# Patient Record
Sex: Male | Born: 1950 | Race: White | Hispanic: No | Smoking: Never smoker
Health system: Southern US, Community
[De-identification: ages and names within clinical notes are randomized; demographics above are authoritative.]

## PROBLEM LIST (undated history)

## (undated) DIAGNOSIS — I10 Essential (primary) hypertension: Secondary | ICD-10-CM

## (undated) DIAGNOSIS — E781 Pure hyperglyceridemia: Secondary | ICD-10-CM

## (undated) HISTORY — PX: NO PAST SURGERIES: SHX2092

---

## 2016-02-29 ENCOUNTER — Ambulatory Visit
Admission: EM | Admit: 2016-02-29 | Discharge: 2016-02-29 | Disposition: A | Discharge status: Home / Self Care | Payer: Medicare Other | Attending: Emergency Medicine | Admitting: Emergency Medicine

## 2016-02-29 DIAGNOSIS — H6123 Impacted cerumen, bilateral: Secondary | ICD-10-CM | POA: Diagnosis not present

## 2016-02-29 HISTORY — DX: Essential (primary) hypertension: I10

## 2016-02-29 HISTORY — DX: Pure hyperglyceridemia: E78.1

## 2016-02-29 NOTE — ED Triage Notes (Signed)
Patient complains of left ear fullness. Patient states that he can barely hear out of this ear. Patient denies pain. Patient states that this occurred this morning upon wakening.

## 2016-02-29 NOTE — ED Provider Notes (Signed)
[facility] ____________________________________________  Time seen: Approximately 5:28 PM  I have reviewed the triage vital signs and the nursing notes.   HISTORY  Chief Complaint Ear Fullness   HPI Pedro Mccoy is a 65 y.o. male presents with complaint of left ear fullness and clogged feeling that was present upon awakening today. Patient reports he has a history of cerumen impaction and states that his current sensation is consistent with the same. Denies any pain. Denies any fevers. Denies any recent cough, congestion, sore throat. Reports continues to eat and drink well. Denies any dizziness. Denies headache, tinnitus, neck pain, chest pain or shortness breath, drainage or other complaints. Patient reports feels well otherwise.   Past Medical History:  Diagnosis Date  . Hypertension   . Hypertriglyceridemia .    There are no active problems to display for this patient.   Past Surgical History:  Procedure Laterality Date  . NO PAST SURGERIES     No current facility-administered medications for this encounter.   Current Outpatient Prescriptions:  .  NIFEdipine (PROCARDIA-XL/ADALAT CC) 30 MG 24 hr tablet, Take 30 mg by mouth daily., Disp: , Rfl:  .  rosuvastatin (CRESTOR) 20 MG tablet, Take 20 mg by mouth daily., Disp: , Rfl:  .  telmisartan (MICARDIS) 20 MG tablet, Take 20 mg by mouth daily., Disp: , Rfl:   Allergies Ace inhibitors and Codeine  History reviewed. No pertinent family history.  Social History Social History  Substance Use Topics  . Smoking status: Never Smoker  . Smokeless tobacco: Never Used  . Alcohol use No    Review of Systems Constitutional: No fever/chills Eyes: No visual changes. ENT: No sore throat.As above. Cardiovascular: Denies chest pain. Respiratory: Denies shortness of breath. Gastrointestinal: No abdominal pain.  No nausea, no vomiting.  No diarrhea.  No constipation. Genitourinary: Negative for  dysuria. Musculoskeletal: Negative for back pain. Skin: Negative for rash. Neurological: Negative for headaches, focal weakness or numbness.  10-point ROS otherwise negative.  ____________________________________________   PHYSICAL EXAM:  VITAL SIGNS: ED Triage Vitals  Enc Vitals Group     BP 02/29/16 1644 119/80     Pulse Rate 02/29/16 1644 63     Resp 02/29/16 1644 16     Temp 02/29/16 1644 98.4 F (36.9 C)     Temp Source 02/29/16 1644 Oral     SpO2 02/29/16 1644 96 %     Weight 02/29/16 1643 225 lb (102.1 kg)     Height 02/29/16 1643 6\' 1"  (1.854 m)     Head Circumference --      Peak Flow --      Pain Score 02/29/16 1644 0     Pain Loc --      Pain Edu? --      Excl. in [place]? --     Constitutional: Alert and oriented. Well appearing and in no acute distress. Eyes: Conjunctivae are normal. PERRL. EOMI. Head: Atraumatic. No sinus tenderness to palpation. No swelling. No erythema.  Ears: Left: Total cerumen impaction present. Post irrigation and cerumen removal, canal clear, no erythema, normal TMs and nontender. Right: Moderate cerumen impaction present, no erythema, no exudate, normal TM appearance.  Nose: No nasal congestion or rhinorrhea.  Mouth/Throat: Mucous membranes are moist. No pharyngeal erythema. No tonsillar swelling or exudate.  Neck: No stridor.  No cervical spine tenderness to palpation. Hematological/Lymphatic/Immunilogical: No cervical lymphadenopathy. Cardiovascular: Normal rate, regular rhythm. Grossly normal heart sounds.  Good peripheral circulation. Respiratory: Normal respiratory effort.  No  retractions. Lungs CTAB. No wheezes, rales or rhonchi. Good air movement.  Gastrointestinal: Soft and nontender.  Musculoskeletal:  No cervical, thoracic or lumbar tenderness to palpation. Neurologic:  Normal speech and language. No gross focal neurologic deficits are appreciated. No gait instability. Skin:  Skin is warm, dry and intact. No rash  noted. Psychiatric: Mood and affect are normal. Speech and behavior are normal.  ___________________________________________   LABS (all labs ordered are listed, but only abnormal results are displayed)  Labs Reviewed - No data to display   PROCEDURES Procedures   Bilateral Ceruminosis is noted.  Wax is removed by irrigation. Instructions for home care to prevent wax buildup are given. __________________________________________   INITIAL IMPRESSION / ASSESSMENT AND PLAN / ED COURSE  Pertinent labs & imaging results that were available during my care of the patient were reviewed by me and considered in my medical decision making (see chart for details).   Well-appearing patient. No acute distress. Presents for complaint of left ear clogged sensation. Patient with noted bilateral cerumen impaction. Post irrigation, canals clear. Patient reports feeling much better. Patient reports clogged sensation has fully resolved. Encouraged supportive care.  Discussed follow up with Primary care physician this week. Discussed follow up and return parameters including no resolution or any worsening concerns. Patient verbalized understanding and agreed to plan.   ____________________________________________   FINAL CLINICAL IMPRESSION(S) / ED DIAGNOSES  Final diagnoses:  Cerumen impaction, bilateral     Discharge Medication List as of 02/29/2016  5:58 PM      Note: This dictation was prepared with Dragon dictation along with smaller phrase technology. Any transcriptional errors that result from this process are unintentional.    Clinical Course      Marylene Land, NP 03/02/16 2008

## 2017-03-04 ENCOUNTER — Ambulatory Visit: Payer: Medicare Other | Admitting: Physical Therapy

## 2017-03-09 ENCOUNTER — Ambulatory Visit: Payer: Medicare Other | Admitting: Physical Therapy

## 2017-09-04 ENCOUNTER — Emergency Department
Admission: EM | Admit: 2017-09-04 | Discharge: 2017-09-04 | Disposition: A | Discharge status: Home / Self Care | Payer: Medicare Other | Attending: Emergency Medicine | Admitting: Emergency Medicine

## 2017-09-04 ENCOUNTER — Other Ambulatory Visit: Payer: Self-pay

## 2017-09-04 DIAGNOSIS — Z79899 Other long term (current) drug therapy: Secondary | ICD-10-CM | POA: Insufficient documentation

## 2017-09-04 DIAGNOSIS — F329 Major depressive disorder, single episode, unspecified: Secondary | ICD-10-CM | POA: Diagnosis not present

## 2017-09-04 DIAGNOSIS — I10 Essential (primary) hypertension: Secondary | ICD-10-CM | POA: Diagnosis not present

## 2017-09-04 DIAGNOSIS — F32A Depression, unspecified: Secondary | ICD-10-CM

## 2017-09-04 DIAGNOSIS — F419 Anxiety disorder, unspecified: Secondary | ICD-10-CM | POA: Diagnosis present

## 2017-09-04 LAB — URINALYSIS, COMPLETE (UACMP) WITH MICROSCOPIC
BILIRUBIN URINE: NEGATIVE
Bacteria, UA: NONE SEEN
Glucose, UA: NEGATIVE mg/dL
Hgb urine dipstick: NEGATIVE
Ketones, ur: NEGATIVE mg/dL
Leukocytes, UA: NEGATIVE
Nitrite: NEGATIVE
Protein, ur: NEGATIVE mg/dL
RBC / HPF: NONE SEEN RBC/hpf (ref 0–5)
SPECIFIC GRAVITY, URINE: 1.003 — AB (ref 1.005–1.030)
SQUAMOUS EPITHELIAL / LPF: NONE SEEN
WBC UA: NONE SEEN WBC/hpf (ref 0–5)
pH: 7 (ref 5.0–8.0)

## 2017-09-04 LAB — URINE DRUG SCREEN, QUALITATIVE (ARMC ONLY)
Amphetamines, Ur Screen: NOT DETECTED
Barbiturates, Ur Screen: NOT DETECTED
Benzodiazepine, Ur Scrn: NOT DETECTED
CANNABINOID 50 NG, UR ~~LOC~~: NOT DETECTED
COCAINE METABOLITE, UR ~~LOC~~: NOT DETECTED
MDMA (ECSTASY) UR SCREEN: NOT DETECTED
Methadone Scn, Ur: NOT DETECTED
OPIATE, UR SCREEN: NOT DETECTED
PHENCYCLIDINE (PCP) UR S: NOT DETECTED
Tricyclic, Ur Screen: NOT DETECTED

## 2017-09-04 LAB — CBC WITH DIFFERENTIAL/PLATELET
BASOS ABS: 0.1 10*3/uL (ref 0–0.1)
Basophils Relative: 1 %
EOS PCT: 5 %
Eosinophils Absolute: 0.5 10*3/uL (ref 0–0.7)
HEMATOCRIT: 47.9 % (ref 40.0–52.0)
Hemoglobin: 16.5 g/dL (ref 13.0–18.0)
LYMPHS PCT: 17 %
Lymphs Abs: 1.6 10*3/uL (ref 1.0–3.6)
MCH: 28.9 pg (ref 26.0–34.0)
MCHC: 34.4 g/dL (ref 32.0–36.0)
MCV: 83.9 fL (ref 80.0–100.0)
MONO ABS: 0.7 10*3/uL (ref 0.2–1.0)
Monocytes Relative: 8 %
Neutro Abs: 6.8 10*3/uL — ABNORMAL HIGH (ref 1.4–6.5)
Neutrophils Relative %: 69 %
Platelets: 235 10*3/uL (ref 150–440)
RBC: 5.71 MIL/uL (ref 4.40–5.90)
RDW: 12.7 % (ref 11.5–14.5)
WBC: 9.8 10*3/uL (ref 3.8–10.6)

## 2017-09-04 LAB — COMPREHENSIVE METABOLIC PANEL
ALT: 26 U/L (ref 17–63)
AST: 26 U/L (ref 15–41)
Albumin: 4.2 g/dL (ref 3.5–5.0)
Alkaline Phosphatase: 81 U/L (ref 38–126)
Anion gap: 10 (ref 5–15)
BILIRUBIN TOTAL: 0.6 mg/dL (ref 0.3–1.2)
BUN: 12 mg/dL (ref 6–20)
CO2: 24 mmol/L (ref 22–32)
CREATININE: 0.95 mg/dL (ref 0.61–1.24)
Calcium: 9.2 mg/dL (ref 8.9–10.3)
Chloride: 101 mmol/L (ref 101–111)
Glucose, Bld: 142 mg/dL — ABNORMAL HIGH (ref 65–99)
Potassium: 3.9 mmol/L (ref 3.5–5.1)
Sodium: 135 mmol/L (ref 135–145)
TOTAL PROTEIN: 7.1 g/dL (ref 6.5–8.1)

## 2017-09-04 LAB — TROPONIN I: Troponin I: <0.03 ng/mL (ref <0.03)

## 2017-09-04 LAB — ETHANOL

## 2017-09-04 LAB — ACETAMINOPHEN LEVEL

## 2017-09-04 LAB — SALICYLATE LEVEL: Salicylate Lvl: <7 mg/dL (ref 2.8–30.0)

## 2017-09-04 MED ORDER — HYDROXYZINE HCL 25 MG PO TABS
50.0000 mg | ORAL_TABLET | Freq: Once | ORAL | Status: AC
  Administered 2017-09-04: 50 mg via ORAL
  Filled 2017-09-04: qty 2

## 2017-09-04 MED ORDER — LORAZEPAM 0.5 MG PO TABS: 0.5000 mg | ORAL_TABLET | ORAL | 0 refills | Status: DC | PRN

## 2017-09-04 NOTE — ED Triage Notes (Addendum)
Pt arrives to ED via [facility] from home with c/o anxiety. EMS states that pt originally called 911 with c/o lower abdominal pain x4 days, but also reported to EMS that the pain is only present "when the pt is alone". Pt states the pain goes away when he's "not by myself". Pt reports taking several medications for anxiety; no reports of SI/HI or AVH. Pt denies any c/o pain at this time; no c/o N/V/D, no fever, CP or SHOB.

## 2017-09-04 NOTE — ED Provider Notes (Signed)
[facility] Emergency Department Provider Note   ____________________________________________   First MD Initiated Contact with Patient 09/04/17 0202     (approximate)  I have reviewed the triage vital signs and the nursing notes.   HISTORY  Chief Complaint Anxiety    HPI Pedro Mccoy is a 67 y.o. male brought to the ED via EMS from home with a chief complaint of anxiety and depression.  Patient reports abdominal pain only when he closes his eyes.  This week is the anniversary of his mother's death and he is feeling particularly sad and anxious because he lives alone; wife died 5 years ago.  Denies active SI/HI/AH/VH.  Voices no complaints at this time.  Specifically, denies recent fever, chills, chest pain, shortness of breath, nausea, vomiting, dysuria, diarrhea.  Denies recent travel or trauma.   Past Medical History:  Diagnosis Date  . Hypertension   . Hypertriglyceridemia .    There are no active problems to display for this patient.   Past Surgical History:  Procedure Laterality Date  . NO PAST SURGERIES      Prior to Admission medications   Medication Sig Start Date End Date Taking? Authorizing Provider  NIFEdipine (PROCARDIA-XL/ADALAT CC) 30 MG 24 hr tablet Take 30 mg by mouth daily.    [provider]  rosuvastatin (CRESTOR) 20 MG tablet Take 20 mg by mouth daily.    [provider]  telmisartan (MICARDIS) 20 MG tablet Take 20 mg by mouth daily.    [provider]    Allergies Ace inhibitors and Codeine  No family history on file.  Social History Social History   Tobacco Use  . Smoking status: Never Smoker  . Smokeless tobacco: Never Used  Substance Use Topics  . Alcohol use: No  . Drug use: No    Review of Systems  Constitutional: No fever/chills. Eyes: No visual changes. ENT: No sore throat. Cardiovascular: Denies chest pain. Respiratory: Denies shortness of breath. Gastrointestinal: No  abdominal pain.  No nausea, no vomiting.  No diarrhea.  No constipation. Genitourinary: Negative for dysuria. Musculoskeletal: Negative for back pain. Skin: Negative for rash. Neurological: Negative for headaches, focal weakness or numbness. Psychiatric:Positive for anxiety and depression.  ____________________________________________   PHYSICAL EXAM:  VITAL SIGNS: ED Triage Vitals  Enc Vitals Group     BP 09/04/17 0051 (!) 161/93     Pulse Rate 09/04/17 0051 78     Resp 09/04/17 0051 20     Temp 09/04/17 0051 98.6 F (37 C)     Temp Source 09/04/17 0051 Oral     SpO2 09/04/17 0051 95 %     Weight 09/04/17 0050 235 lb (106.6 kg)     Height 09/04/17 0050 6\' 2"  (1.88 m)     Head Circumference --      Peak Flow --      Pain Score --      Pain Loc --      Pain Edu? --      Excl. in [place]? --     Constitutional: Alert and oriented.  Disheveled appearing and in no acute distress. Eyes: Conjunctivae are normal. PERRL. EOMI. Head: Atraumatic. Nose: No congestion/rhinnorhea. Mouth/Throat: Mucous membranes are moist.  Oropharynx non-erythematous. Neck: No stridor.  No carotid bruits. Cardiovascular: Normal rate, regular rhythm. Grossly normal heart sounds.  Good peripheral circulation. Respiratory: Normal respiratory effort.  No retractions. Lungs CTAB. Gastrointestinal: Soft and nontender to light and deep palpation. No distention. No abdominal bruits.  No CVA tenderness. Musculoskeletal: No lower extremity tenderness nor edema.  No joint effusions. Neurologic:  Normal speech and language. No gross focal neurologic deficits are appreciated. No gait instability. Skin:  Skin is warm, dry and intact. No rash noted. Psychiatric: Mood and affect are flat. Speech and behavior are normal.  ____________________________________________   LABS (all labs ordered are listed, but only abnormal results are displayed)  Labs Reviewed  CBC WITH DIFFERENTIAL/PLATELET - Abnormal; Notable for the  following components:      Result Value   Neutro Abs 6.8 (*)    All other components within normal limits  COMPREHENSIVE METABOLIC PANEL - Abnormal; Notable for the following components:   Glucose, Bld 142 (*)    All other components within normal limits  ACETAMINOPHEN LEVEL - Abnormal; Notable for the following components:   Acetaminophen (Tylenol), Serum <10 (*)    All other components within normal limits  URINALYSIS, COMPLETE (UACMP) WITH MICROSCOPIC - Abnormal; Notable for the following components:   Color, Urine STRAW (*)    APPearance CLEAR (*)    Specific Gravity, Urine 1.003 (*)    All other components within normal limits  ETHANOL  SALICYLATE LEVEL  TROPONIN I  URINE DRUG SCREEN, QUALITATIVE ([facility] ONLY)   ____________________________________________  EKG  ED ECG REPORT I, Charvez Voorhies J, the attending physician, personally viewed and interpreted this ECG.   Date: 09/04/2017  EKG Time: 0218  Rate: 69  Rhythm: normal EKG, normal sinus rhythm  Axis: LAD  Intervals:left anterior fascicular block  ST&T Change: Nonspecific  ____________________________________________  RADIOLOGY  ED MD interpretation: None  Official radiology report(s): No results found.  ____________________________________________   PROCEDURES  Procedure(s) performed: None  Procedures  Critical Care performed: No  ____________________________________________   INITIAL IMPRESSION / ASSESSMENT AND PLAN / ED COURSE  As part of my medical decision making, I reviewed the following data within the [place] notes reviewed and incorporated, Labs reviewed, EKG interpreted, Old chart reviewed, Radiograph reviewed, A consult was requested and obtained from this/these consultant(s) Psychiatry and Notes from prior ED visits   67 year old male with anxiety who presents with anxiety and depression surrounding the anniversary of his mother's death.  Voices no active  SI/HI/AH/VH.  No abdominal tenderness on examination.  Will obtain screening toxicological lab work and urine, consult [facility] and [facility] psychiatry for evaluations.  Patient currently takes Ativan at home; will administer Vistaril.  Clinical Course as of Sep 05 518  Fri Sep 04, 2017  0259 Laboratory results unremarkable.  At this time patient is medically cleared pending psychiatry evaluation.  He remains under voluntary status.  [JS]  9211 Patient was evaluated by [facility] psychiatrist Dr. Bobbe Medico who deems patient psychiatrically stable for discharge home with outpatient follow-up.  Patient does not meet criteria for IVC.  Recommends Ativan 0.5 mg every 4 hours for anxiety.  With strict return precautions given.  Patient verbalizes understanding and agrees with plan of care.  [JS]    Clinical Course User Index [JS] Paulette Blanch, MD     ____________________________________________   FINAL CLINICAL IMPRESSION(S) / ED DIAGNOSES  Final diagnoses:  Anxiety  Depression, unspecified depression type     ED Discharge Orders    None       Note:  This document was prepared using Dragon voice recognition software and may include unintentional dictation errors.    Paulette Blanch, MD 09/04/17 [phone]

## 2017-09-04 NOTE — Discharge Instructions (Signed)
You may take Ativan 0.5 mg every 4 hours as needed for anxiety.  Return to the ER for worsening symptoms, feelings of hurting yourself or others, or other concerns.

## 2017-09-04 NOTE — ED Notes (Signed)
SOC in progress at this time.  

## 2018-09-09 ENCOUNTER — Emergency Department: Payer: Medicare Other

## 2018-09-09 ENCOUNTER — Emergency Department
Admission: EM | Admit: 2018-09-09 | Discharge: 2018-09-10 | Disposition: A | Discharge status: Home / Self Care | Payer: Medicare Other | Attending: Emergency Medicine | Admitting: Emergency Medicine

## 2018-09-09 DIAGNOSIS — R4182 Altered mental status, unspecified: Secondary | ICD-10-CM | POA: Diagnosis present

## 2018-09-09 DIAGNOSIS — Z79899 Other long term (current) drug therapy: Secondary | ICD-10-CM | POA: Insufficient documentation

## 2018-09-09 DIAGNOSIS — Z008 Encounter for other general examination: Secondary | ICD-10-CM | POA: Insufficient documentation

## 2018-09-09 DIAGNOSIS — F203 Undifferentiated schizophrenia: Secondary | ICD-10-CM | POA: Diagnosis not present

## 2018-09-09 DIAGNOSIS — I10 Essential (primary) hypertension: Secondary | ICD-10-CM | POA: Diagnosis not present

## 2018-09-09 DIAGNOSIS — F209 Schizophrenia, unspecified: Secondary | ICD-10-CM | POA: Diagnosis present

## 2018-09-09 LAB — COMPREHENSIVE METABOLIC PANEL
ALK PHOS: 84 U/L (ref 38–126)
ALT: 20 U/L (ref 0–44)
AST: 21 U/L (ref 15–41)
Albumin: 4.5 g/dL (ref 3.5–5.0)
Anion gap: 10 (ref 5–15)
BILIRUBIN TOTAL: 0.7 mg/dL (ref 0.3–1.2)
BUN: 8 mg/dL (ref 8–23)
CALCIUM: 9 mg/dL (ref 8.9–10.3)
CO2: 24 mmol/L (ref 22–32)
CREATININE: 0.9 mg/dL (ref 0.61–1.24)
Chloride: 106 mmol/L (ref 98–111)
GFR calc non Af Amer: >60 mL/min (ref >60)
Glucose, Bld: 106 mg/dL — ABNORMAL HIGH (ref 70–99)
Potassium: 3.6 mmol/L (ref 3.5–5.1)
SODIUM: 140 mmol/L (ref 135–145)
Total Protein: 6.6 g/dL (ref 6.5–8.1)

## 2018-09-09 LAB — CBC
HEMATOCRIT: 46.8 % (ref 39.0–52.0)
HEMOGLOBIN: 15.9 g/dL (ref 13.0–17.0)
MCH: 28.9 pg (ref 26.0–34.0)
MCHC: 34 g/dL (ref 30.0–36.0)
MCV: 85.1 fL (ref 80.0–100.0)
Platelets: 214 10*3/uL (ref 150–400)
RBC: 5.5 MIL/uL (ref 4.22–5.81)
RDW: 12.4 % (ref 11.5–15.5)
WBC: 7.8 10*3/uL (ref 4.0–10.5)
nRBC: 0 % (ref 0.0–0.2)

## 2018-09-09 LAB — URINE DRUG SCREEN, QUALITATIVE (ARMC ONLY)
Amphetamines, Ur Screen: NOT DETECTED
BARBITURATES, UR SCREEN: NOT DETECTED
BENZODIAZEPINE, UR SCRN: NOT DETECTED
Cannabinoid 50 Ng, Ur ~~LOC~~: NOT DETECTED
Cocaine Metabolite,Ur ~~LOC~~: NOT DETECTED
MDMA (ECSTASY) UR SCREEN: NOT DETECTED
Methadone Scn, Ur: NOT DETECTED
Opiate, Ur Screen: NOT DETECTED
Phencyclidine (PCP) Ur S: NOT DETECTED
Tricyclic, Ur Screen: NOT DETECTED

## 2018-09-09 LAB — URINALYSIS, COMPLETE (UACMP) WITH MICROSCOPIC
Bacteria, UA: NONE SEEN
Bilirubin Urine: NEGATIVE
Glucose, UA: NEGATIVE mg/dL
HGB URINE DIPSTICK: NEGATIVE
KETONES UR: NEGATIVE mg/dL
LEUKOCYTE UA: NEGATIVE
NITRITE: NEGATIVE
PH: 6 (ref 5.0–8.0)
PROTEIN: NEGATIVE mg/dL
Specific Gravity, Urine: 1.004 — ABNORMAL LOW (ref 1.005–1.030)
Squamous Epithelial / LPF: NONE SEEN (ref 0–5)
WBC, UA: NONE SEEN WBC/hpf (ref 0–5)

## 2018-09-09 LAB — TROPONIN I: Troponin I: <0.03 ng/mL (ref <0.03)

## 2018-09-09 LAB — ETHANOL

## 2018-09-09 NOTE — ED Notes (Signed)
Called a number in pt phone on behalf of pt in attempt to find him a ride. No one answered.

## 2018-09-09 NOTE — ED Notes (Signed)
This RN called [employer] to request a welfare check. Asked them to go to pt home address and see if anyone was there that could come pick up the pt.

## 2018-09-09 NOTE — ED Provider Notes (Signed)
[facility] Emergency Department Provider Note  Time seen: 7:46 PM  I have reviewed the triage vital signs and the nursing notes.   HISTORY  Chief Complaint No chief complaint on file.    HPI Pedro Mccoy is a 68 y.o. male with a past medical history of hypertension, hyperlipidemia, diabetes, bipolar/schizoaffective, presents to the emergency department for possible altered mental status.  According EMS they were called by the patient's neighbor as the patient was wandering around his front yard.  They believe the patient to be acting abnormal however EMS states there is also another neighbor when they arrived he said that he appeared to be acting at his baseline per them.  Per care everywhere record review patient has bipolar and schizophrenia.  Here the patient overall appears well, slightly slurred speech, but is answering questions appropriately and following commands.  No acute distress.  Has no complaints at this time.  Patient states he lives with his parents.   Past Medical History:  Diagnosis Date  . Hypertension   . Hypertriglyceridemia .    There are no active problems to display for this patient.   Past Surgical History:  Procedure Laterality Date  . NO PAST SURGERIES      Prior to Admission medications   Medication Sig Start Date End Date Taking? Authorizing Provider  LORazepam (ATIVAN) 0.5 MG tablet Take 1 tablet (0.5 mg total) by mouth every 4 (four) hours as needed for anxiety. 09/04/17   Paulette Blanch, MD  NIFEdipine (PROCARDIA-XL/ADALAT CC) 30 MG 24 hr tablet Take 30 mg by mouth daily.    [provider]  rosuvastatin (CRESTOR) 20 MG tablet Take 20 mg by mouth daily.    [provider]  telmisartan (MICARDIS) 20 MG tablet Take 20 mg by mouth daily.    [provider]    Allergies  Allergen Reactions  . Ace Inhibitors   . Codeine     No family history on file.  Social History Social History   Tobacco Use   . Smoking status: Never Smoker  . Smokeless tobacco: Never Used  Substance Use Topics  . Alcohol use: No  . Drug use: No    Review of Systems Constitutional: Negative for fever Cardiovascular: Negative for chest pain. Respiratory: Negative for shortness of breath. Gastrointestinal: Negative for abdominal pain, vomiting and diarrhea. Genitourinary: Negative for urinary compaints Musculoskeletal: Negative for musculoskeletal complaints Skin: Negative for skin complaints  Neurological: Negative for headache All other ROS negative  ____________________________________________   PHYSICAL EXAM:  VITAL SIGNS: ED Triage Vitals  Enc Vitals Group     BP      Pulse      Resp      Temp      Temp src      SpO2      Weight      Height      Head Circumference      Peak Flow      Pain Score      Pain Loc      Pain Edu?      Excl. in [place]?     Constitutional: Patient is awake and alert, no distress. Eyes: Normal exam ENT   Head: Normocephalic and atraumatic   Mouth/Throat: Mucous membranes are moist. Cardiovascular: Normal rate, regular rhythm. No murmur Respiratory: Normal respiratory effort without tachypnea nor retractions. Breath sounds are clear  Gastrointestinal: Soft and nontender. No distention.   Musculoskeletal: Nontender with normal range of motion  in all extremities.  Neurologic: Patient does have somewhat slurred speech, unclear what his baseline is.  Answering questions and following commands appropriately.  Equal grip strengths, 5/5 motor in all extremities. Skin:  Skin is warm, dry and intact.  Psychiatric: Mood and affect are normal.   ____________________________________________    EKG  EKG viewed and interpreted by myself shows a normal sinus rhythm at 71 bpm with a narrow QRS, left axis deviation, largely normal intervals nonspecific ST changes.  ____________________________________________    RADIOLOGY  CT head negative for acute  abnormality.  ____________________________________________   INITIAL IMPRESSION / ASSESSMENT AND PLAN / ED COURSE  Pertinent labs & imaging results that were available during my care of the patient were reviewed by me and considered in my medical decision making (see chart for details).  Patient presents to the emergency department for possible altered mental status.  Currently the patient appears well he does have slightly slurred speech it is unclear if this is baseline.  Per record review on care everywhere patient is schizophrenia and bipolar disorder.  Patient states he lives with his parents.  However as no family is available for history nor are they listed besides an aunt we will check labs, urine and a CT scan head as a precaution.  I discussed this with the patient who is agreeable to this plan of care.  Patient's lab work is largely within normal limits, CT scan of the head is negative.  Patient continues to appear well, answering all questions appropriately, has no complaints at this time.  We are waiting for family members to arrive to verify patient's baseline and to help patient get back home.  Patient states he lives with his parents but we have not been able to reach them as of yet, we have left messages and are waiting to hear back.  ____________________________________________   FINAL CLINICAL IMPRESSION(S) / ED DIAGNOSES  Confusion   Harvest Dark, MD 09/09/18 2142

## 2018-09-09 NOTE — Discharge Instructions (Addendum)
Please follow-up with your doctor for recheck/reevaluation.  Return to the emergency department for any symptoms personally concerning to yourself.

## 2018-09-09 NOTE — ED Notes (Signed)
Called pt number we have on file to see if a family member could come get him. No one answered. This RN left a message.

## 2018-09-09 NOTE — ED Triage Notes (Signed)
Pt arrived from home via EMS with complaints of altered mental status. Pt neighbor called EMS stating that he was out walking around as if he was wondering and stated that his speech was slurred.  Pt denies any pain. EMS stated that their stroke screen was negative. VS per EMS  CBG-116 BP-144/90 HR-74 R-11. EMS placed 20 in left forearm.

## 2018-09-09 NOTE — ED Notes (Signed)
[employer] called back and let this RN know that they knocked several times and no one came to the door. MD made aware

## 2018-09-10 DIAGNOSIS — R4182 Altered mental status, unspecified: Secondary | ICD-10-CM | POA: Diagnosis present

## 2018-09-10 DIAGNOSIS — F203 Undifferentiated schizophrenia: Secondary | ICD-10-CM

## 2018-09-10 DIAGNOSIS — F209 Schizophrenia, unspecified: Secondary | ICD-10-CM | POA: Diagnosis present

## 2018-09-10 MED ORDER — NIFEDIPINE ER OSMOTIC RELEASE 30 MG PO TB24
30.0000 mg | ORAL_TABLET | Freq: Every day | ORAL | Status: DC
  Administered 2018-09-10: 30 mg via ORAL
  Filled 2018-09-10: qty 1

## 2018-09-10 MED ORDER — ALPRAZOLAM 0.5 MG PO TABS: 2.0000 mg | ORAL_TABLET | Freq: Every evening | ORAL | Status: DC | PRN

## 2018-09-10 MED ORDER — IRBESARTAN 75 MG PO TABS
75.0000 mg | ORAL_TABLET | Freq: Every day | ORAL | Status: DC
  Administered 2018-09-10: 75 mg via ORAL
  Filled 2018-09-10: qty 1

## 2018-09-10 MED ORDER — OMEGA-3-ACID ETHYL ESTERS 1 G PO CAPS
1.0000 g | ORAL_CAPSULE | Freq: Every day | ORAL | Status: DC
  Administered 2018-09-10: 1 g via ORAL
  Filled 2018-09-10: qty 1

## 2018-09-10 MED ORDER — ROSUVASTATIN CALCIUM 20 MG PO TABS
20.0000 mg | ORAL_TABLET | Freq: Every day | ORAL | Status: DC
  Filled 2018-09-10: qty 1

## 2018-09-10 MED ORDER — FLUOXETINE HCL 20 MG PO CAPS
20.0000 mg | ORAL_CAPSULE | Freq: Every day | ORAL | Status: DC
  Administered 2018-09-10: 20 mg via ORAL
  Filled 2018-09-10: qty 1

## 2018-09-10 NOTE — Consult Note (Signed)
[facility] Face-to-Face Psychiatry Consult   Reason for Consult: Consult to follow-up with this 68 year old man with schizophrenia who was brought to the hospital after being found acting strangely outdoors Referring Physician: Corky Downs Patient Identification: Pedro Mccoy MRN:  [id number] Principal Diagnosis: Schizophrenia [facility]) Diagnosis:  Principal Problem:   Schizophrenia ([place]) Active Problems:   Altered mental state   Total Time spent with patient: 1 hour  Subjective:   Pedro Mccoy is a 68 y.o. male patient admitted with "somebody called 911".  HPI: Patient seen chart reviewed.  History I got from the patient is consistent with what has been reported so far.  Somebody called 911 because they said the patient was outdoors acting strangely.  Patient cooperated with EMS and voluntarily came to the emergency room.  Since being here he has not been aggressive threatening violent or suicidal.  Patient tells me that all of this happened several days ago.  He is a little confused about the days and how long he has been here but otherwise gets the time course correct.  He tells me that people at his apartment complex were playing "adult hide and seek" outside.  He gives this as the reason why he was outdoors.  Patient denies any recent acute psychiatric symptoms.  He denies that his mood has been sad down or depressed.  Denies any auditory or visual hallucinations.  Denies any feelings of paranoia.  Denies suicidal or homicidal thoughts.  Patient states that he has not been drinking or using drugs.  He says he has been compliant with his prescribed medications and seeing his psychiatrist and therapist regularly.  Social history: Patient says he lives with his stepmother.  Multiple attempts have been made here to get someone to answer the phone or come to the door where he lives so far without success.  Last time he was evaluated in the emergency room he was living by himself.  He says the change has been  recent.  Patient says he is "dealing with grief" because of the death of his mother and his wife both of which happened more than a year ago.  Patient does not work outside the home.  Says that he spends most of his time "doing quiet things" like gardening.  Medical history: History of high blood pressure and elevated triglycerides  Substance abuse history: Denies alcohol or drug abuse no past history of substance abuse  Past Psychiatric History: Patient has a history of schizophrenia.  He tells me that he has had hospitalizations in the past but the most recent one was more than 10 years ago.  Denies any past suicide attempts.  Says he has been compliant with his prescriptions of Prozac and Haldol for years.  Risk to Self:   Risk to Others:   Prior Inpatient Therapy:   Prior Outpatient Therapy:    Past Medical History:  Past Medical History:  Diagnosis Date  . Hypertension   . Hypertriglyceridemia .    Past Surgical History:  Procedure Laterality Date  . NO PAST SURGERIES     Family History: History reviewed. No pertinent family history. Family Psychiatric  History: Denies any Social History:  Social History   Substance and Sexual Activity  Alcohol Use No     Social History   Substance and Sexual Activity  Drug Use No    Social History   Socioeconomic History  . Marital status: Widowed    Spouse name: Not on file  . Number of children: Not on file  .  Years of education: Not on file  . Highest education level: Not on file  Occupational History  . Not on file  Social Needs  . Financial resource strain: Not on file  . Food insecurity:    Worry: Not on file    Inability: Not on file  . Transportation needs:    Medical: Not on file    Non-medical: Not on file  Tobacco Use  . Smoking status: Never Smoker  . Smokeless tobacco: Never Used  Substance and Sexual Activity  . Alcohol use: No  . Drug use: No  . Sexual activity: Not on file  Lifestyle  . Physical  activity:    Days per week: Not on file    Minutes per session: Not on file  . Stress: Not on file  Relationships  . Social connections:    Talks on phone: Not on file    Gets together: Not on file    Attends religious service: Not on file    Active member of club or organization: Not on file    Attends meetings of clubs or organizations: Not on file    Relationship status: Not on file  Other Topics Concern  . Not on file  Social History Narrative  . Not on file   Additional Social History:    Allergies:   Allergies  Allergen Reactions  . Pioglitazone Nausea Only  . Ace Inhibitors   . Codeine   . Sitagliptin Nausea And Vomiting    Labs:  Results for orders placed or performed during the hospital encounter of 09/09/18 (from the past 48 hour(s))  Urine Drug Screen, Qualitative ([facility] only)     Status: None   Collection Time: 09/09/18  7:47 PM  Result Value Ref Range   Tricyclic, Ur Screen NONE DETECTED NONE DETECTED   Amphetamines, Ur Screen NONE DETECTED NONE DETECTED   MDMA (Ecstasy)Ur Screen NONE DETECTED NONE DETECTED   Cocaine Metabolite,Ur [place] NONE DETECTED NONE DETECTED   Opiate, Ur Screen NONE DETECTED NONE DETECTED   Phencyclidine (PCP) Ur S NONE DETECTED NONE DETECTED   Cannabinoid 50 Ng, Ur [place] NONE DETECTED NONE DETECTED   Barbiturates, Ur Screen NONE DETECTED NONE DETECTED   Benzodiazepine, Ur Scrn NONE DETECTED NONE DETECTED   Methadone Scn, Ur NONE DETECTED NONE DETECTED    Comment: (NOTE) Tricyclics + metabolites, urine    Cutoff 1000 ng/mL Amphetamines + metabolites, urine  Cutoff 1000 ng/mL MDMA (Ecstasy), urine              Cutoff 500 ng/mL Cocaine Metabolite, urine          Cutoff 300 ng/mL Opiate + metabolites, urine        Cutoff 300 ng/mL Phencyclidine (PCP), urine         Cutoff 25 ng/mL Cannabinoid, urine                 Cutoff 50 ng/mL Barbiturates + metabolites, urine  Cutoff 200 ng/mL Benzodiazepine, urine              Cutoff 200  ng/mL Methadone, urine                   Cutoff 300 ng/mL The urine drug screen provides only a preliminary, unconfirmed analytical test result and should not be used for non-medical purposes. Clinical consideration and professional judgment should be applied to any positive drug screen result due to possible interfering substances. A more specific alternate chemical method must be used in  order to obtain a confirmed analytical result. Gas chromatography / mass spectrometry (GC/MS) is the preferred confirmat ory method. Performed at [facility], [address] [place], [place] [address]   Urinalysis, Complete w Microscopic     Status: Abnormal   Collection Time: 09/09/18  7:47 PM  Result Value Ref Range   Color, Urine STRAW (A) YELLOW   APPearance CLEAR (A) CLEAR   Specific Gravity, Urine 1.004 (L) 1.005 - 1.030   pH 6.0 5.0 - 8.0   Glucose, UA NEGATIVE NEGATIVE mg/dL   Hgb urine dipstick NEGATIVE NEGATIVE   Bilirubin Urine NEGATIVE NEGATIVE   Ketones, ur NEGATIVE NEGATIVE mg/dL   Protein, ur NEGATIVE NEGATIVE mg/dL   Nitrite NEGATIVE NEGATIVE   Leukocytes,Ua NEGATIVE NEGATIVE   RBC / HPF 0-5 0 - 5 RBC/hpf   WBC, UA NONE SEEN 0 - 5 WBC/hpf   Bacteria, UA NONE SEEN NONE SEEN   Squamous Epithelial / LPF NONE SEEN 0 - 5    Comment: Performed at [facility], [address] [place], [place] [address]  CBC     Status: None   Collection Time: 09/09/18  7:51 PM  Result Value Ref Range   WBC 7.8 4.0 - 10.5 K/uL   RBC 5.50 4.22 - 5.81 MIL/uL   Hemoglobin 15.9 13.0 - 17.0 g/dL   HCT 46.8 39.0 - 52.0 %   MCV 85.1 80.0 - 100.0 fL   MCH 28.9 26.0 - 34.0 pg   MCHC 34.0 30.0 - 36.0 g/dL   RDW 12.4 11.5 - 15.5 %   Platelets 214 150 - 400 K/uL   nRBC 0.0 0.0 - 0.2 %    Comment: Performed at [facility], [address] [place], [place] [address]  Comprehensive metabolic panel     Status: Abnormal   Collection Time: 09/09/18  7:51 PM  Result Value Ref  Range   Sodium 140 135 - 145 mmol/L   Potassium 3.6 3.5 - 5.1 mmol/L   Chloride 106 98 - 111 mmol/L   CO2 24 22 - 32 mmol/L   Glucose, Bld 106 (H) 70 - 99 mg/dL   BUN 8 8 - 23 mg/dL   Creatinine, Ser 0.90 0.61 - 1.24 mg/dL   Calcium 9.0 8.9 - 10.3 mg/dL   Total Protein 6.6 6.5 - 8.1 g/dL   Albumin 4.5 3.5 - 5.0 g/dL   AST 21 15 - 41 U/L   ALT 20 0 - 44 U/L   Alkaline Phosphatase 84 38 - 126 U/L   Total Bilirubin 0.7 0.3 - 1.2 mg/dL   GFR calc non Af Amer >60 >60 mL/min   GFR calc Af Amer >60 >60 mL/min   Anion gap 10 5 - 15    Comment: Performed at [facility], [address] [place], [place] [address]  Troponin I - ONCE - STAT     Status: None   Collection Time: 09/09/18  7:51 PM  Result Value Ref Range   Troponin I <0.03 <0.03 ng/mL    Comment: Performed at [facility], [address] [place], [place] [address]  Ethanol     Status: None   Collection Time: 09/09/18  7:51 PM  Result Value Ref Range   Alcohol, Ethyl (B) <10 <10 mg/dL    Comment: (NOTE) Lowest detectable limit for serum alcohol is 10 mg/dL. For medical purposes only. Performed at [facility], [address] [place], [place] [address]     Current  Facility-Administered Medications  Medication Dose Route Frequency Provider Last Rate Last Dose  . ALPRAZolam Duanne Moron) tablet 2 mg  2 mg Oral QHS PRN Merlyn Lot, MD      . FLUoxetine (PROZAC) capsule 20 mg  20 mg Oral Daily Merlyn Lot, MD   20 mg at 09/10/18 1100  . irbesartan (AVAPRO) tablet 75 mg  75 mg Oral Daily Merlyn Lot, MD   75 mg at 09/10/18 1100  . NIFEdipine (PROCARDIA-XL/NIFEDICAL-XL) 24 hr tablet 30 mg  30 mg Oral Daily Merlyn Lot, MD   30 mg at 09/10/18 1100  . omega-3 acid ethyl esters (LOVAZA) capsule 1 g  1 g Oral Daily Merlyn Lot, MD   1 g at 09/10/18 1100  . rosuvastatin (CRESTOR) tablet 20 mg  20 mg Oral Daily Merlyn Lot, MD       Current Outpatient Medications   Medication Sig Dispense Refill  . alprazolam (XANAX) 2 MG tablet Take 2 mg by mouth at bedtime as needed for sleep.    Marland Kitchen FLUoxetine (PROZAC) 20 MG capsule Take 20 mg by mouth daily.    Marland Kitchen NIFEdipine (PROCARDIA-XL/ADALAT CC) 30 MG 24 hr tablet Take 30 mg by mouth daily.    Marland Kitchen omega-3 acid ethyl esters (LOVAZA) 1 g capsule Take 1 g by mouth daily.    . rosuvastatin (CRESTOR) 20 MG tablet Take 20 mg by mouth daily.    Marland Kitchen telmisartan (MICARDIS) 20 MG tablet Take 20 mg by mouth daily.      Musculoskeletal: Strength & Muscle Tone: within normal limits Gait & Station: normal Patient leans: N/A  Psychiatric Specialty Exam: Physical Exam  Nursing note and vitals reviewed. Constitutional: He appears well-developed and well-nourished.  HENT:  Head: Normocephalic and atraumatic.  Eyes: Pupils are equal, round, and reactive to light. Conjunctivae are normal.  Neck: Normal range of motion.  Cardiovascular: Regular rhythm and normal heart sounds.  Respiratory: Effort normal. No respiratory distress.  GI: Soft.  Musculoskeletal: Normal range of motion.  Neurological: He is alert.  Skin: Skin is warm and dry.  Psychiatric: He has a normal mood and affect. His speech is normal and behavior is normal. Judgment and thought content normal. He exhibits abnormal recent memory.    Review of Systems  Constitutional: Negative.   HENT: Negative.   Eyes: Negative.   Respiratory: Negative.   Cardiovascular: Negative.   Gastrointestinal: Negative.   Musculoskeletal: Negative.   Skin: Negative.   Neurological: Negative.   Psychiatric/Behavioral: Negative for depression, hallucinations, memory loss, substance abuse and suicidal ideas. The patient is not nervous/anxious and does not have insomnia.     Blood pressure (!) 168/103, pulse 69, temperature 97.7 F (36.5 C), temperature source Oral, resp. rate 18, height 6\' 2"  (1.88 m), weight 97.5 kg, SpO2 100 %.Body mass index is 27.6 kg/m.  General  Appearance: Casual  Eye Contact:  Good  Speech:  Clear and Coherent  Volume:  Normal  Mood:  Euthymic  Affect:  Congruent  Thought Process:  Goal Directed  Orientation:  Full (Time, Place, and Person)  Thought Content:  Logical  Suicidal Thoughts:  No  Homicidal Thoughts:  No  Memory:  Immediate;   Fair Recent;   Fair Remote;   Fair  Judgement:  Fair  Insight:  Fair  Psychomotor Activity:  Normal  Concentration:  Concentration: Fair  Recall:  [employer] of Knowledge:  Fair  Language:  Fair  Akathisia:  No  Handed:  Right  AIMS (if indicated):  Assets:  Desire for Improvement Housing Physical Health Resilience  ADL's:  Intact  Cognition:  WNL  Sleep:        Treatment Plan Summary: Daily contact with patient to assess and evaluate symptoms and progress in treatment, Medication management and Plan Patient is currently not exhibiting any signs of psychotic thinking or behavior.  Does not appear to be depressed or manic.  No evidence of any dangerous thinking or behavior.  Even if under the worst interpretation he was confused or even delusional about people playing "adult hide and seek" outdoors there does not seem to be anything here that would justify involuntary commitment.  Indeed the patient is not under involuntary commitment.  He tells me that he does not want to be admitted to a psychiatric ward but would prefer to go home.  He agrees to continue outpatient psychiatric treatment at [facility] and current medicine.  Under current circumstances there does not seem to be any justification for keeping him in the ER.  Case reviewed with emergency room doctor.  Disposition: No evidence of imminent risk to self or others at present.   Patient does not meet criteria for psychiatric inpatient admission. Supportive therapy provided about ongoing stressors. Discussed crisis plan, support from social network, calling 911, coming to the Emergency Department, and  calling Suicide Hotline.  Alethia Berthold, MD 09/10/2018 2:11 PM

## 2018-09-10 NOTE — BH Assessment (Signed)
Pt currently under review with [facility]

## 2018-09-10 NOTE — ED Notes (Signed)
Patients blue Guess watch placed in belongings bag

## 2018-09-10 NOTE — ED Notes (Signed)
Patient discharged home, patient received discharge papers. Patient received belongings and verbalized he has received all of his belongings. Patient appropriate and cooperative, Denies SI/HI AVH. Vital signs taken. NAD noted. 

## 2018-09-10 NOTE — BH Assessment (Signed)
Pt has been faxed out to several [facility] facilities. TTS will follow up to determine disposition and placement.

## 2018-09-10 NOTE — ED Provider Notes (Signed)
Cleared for d/c by dr. Bethena Midget, MD 09/10/18 1245

## 2018-09-10 NOTE — ED Notes (Signed)
Gave pt warm blanket and tucked in bed.

## 2018-09-10 NOTE — ED Notes (Signed)
Patient was given orange juice at this time.

## 2018-09-10 NOTE — Consult Note (Signed)
[place] [facility] Consult   Reason for Consult: Altered mental status Referring Physician:  Dr. Kerman Passey Patient Identification: Pedro Mccoy MRN:  [id number] Principal Diagnosis: Altered mental state Diagnosis:  Principal Problem:   Altered mental state   Total Time spent with patient: 1 hour  Subjective:   Pedro Mccoy is a 68 y.o. male patient presented to [facility] ED via EMS the patient was seen face-to-face by this provider; chart reviewed and consulted with Dr. Owens Shark on 09/10/2018 due to the care of the patient. It was discussed with ED provider that the patient dose meet criteria to be admitted to [facility] inpatient unit once a bed becomes available. The patient is experiencing altered mental status changes which makes him unsafe to live independently at this moment.  During his assessment when questions are posed to the patient he is unable to answer appropriately.  The patient is consistently derailing from questions which has been asked.  The patient states he was at a party and they were all put in a "padi-wagon" and brought "here".  This provider is unable to follow his reality which is a concern allowing him to be discharge. During the patient evaluation, he is alert and oriented x 1-2, calm and cooperative, and mood-congruent with affect. The patient does not appear to be responding to internal or external stimuli. At times his answers seem delusional. The patient denies any suicidal, homicidal, or self-harm ideations. The patient is not presenting with any psychotic or paranoid behaviors. The patient is unable to answer questions appropriately. When answering questions he is derailing from what is being asked.  Collateral was not obtained ED RN called [phone] and a message was left. TTS can follow-up in the morning to confirm if patient lives with his dad and step-mom.  Per Ms. Jennet Maduro RN-ED; Called pt number we have on file to see if a family member could come get him. No  one answered. This RN left a message. Pt arrived from home via EMS with complaints of altered mental status. Pt neighbor called EMS stating that he was out walking around as if he was wondering and stated that his speech was slurred.  Pt denies any pain. EMS stated that their stroke screen was negative. . The plan the patient is a safety risk, needs inpatient stabilization and treatment.  HPI:  Per Dr. Kerman Passey; Pedro Mccoy is a 68 y.o. male with a past medical history of hypertension, hyperlipidemia, diabetes, bipolar/schizoaffective, presents to the emergency department for possible altered mental status.  According EMS they were called by the patient's neighbor as the patient was wandering around his front yard.  They believe the patient to be acting abnormal however EMS states there is also another neighbor when they arrived he said that he appeared to be acting at his baseline per them.  Per care everywhere record review patient has bipolar and schizophrenia.  Here the patient overall appears well, slightly slurred speech, but is answering questions appropriately and following commands.  No acute distress.  Has no complaints at this time.  Patient states he lives with his parents.  Past Psychiatric History: Bipolar Schizoaffective  Risk to Self:  No Risk to Others:  No Prior Inpatient Therapy:  Unknown Prior Outpatient Therapy:   Unknown  Past Medical History:  Past Medical History:  Diagnosis Date  . Hypertension   . Hypertriglyceridemia .    Past Surgical History:  Procedure Laterality Date  . NO PAST SURGERIES     Family History: History reviewed.  No pertinent family history. Family Psychiatric  History:  Patient unable to provide information Social History:  Social History   Substance and Sexual Activity  Alcohol Use No     Social History   Substance and Sexual Activity  Drug Use No    Social History   Socioeconomic History  . Marital status: Widowed    Spouse  name: Not on file  . Number of children: Not on file  . Years of education: Not on file  . Highest education level: Not on file  Occupational History  . Not on file  Social Needs  . Financial resource strain: Not on file  . Food insecurity:    Worry: Not on file    Inability: Not on file  . Transportation needs:    Medical: Not on file    Non-medical: Not on file  Tobacco Use  . Smoking status: Never Smoker  . Smokeless tobacco: Never Used  Substance and Sexual Activity  . Alcohol use: No  . Drug use: No  . Sexual activity: Not on file  Lifestyle  . Physical activity:    Days per week: Not on file    Minutes per session: Not on file  . Stress: Not on file  Relationships  . Social connections:    Talks on phone: Not on file    Gets together: Not on file    Attends religious service: Not on file    Active member of club or organization: Not on file    Attends meetings of clubs or organizations: Not on file    Relationship status: Not on file  Other Topics Concern  . Not on file  Social History Narrative  . Not on file   Additional Social History:    Allergies:   Allergies  Allergen Reactions  . Pioglitazone Nausea Only  . Ace Inhibitors   . Codeine   . Sitagliptin Nausea And Vomiting    Labs:  Results for orders placed or performed during the hospital encounter of 09/09/18 (from the past 48 hour(s))  Urine Drug Screen, Qualitative ([facility] only)     Status: None   Collection Time: 09/09/18  7:47 PM  Result Value Ref Range   Tricyclic, Ur Screen NONE DETECTED NONE DETECTED   Amphetamines, Ur Screen NONE DETECTED NONE DETECTED   MDMA (Ecstasy)Ur Screen NONE DETECTED NONE DETECTED   Cocaine Metabolite,Ur [place] NONE DETECTED NONE DETECTED   Opiate, Ur Screen NONE DETECTED NONE DETECTED   Phencyclidine (PCP) Ur S NONE DETECTED NONE DETECTED   Cannabinoid 50 Ng, Ur [place] NONE DETECTED NONE DETECTED   Barbiturates, Ur Screen NONE DETECTED NONE DETECTED   Benzodiazepine,  Ur Scrn NONE DETECTED NONE DETECTED   Methadone Scn, Ur NONE DETECTED NONE DETECTED    Comment: (NOTE) Tricyclics + metabolites, urine    Cutoff 1000 ng/mL Amphetamines + metabolites, urine  Cutoff 1000 ng/mL MDMA (Ecstasy), urine              Cutoff 500 ng/mL Cocaine Metabolite, urine          Cutoff 300 ng/mL Opiate + metabolites, urine        Cutoff 300 ng/mL Phencyclidine (PCP), urine         Cutoff 25 ng/mL Cannabinoid, urine                 Cutoff 50 ng/mL Barbiturates + metabolites, urine  Cutoff 200 ng/mL Benzodiazepine, urine  Cutoff 200 ng/mL Methadone, urine                   Cutoff 300 ng/mL The urine drug screen provides only a preliminary, unconfirmed analytical test result and should not be used for non-medical purposes. Clinical consideration and professional judgment should be applied to any positive drug screen result due to possible interfering substances. A more specific alternate chemical method must be used in order to obtain a confirmed analytical result. Gas chromatography / mass spectrometry (GC/MS) is the preferred confirmat ory method. Performed at [facility], [address] [place], [place] [address]   Urinalysis, Complete w Microscopic     Status: Abnormal   Collection Time: 09/09/18  7:47 PM  Result Value Ref Range   Color, Urine STRAW (A) YELLOW   APPearance CLEAR (A) CLEAR   Specific Gravity, Urine 1.004 (L) 1.005 - 1.030   pH 6.0 5.0 - 8.0   Glucose, UA NEGATIVE NEGATIVE mg/dL   Hgb urine dipstick NEGATIVE NEGATIVE   Bilirubin Urine NEGATIVE NEGATIVE   Ketones, ur NEGATIVE NEGATIVE mg/dL   Protein, ur NEGATIVE NEGATIVE mg/dL   Nitrite NEGATIVE NEGATIVE   Leukocytes,Ua NEGATIVE NEGATIVE   RBC / HPF 0-5 0 - 5 RBC/hpf   WBC, UA NONE SEEN 0 - 5 WBC/hpf   Bacteria, UA NONE SEEN NONE SEEN   Squamous Epithelial / LPF NONE SEEN 0 - 5    Comment: Performed at [facility], [address] [place], [place] [address]   CBC     Status: None   Collection Time: 09/09/18  7:51 PM  Result Value Ref Range   WBC 7.8 4.0 - 10.5 K/uL   RBC 5.50 4.22 - 5.81 MIL/uL   Hemoglobin 15.9 13.0 - 17.0 g/dL   HCT 46.8 39.0 - 52.0 %   MCV 85.1 80.0 - 100.0 fL   MCH 28.9 26.0 - 34.0 pg   MCHC 34.0 30.0 - 36.0 g/dL   RDW 12.4 11.5 - 15.5 %   Platelets 214 150 - 400 K/uL   nRBC 0.0 0.0 - 0.2 %    Comment: Performed at [facility], [address] [place], [place] [address]  Comprehensive metabolic panel     Status: Abnormal   Collection Time: 09/09/18  7:51 PM  Result Value Ref Range   Sodium 140 135 - 145 mmol/L   Potassium 3.6 3.5 - 5.1 mmol/L   Chloride 106 98 - 111 mmol/L   CO2 24 22 - 32 mmol/L   Glucose, Bld 106 (H) 70 - 99 mg/dL   BUN 8 8 - 23 mg/dL   Creatinine, Ser 0.90 0.61 - 1.24 mg/dL   Calcium 9.0 8.9 - 10.3 mg/dL   Total Protein 6.6 6.5 - 8.1 g/dL   Albumin 4.5 3.5 - 5.0 g/dL   AST 21 15 - 41 U/L   ALT 20 0 - 44 U/L   Alkaline Phosphatase 84 38 - 126 U/L   Total Bilirubin 0.7 0.3 - 1.2 mg/dL   GFR calc non Af Amer >60 >60 mL/min   GFR calc Af Amer >60 >60 mL/min   Anion gap 10 5 - 15    Comment: Performed at [facility], [address] [place],  [address]  Troponin I - ONCE - STAT     Status: None   Collection Time: 09/09/18  7:51 PM  Result Value Ref Range   Troponin I <0.03 <0.03 ng/mL    Comment: Performed  at [facility], [address] [place], [place] [address]  Ethanol     Status: None   Collection Time: 09/09/18  7:51 PM  Result Value Ref Range   Alcohol, Ethyl (B) <10 <10 mg/dL    Comment: (NOTE) Lowest detectable limit for serum alcohol is 10 mg/dL. For medical purposes only. Performed at [facility], [address] [place], [place] [address]     Current Facility-Administered Medications  Medication Dose Route Frequency Provider Last Rate Last Dose  . ALPRAZolam Duanne Moron) tablet 2 mg  2 mg Oral QHS PRN Merlyn Lot, MD       . FLUoxetine (PROZAC) capsule 20 mg  20 mg Oral Daily Merlyn Lot, MD      . irbesartan (AVAPRO) tablet 75 mg  75 mg Oral Daily Merlyn Lot, MD      . NIFEdipine (PROCARDIA-XL/NIFEDICAL-XL) 24 hr tablet 30 mg  30 mg Oral Daily Merlyn Lot, MD      . omega-3 acid ethyl esters (LOVAZA) capsule 1 g  1 g Oral Daily Merlyn Lot, MD      . rosuvastatin (CRESTOR) tablet 20 mg  20 mg Oral Daily Merlyn Lot, MD       Current Outpatient Medications  Medication Sig Dispense Refill  . alprazolam (XANAX) 2 MG tablet Take 2 mg by mouth at bedtime as needed for sleep.    Marland Kitchen FLUoxetine (PROZAC) 20 MG capsule Take 20 mg by mouth daily.    Marland Kitchen NIFEdipine (PROCARDIA-XL/ADALAT CC) 30 MG 24 hr tablet Take 30 mg by mouth daily.    Marland Kitchen omega-3 acid ethyl esters (LOVAZA) 1 g capsule Take 1 g by mouth daily.    . rosuvastatin (CRESTOR) 20 MG tablet Take 20 mg by mouth daily.    Marland Kitchen telmisartan (MICARDIS) 20 MG tablet Take 20 mg by mouth daily.      Musculoskeletal: Strength & Muscle Tone: decreased Gait & Station: unsteady Patient leans: N/A  Psychiatric Specialty Exam: Physical Exam  Nursing note and vitals reviewed. Constitutional: He appears well-developed and well-nourished.  HENT:  Head: Normocephalic and atraumatic.  Eyes: Pupils are equal, round, and reactive to light. Conjunctivae and EOM are normal.  Neck: Normal range of motion. Neck supple.  Cardiovascular: Normal rate and regular rhythm.  Respiratory: Effort normal.  Musculoskeletal: Normal range of motion.  Neurological: He is alert.  Skin: Skin is warm and dry.    Review of Systems  Constitutional: Negative.   HENT: Negative.   Eyes: Negative.   Respiratory: Negative.   Cardiovascular: Negative.   Gastrointestinal: Negative.   Genitourinary: Negative.   Musculoskeletal: Negative.   Skin: Negative.   Neurological: Positive for tremors.  Endo/Heme/Allergies: Negative.   Psychiatric/Behavioral: Negative  for depression, hallucinations, memory loss, substance abuse and suicidal ideas. The patient is nervous/anxious. The patient does not have insomnia.     Blood pressure (!) 171/103, pulse 72, temperature 97.8 F (36.6 C), temperature source Oral, resp. rate 18, height 6\' 2"  (1.88 m), weight 97.5 kg, SpO2 100 %.Body mass index is 27.6 kg/m.  General Appearance: Disheveled  Eye Contact:  Good  Speech:  Garbled  Volume:  Decreased  Mood:  Calm  Affect:  Appropriate and Blunt  Thought Process:  Coherent and Linear  Orientation:  Other:  Alert and oriented to self  Thought Content:  Illogical and Tangential  Suicidal Thoughts:  No  Homicidal Thoughts:  No  Memory:  Recent;   Poor  Judgement:  Poor  Insight:  Lacking  Psychomotor Activity:  Decreased  Concentration:  Concentration: Poor  Recall:  Poor  Fund of Knowledge:  Poor  Language:  Fair  Akathisia:  NA  Handed:  Right  AIMS (if indicated):     Assets:  Communication Skills Housing Social Support  ADL's:  Intact  Cognition:  Impaired,  Moderate  Sleep:   unable to assess     Treatment Plan Summary: Daily contact with patient to assess and evaluate symptoms and progress in treatment, Medication management and Plan Patient needs to be admitted to a Geropsych inpatient unit   Disposition: Supportive therapy provided about ongoing stressors. Discussed crisis plan, support from social network, calling 911, coming to the Emergency Department, and calling Suicide Hotline.  Patient does meet criteria for psychiatric inpatient admission  Lamont Dowdy, NP 09/10/2018 2:59 AM

## 2018-09-10 NOTE — BH Assessment (Signed)
Assessment Note  Pedro Mccoy is an 67 y.o. male who presents to the ED with c/o an altered mental status. Per triage ED RN:  Pt arrived from home via EMS with complaints of altered mental status. Pt neighbor called EMS stating that he was out walking around as if he was wondering and stated that his speech was slurred.  Pt denies any pain. EMS stated that their stroke screen was negative. VS per EMS  CBG-116 BP-144/90 HR-74 R-11. EMS placed 20 in left forearm.          Diagnosis: Altered Mental Statru  Past Medical History:  Past Medical History:  Diagnosis Date  . Hypertension   . Hypertriglyceridemia .    Past Surgical History:  Procedure Laterality Date  . NO PAST SURGERIES      Family History: History reviewed. No pertinent family history.  Social History:  reports that he has never smoked. He has never used smokeless tobacco. He reports that he does not drink alcohol or use drugs.  Additional Social History:     CIWA: CIWA-Ar BP: (!) 171/103 Pulse Rate: 72 COWS:    Allergies:  Allergies  Allergen Reactions  . Pioglitazone Nausea Only  . Ace Inhibitors   . Codeine   . Sitagliptin Nausea And Vomiting    Home Medications: (Not in a hospital admission)   OB/GYN Status:  No LMP for male patient.  General Assessment Data Assessment unable to be completed: Yes Reason for not completing assessment: Pt has an altered mental status and is not appropriate for assessment.                                                  Advance Directives (For Healthcare) Does Patient Have a Medical Advance Directive?: No          Disposition:     On Site Evaluation by:   Reviewed with Physician:    Kennede Lusk D Goebel Hellums 09/10/2018 4:52 AM

## 2018-11-14 ENCOUNTER — Other Ambulatory Visit: Payer: Self-pay

## 2018-11-14 ENCOUNTER — Emergency Department: Payer: Medicare Other

## 2018-11-14 ENCOUNTER — Emergency Department
Admission: EM | Admit: 2018-11-14 | Discharge: 2018-11-14 | Discharge status: Against Medical Advice | Payer: Medicare Other | Attending: Internal Medicine | Admitting: Internal Medicine

## 2018-11-14 DIAGNOSIS — J181 Lobar pneumonia, unspecified organism: Secondary | ICD-10-CM | POA: Insufficient documentation

## 2018-11-14 DIAGNOSIS — R296 Repeated falls: Secondary | ICD-10-CM | POA: Diagnosis not present

## 2018-11-14 DIAGNOSIS — Z79899 Other long term (current) drug therapy: Secondary | ICD-10-CM | POA: Insufficient documentation

## 2018-11-14 DIAGNOSIS — Z1159 Encounter for screening for other viral diseases: Secondary | ICD-10-CM | POA: Insufficient documentation

## 2018-11-14 DIAGNOSIS — R531 Weakness: Secondary | ICD-10-CM | POA: Diagnosis not present

## 2018-11-14 DIAGNOSIS — Z5320 Procedure and treatment not carried out because of patient's decision for unspecified reasons: Secondary | ICD-10-CM | POA: Diagnosis not present

## 2018-11-14 DIAGNOSIS — R4182 Altered mental status, unspecified: Secondary | ICD-10-CM | POA: Diagnosis present

## 2018-11-14 DIAGNOSIS — I10 Essential (primary) hypertension: Secondary | ICD-10-CM | POA: Diagnosis not present

## 2018-11-14 DIAGNOSIS — J189 Pneumonia, unspecified organism: Secondary | ICD-10-CM

## 2018-11-14 LAB — COMPREHENSIVE METABOLIC PANEL
ALT: 21 U/L (ref 0–44)
AST: 20 U/L (ref 15–41)
Albumin: 4.2 g/dL (ref 3.5–5.0)
Alkaline Phosphatase: 111 U/L (ref 38–126)
Anion gap: 12 (ref 5–15)
BUN: 10 mg/dL (ref 8–23)
CO2: 20 mmol/L — ABNORMAL LOW (ref 22–32)
Calcium: 9 mg/dL (ref 8.9–10.3)
Chloride: 101 mmol/L (ref 98–111)
Creatinine, Ser: 0.92 mg/dL (ref 0.61–1.24)
GFR calc Af Amer: >60 mL/min (ref >60)
GFR calc non Af Amer: >60 mL/min (ref >60)
Glucose, Bld: 177 mg/dL — ABNORMAL HIGH (ref 70–99)
Potassium: 4.5 mmol/L (ref 3.5–5.1)
Sodium: 133 mmol/L — ABNORMAL LOW (ref 135–145)
Total Bilirubin: 0.8 mg/dL (ref 0.3–1.2)
Total Protein: 6.7 g/dL (ref 6.5–8.1)

## 2018-11-14 LAB — DIFFERENTIAL
Abs Immature Granulocytes: 0.1 10*3/uL — ABNORMAL HIGH (ref 0.00–0.07)
Basophils Absolute: 0.1 10*3/uL (ref 0.0–0.1)
Basophils Relative: 1 %
Eosinophils Absolute: 0.2 10*3/uL (ref 0.0–0.5)
Eosinophils Relative: 1 %
Immature Granulocytes: 1 %
Lymphocytes Relative: 6 %
Lymphs Abs: 1.1 10*3/uL (ref 0.7–4.0)
Monocytes Absolute: 1.1 10*3/uL — ABNORMAL HIGH (ref 0.1–1.0)
Monocytes Relative: 6 %
Neutro Abs: 16.5 10*3/uL — ABNORMAL HIGH (ref 1.7–7.7)
Neutrophils Relative %: 85 %

## 2018-11-14 LAB — APTT: aPTT: 28 seconds (ref 24–36)

## 2018-11-14 LAB — URINALYSIS, ROUTINE W REFLEX MICROSCOPIC
Bilirubin Urine: NEGATIVE
Glucose, UA: NEGATIVE mg/dL
Hgb urine dipstick: NEGATIVE
Ketones, ur: NEGATIVE mg/dL
Leukocytes,Ua: NEGATIVE
Nitrite: NEGATIVE
Protein, ur: NEGATIVE mg/dL
Specific Gravity, Urine: 1.01 (ref 1.005–1.030)
pH: 7 (ref 5.0–8.0)

## 2018-11-14 LAB — URINE DRUG SCREEN, QUALITATIVE (ARMC ONLY)
Amphetamines, Ur Screen: NOT DETECTED
Barbiturates, Ur Screen: NOT DETECTED
Benzodiazepine, Ur Scrn: POSITIVE — AB
Cannabinoid 50 Ng, Ur ~~LOC~~: NOT DETECTED
Cocaine Metabolite,Ur ~~LOC~~: NOT DETECTED
MDMA (Ecstasy)Ur Screen: NOT DETECTED
Methadone Scn, Ur: NOT DETECTED
Opiate, Ur Screen: NOT DETECTED
Phencyclidine (PCP) Ur S: NOT DETECTED
Tricyclic, Ur Screen: NOT DETECTED

## 2018-11-14 LAB — CBC
HCT: 49.8 % (ref 39.0–52.0)
Hemoglobin: 16.8 g/dL (ref 13.0–17.0)
MCH: 28.1 pg (ref 26.0–34.0)
MCHC: 33.7 g/dL (ref 30.0–36.0)
MCV: 83.4 fL (ref 80.0–100.0)
Platelets: 181 10*3/uL (ref 150–400)
RBC: 5.97 MIL/uL — ABNORMAL HIGH (ref 4.22–5.81)
RDW: 12.3 % (ref 11.5–15.5)
WBC: 19.2 10*3/uL — ABNORMAL HIGH (ref 4.0–10.5)
nRBC: 0 % (ref 0.0–0.2)

## 2018-11-14 LAB — SARS CORONAVIRUS 2 BY RT PCR (HOSPITAL ORDER, PERFORMED IN ~~LOC~~ HOSPITAL LAB): SARS Coronavirus 2: NEGATIVE

## 2018-11-14 LAB — PROTIME-INR
INR: 0.9 (ref 0.8–1.2)
Prothrombin Time: 12.3 seconds (ref 11.4–15.2)

## 2018-11-14 LAB — ETHANOL: Alcohol, Ethyl (B): <10 mg/dL (ref <10)

## 2018-11-14 MED ORDER — AZITHROMYCIN 250 MG PO TABS: ORAL_TABLET | ORAL | 0 refills | Status: DC

## 2018-11-14 MED ORDER — SODIUM CHLORIDE 0.9 % IV SOLN
500.0000 mg | INTRAVENOUS | Status: DC
  Administered 2018-11-14: 500 mg via INTRAVENOUS
  Filled 2018-11-14: qty 500

## 2018-11-14 MED ORDER — SODIUM CHLORIDE 0.9 % IV SOLN
2.0000 g | INTRAVENOUS | Status: DC
  Administered 2018-11-14: 2 g via INTRAVENOUS
  Filled 2018-11-14: qty 20

## 2018-11-14 MED ORDER — AMOXICILLIN-POT CLAVULANATE 875-125 MG PO TABS: 1.0000 | ORAL_TABLET | Freq: Two times a day (BID) | ORAL | 0 refills | Status: AC

## 2018-11-14 NOTE — ED Triage Notes (Signed)
Pt comes from home via EMS after multiple falls in the past few hours outside his home. Pt also has had some AMS with EMS and some different speech. Pt CBG was 167. Pt was negative for stroke screen. Pt able to tell us name and DOB. Oriented to place but not time. Pt has abrasions to knees but no c/o pain.

## 2018-11-14 NOTE — ED Provider Notes (Signed)
[facility] Emergency Department Provider Note    First MD Initiated Contact with Patient 11/14/18 1149     (approximate)  I have reviewed the triage vital signs and the nursing notes.   HISTORY  Chief Complaint Fall and Altered Mental Status    HPI Pedro Mccoy is a 68 y.o. male with a history of confusion and schizophrenia presents the ER after being found wandering around in front yard with multiple falls this morning.  Denies any pain.  States that he just tripped over himself.  Denies any nausea or vomiting.  No numbness or tingling.  States he did fall and hit his head once.  Has had some recent medication changes.  Denies any hallucinations at this time.  No intent for self-harm.    Past Medical History:  Diagnosis Date  . Hypertension   . Hypertriglyceridemia .   History reviewed. No pertinent family history. Past Surgical History:  Procedure Laterality Date  . NO PAST SURGERIES     Patient Active Problem List   Diagnosis Date Noted  . Altered mental state 09/10/2018  . Schizophrenia ([place])       Prior to Admission medications   Medication Sig Start Date End Date Taking? Authorizing Provider  alprazolam Duanne Moron) 2 MG tablet Take 2 mg by mouth at bedtime as needed for sleep.    [provider]  FLUoxetine (PROZAC) 20 MG capsule Take 20 mg by mouth daily. 08/17/18   [provider]  NIFEdipine (PROCARDIA-XL/ADALAT CC) 30 MG 24 hr tablet Take 30 mg by mouth daily.    [provider]  omega-3 acid ethyl esters (LOVAZA) 1 g capsule Take 1 g by mouth daily.    [provider]  rosuvastatin (CRESTOR) 20 MG tablet Take 20 mg by mouth daily.    [provider]  telmisartan (MICARDIS) 20 MG tablet Take 20 mg by mouth daily.    [provider]    Allergies Pioglitazone; Ace inhibitors; Codeine; and Sitagliptin    Social History Social History   Tobacco Use  . Smoking status: Never Smoker   . Smokeless tobacco: Never Used  Substance Use Topics  . Alcohol use: No  . Drug use: No    Review of Systems Patient denies headaches, rhinorrhea, blurry vision, numbness, shortness of breath, chest pain, edema, cough, abdominal pain, nausea, vomiting, diarrhea, dysuria, fevers, rashes or hallucinations unless otherwise stated above in HPI. ____________________________________________   PHYSICAL EXAM:  VITAL SIGNS: Vitals:   11/14/18 1330 11/14/18 1400  BP: 132/76 135/81  Pulse: 70 66  Resp: (!) 21 (!) 21  Temp:    SpO2: 94% 92%    Constitutional: Alert and oriented.  Pleasant and cooperative  Eyes: Conjunctivae are normal.  Head: Atraumatic. Nose: No congestion/rhinnorhea. Mouth/Throat: Mucous membranes are moist.   Neck: No stridor. Painless ROM.  Cardiovascular: Normal rate, regular rhythm. Grossly normal heart sounds.  Good peripheral circulation. Respiratory: Normal respiratory effort.  No retractions. Lungs CTAB. Gastrointestinal: Soft and nontender. No distention. No abdominal bruits. No CVA tenderness. Genitourinary:  Musculoskeletal: No lower extremity tenderness nor edema.  No joint effusions. Neurologic:  Normal speech and language. No gross focal neurologic deficits are appreciated. No facial droop Skin:  Skin is warm, dry and intact. No rash noted. Psychiatric: Mood and affect are normal.   ____________________________________________   LABS (all labs ordered are listed, but only abnormal results are displayed)  Results for orders placed or performed during the hospital encounter of 11/14/18 (  from the past 24 hour(s))  Ethanol     Status: None   Collection Time: 11/14/18 11:47 AM  Result Value Ref Range   Alcohol, Ethyl (B) <10 <10 mg/dL  Protime-INR     Status: None   Collection Time: 11/14/18 11:47 AM  Result Value Ref Range   Prothrombin Time 12.3 11.4 - 15.2 seconds   INR 0.9 0.8 - 1.2  APTT     Status: None   Collection Time: 11/14/18 11:47  AM  Result Value Ref Range   aPTT 28 24 - 36 seconds  CBC     Status: Abnormal   Collection Time: 11/14/18 11:47 AM  Result Value Ref Range   WBC 19.2 (H) 4.0 - 10.5 K/uL   RBC 5.97 (H) 4.22 - 5.81 MIL/uL   Hemoglobin 16.8 13.0 - 17.0 g/dL   HCT 49.8 39.0 - 52.0 %   MCV 83.4 80.0 - 100.0 fL   MCH 28.1 26.0 - 34.0 pg   MCHC 33.7 30.0 - 36.0 g/dL   RDW 12.3 11.5 - 15.5 %   Platelets 181 150 - 400 K/uL   nRBC 0.0 0.0 - 0.2 %  Differential     Status: Abnormal   Collection Time: 11/14/18 11:47 AM  Result Value Ref Range   Neutrophils Relative % 85 %   Neutro Abs 16.5 (H) 1.7 - 7.7 K/uL   Lymphocytes Relative 6 %   Lymphs Abs 1.1 0.7 - 4.0 K/uL   Monocytes Relative 6 %   Monocytes Absolute 1.1 (H) 0.1 - 1.0 K/uL   Eosinophils Relative 1 %   Eosinophils Absolute 0.2 0.0 - 0.5 K/uL   Basophils Relative 1 %   Basophils Absolute 0.1 0.0 - 0.1 K/uL   Immature Granulocytes 1 %   Abs Immature Granulocytes 0.10 (H) 0.00 - 0.07 K/uL  Comprehensive metabolic panel     Status: Abnormal   Collection Time: 11/14/18 11:47 AM  Result Value Ref Range   Sodium 133 (L) 135 - 145 mmol/L   Potassium 4.5 3.5 - 5.1 mmol/L   Chloride 101 98 - 111 mmol/L   CO2 20 (L) 22 - 32 mmol/L   Glucose, Bld 177 (H) 70 - 99 mg/dL   BUN 10 8 - 23 mg/dL   Creatinine, Ser 0.92 0.61 - 1.24 mg/dL   Calcium 9.0 8.9 - 10.3 mg/dL   Total Protein 6.7 6.5 - 8.1 g/dL   Albumin 4.2 3.5 - 5.0 g/dL   AST 20 15 - 41 U/L   ALT 21 0 - 44 U/L   Alkaline Phosphatase 111 38 - 126 U/L   Total Bilirubin 0.8 0.3 - 1.2 mg/dL   GFR calc non Af Amer >60 >60 mL/min   GFR calc Af Amer >60 >60 mL/min   Anion gap 12 5 - 15  Urine Drug Screen, Qualitative     Status: Abnormal   Collection Time: 11/14/18 12:32 PM  Result Value Ref Range   Tricyclic, Ur Screen NONE DETECTED NONE DETECTED   Amphetamines, Ur Screen NONE DETECTED NONE DETECTED   MDMA (Ecstasy)Ur Screen NONE DETECTED NONE DETECTED   Cocaine Metabolite,Ur [place] NONE  DETECTED NONE DETECTED   Opiate, Ur Screen NONE DETECTED NONE DETECTED   Phencyclidine (PCP) Ur S NONE DETECTED NONE DETECTED   Cannabinoid 50 Ng, Ur [place] NONE DETECTED NONE DETECTED   Barbiturates, Ur Screen NONE DETECTED NONE DETECTED   Benzodiazepine, Ur Scrn POSITIVE (A) NONE DETECTED   Methadone Scn, Ur NONE DETECTED NONE  DETECTED  Urinalysis, Routine w reflex microscopic     Status: Abnormal   Collection Time: 11/14/18 12:32 PM  Result Value Ref Range   Color, Urine YELLOW (A) YELLOW   APPearance CLEAR (A) CLEAR   Specific Gravity, Urine 1.010 1.005 - 1.030   pH 7.0 5.0 - 8.0   Glucose, UA NEGATIVE NEGATIVE mg/dL   Hgb urine dipstick NEGATIVE NEGATIVE   Bilirubin Urine NEGATIVE NEGATIVE   Ketones, ur NEGATIVE NEGATIVE mg/dL   Protein, ur NEGATIVE NEGATIVE mg/dL   Nitrite NEGATIVE NEGATIVE   Leukocytes,Ua NEGATIVE NEGATIVE  SARS Coronavirus 2 (CEPHEID- Performed in [facility] hospital lab), Hosp Order     Status: None   Collection Time: 11/14/18  1:37 PM  Result Value Ref Range   SARS Coronavirus 2 NEGATIVE NEGATIVE   ____________________________________________  EKG My review and personal interpretation at Time: 11:44   Indication: ams  Rate: 80  Rhythm: sinus Axis: normal Other: normal intervals, no stemi ____________________________________________  RADIOLOGY  I personally reviewed all radiographic images ordered to evaluate for the above acute complaints and reviewed radiology reports and findings.  These findings were personally discussed with the patient.  Please see medical record for radiology report.  ____________________________________________   PROCEDURES  Procedure(s) performed:  Procedures    Critical Care performed: no ____________________________________________   INITIAL IMPRESSION / ASSESSMENT AND PLAN / ED COURSE  Pertinent labs & imaging results that were available during my care of the patient were reviewed by me and considered in my  medical decision making (see chart for details).   DDX: Dehydration, sepsis, pna, uti, hypoglycemia, cva, drug effect, withdrawal,  Pedro Mccoy is a 68 y.o. who presents to the ED with symptoms as described above.  CT imaging will be ordered for evaluation of frequent falls.  Blood will be sent for the above differential.  Clinical Course as of Nov 13 1520  Sun Nov 14, 2018  1328 Leukocytosis was chest x-ray showing right-sided infiltrate concerning for pneumonia.  Given altered mental status change behavior certainly could be developing pneumonia.  Will order culture as well as antibiotics.   [PR]  1520 Patient unable to ambulate without two-person assist.  Does have significant weakness.  Patient will require admission the hospital for further medical management.   [PR]    Clinical Course User Index [PR] Merlyn Lot, MD   The patient was evaluated in Emergency Department today for the symptoms described in the history of present illness. He/she was evaluated in the context of the global COVID-19 pandemic, which necessitated consideration that the patient might be at risk for infection with the SARS-CoV-2 virus that causes COVID-19. Institutional protocols and algorithms that pertain to the evaluation of patients at risk for COVID-19 are in a state of rapid change based on information released by regulatory bodies including the CDC and federal and state organizations. These policies and algorithms were followed during the patient's care in the ED.   As part of my medical decision making, I reviewed the following data within the [place] notes reviewed and incorporated, Labs reviewed, notes from prior ED visits and [place] Controlled Substance Database   ____________________________________________   FINAL CLINICAL IMPRESSION(S) / ED DIAGNOSES  Final diagnoses:  Pneumonia of right lower lobe due to infectious organism ([place])  Weakness      NEW MEDICATIONS  STARTED DURING THIS VISIT:  New Prescriptions   No medications on file     Note:  This document was prepared using  Dragon Armed forces training and education officer and may include unintentional dictation errors.    Merlyn Lot, MD 11/14/18 [phone]

## 2018-11-14 NOTE — ED Notes (Signed)
Pt ambulated from bed to door to wall to door and back to bed with x1 assistance. Pt was mostly steady on his feet and reported no SOB or dizziness. Pt states that he just needed to get his muscles back in his legs. Pt sats stayed between 95%-97% while walking.

## 2018-11-14 NOTE — ED Notes (Signed)
Admitting MD at bedside.

## 2018-11-14 NOTE — ED Notes (Signed)
Dr. Alfred Levins also spoke to pt about staying in the hospital. Pt still refuses with her. Pt able to take taxi and willing to pay when he gets home. This RN will call taxi service and pt will wait for taxi in wheelchair. Pt AOx4, agreeable to wait for taxi, and has caused no trouble for this RN.

## 2018-11-14 NOTE — ED Notes (Addendum)
Pt refusing to stay in hospital over night and refuses to talk to admitting MD. Pt states that he "feels fine" and that he is just ready to go home. Pt states that he is going to call a cab and pay for it when he gets back to his house.

## 2018-11-14 NOTE — ED Provider Notes (Addendum)
Patient was seen by Dr. Quentin Cornwall for altered mental status.  Has a history of schizophrenia.  He was found to have leukocytosis and a chest x-ray concerning for pneumonia.  Patient is currently at baseline, alert and oriented x3.  He is refusing admission and prefers to go home.  He is on room air with normal work of breathing, normal sats, and normal vitals otherwise.  There are no signs of sepsis.  Discussed risks of going home [place] in the setting of an infection which include sepsis, respiratory failure, respiratory arrest, cardiac arrest.  Patient understands these risks but wishes to go home.  I will prescribe him Augmentin and azithromycin.  I recommended close follow-up with PCP.  I discussed return precautions for shortness of breath, chest pain, or fever.  Patient is able to walk with no assistance.  Will provide a walker for home.   Alfred Levins, [place], MD 11/14/18 [place], [place], [place] 11/14/18 [phone]

## 2018-11-14 NOTE — ED Notes (Signed)
Taxi service on way. Pt given a walker to get around at home and taught how to use it without incident or issue. Pt understands prescriptions for pneumonia. Pt verbalized many times that he knows he is signing out against a doctor's advice.

## 2018-11-14 NOTE — ED Notes (Addendum)
Patient transported to CT/xray 

## 2018-11-19 LAB — CULTURE, BLOOD (ROUTINE X 2)
Culture: NO GROWTH
Culture: NO GROWTH
Special Requests: ADEQUATE

## 2019-05-29 ENCOUNTER — Ambulatory Visit
Admission: EM | Admit: 2019-05-29 | Discharge: 2019-05-29 | Disposition: A | Discharge status: Home / Self Care | Payer: Medicare Other

## 2019-05-29 ENCOUNTER — Other Ambulatory Visit: Payer: Self-pay

## 2019-05-29 DIAGNOSIS — H6122 Impacted cerumen, left ear: Secondary | ICD-10-CM

## 2019-05-29 NOTE — ED Triage Notes (Signed)
Pt here for left clogged ear, states he normally has a problem with ear wax build up. Hasn't been able to ear out of his left ear since Thursday.

## 2019-05-29 NOTE — ED Provider Notes (Signed)
[facility] ____________________________________________  Time seen: Approximately 1:50 PM  I have reviewed the triage vital signs and the nursing notes.   HISTORY  Chief Complaint Ear Problem   HPI Pedro Mccoy is a 68 y.o. male presenting for evaluation of left ear clogged sensation.  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  States left ear feels muffled and clogged.  Denies pain.  Denies right ear complaints.  No recent cough, congestion, sore throat or ear.  Denies aggravating leaving factors.  States otherwise feels well.   Past Medical History:  Diagnosis Date  . Hypertension   . Hypertriglyceridemia .    Patient Active Problem List   Diagnosis Date Noted  . Altered mental state 09/10/2018  . Schizophrenia [facility])     Past Surgical History:  Procedure Laterality Date  . NO PAST SURGERIES       No current facility-administered medications for this encounter.   Current Outpatient Medications:  .  azithromycin (ZITHROMAX) 250 MG tablet, Take 1 a day for 4 days, Disp: 4 each, Rfl: 0 .  benztropine (COGENTIN) 0.5 MG tablet, Take 0.5 mg by mouth 2 (two) times a day., Disp: , Rfl:  .  Eszopiclone 3 MG TABS, Take 3 mg by mouth at bedtime., Disp: , Rfl:  .  FLUoxetine (PROZAC) 20 MG capsule, Take 20 mg by mouth daily., Disp: , Rfl:  .  fluvoxaMINE (LUVOX) 100 MG tablet, Take 100 mg by mouth 2 (two) times a day., Disp: , Rfl:  .  haloperidol (HALDOL) 5 MG tablet, Take 2.5 mg by mouth daily., Disp: , Rfl:  .  lamoTRIgine (LAMICTAL) 200 MG tablet, Take 200 mg by mouth 2 (two) times a day., Disp: , Rfl:  .  LORazepam (ATIVAN) 1 MG tablet, Take 1-1.5 mg by mouth daily as needed for anxiety., Disp: , Rfl:  .  metFORMIN (GLUCOPHAGE) 1000 MG tablet, Take 1,000 mg by mouth 2 (two) times a day., Disp: , Rfl:  .  metoprolol succinate (TOPROL-XL) 50 MG 24 hr tablet, Take 50 mg by mouth daily. Take with or immediately following a  meal., Disp: , Rfl:  .  NIFEdipine (PROCARDIA-XL/ADALAT CC) 30 MG 24 hr tablet, Take 30 mg by mouth daily., Disp: , Rfl:  .  omega-3 acid ethyl esters (LOVAZA) 1 g capsule, Take 1 g by mouth daily., Disp: , Rfl:  .  risperiDONE (RISPERDAL) 1 MG tablet, Take 1 mg by mouth 2 (two) times a day., Disp: , Rfl:  .  rosuvastatin (CRESTOR) 20 MG tablet, Take 20 mg by mouth daily., Disp: , Rfl:  .  telmisartan (MICARDIS) 20 MG tablet, Take 20 mg by mouth daily., Disp: , Rfl:   Allergies Pioglitazone, Ace inhibitors, Codeine, and Sitagliptin  Family History  Problem Relation Age of Onset  . COPD Mother   . Heart attack Father     Social History Social History   Tobacco Use  . Smoking status: Never Smoker  . Smokeless tobacco: Never Used  Substance Use Topics  . Alcohol use: No  . Drug use: No    Review of Systems Constitutional: No fever ENT: No sore throat. Cardiovascular: Denies chest pain. Respiratory: Denies shortness of breath. Musculoskeletal: Negative for back pain. Skin: Negative for rash.   ____________________________________________   PHYSICAL EXAM:  VITAL SIGNS: ED Triage Vitals [05/29/19 1343]  Enc Vitals Group     BP 106/80     Pulse Rate 94  Resp 18     Temp 97.9 F (36.6 C)     Temp Source Oral     SpO2 97 %     Weight 210 lb (95.3 kg)     Height      Head Circumference      Peak Flow      Pain Score 0     Pain Loc      Pain Edu?      Excl. in [place]?     Constitutional: Alert and oriented. Well appearing and in no acute distress. Eyes: Conjunctivae are normal.  ENT      Head: Normocephalic       Ears: Left: Nontender, total cerumen impaction, post cerumen removal, canal clear, minimal canal erythema, normal TM.  Right: Nontender, mild cerumen present, otherwise canal clear, no erythema, normal TM. Cardiovascular: Normal heart rate Respiratory: Normal respiratory effort without tachypnea nor retractions. Musculoskeletal: Steady gait.  Neurologic:  Normal speech and language. Speech is normal. No gait instability.  Skin:  Skin is warm, dry and intact. No rash noted. Psychiatric: Mood and affect are normal. Speech and behavior are normal. Patient exhibits appropriate insight and judgment   ___________________________________________   LABS (all labs ordered are listed, but only abnormal results are displayed)  Labs Reviewed - No data to display  PROCEDURES Procedures   Ear irrigation Procedure explained verbal consent obtained. Left ear total cerumen impaction, removed by nursing staff with irrigation.  Patient tolerated well.  Canal clear post.  INITIAL IMPRESSION / ASSESSMENT AND PLAN / ED COURSE  Pertinent labs & imaging results that were available during my care of the patient were reviewed by me and considered in my medical decision making (see chart for details).  Well-appearing patient.  Cerumen impaction removed.  Patient tolerated well.  Supportive care.  Discussed follow up and return parameters including no resolution or any worsening concerns. Patient verbalized understanding and agreed to plan.   ____________________________________________   FINAL CLINICAL IMPRESSION(S) / ED DIAGNOSES  Final diagnoses:  Impacted cerumen of left ear     ED Discharge Orders    None       Note: This dictation was prepared with Dragon dictation along with smaller phrase technology. Any transcriptional errors that result from this process are unintentional.         Marylene Land, NP 05/29/19 1421

## 2019-08-11 IMAGING — CT CT HEAD WITHOUT CONTRAST
4 series · 16 of 47 positions shown, 18 images · non-contrast
Comparison: None.

CLINICAL DATA: Pt arrived from home via EMS with complaints of
altered mental status. Pt neighbor called EMS stating that he was
out walking around as if he was wondering and stated that his speech
was slurred. Pt denies any pain. EMS stated that their stroke screen
was negative.

EXAM:
CT HEAD WITHOUT CONTRAST
TECHNIQUE: Contiguous axial images were obtained from the base of the skull
through the vertex without intravenous contrast.

[Series 2: head bone · axial · 0.44mm/px · z∈[+199,+253]mm · 4 of 81 slices shown]
[im 8/81  bone]
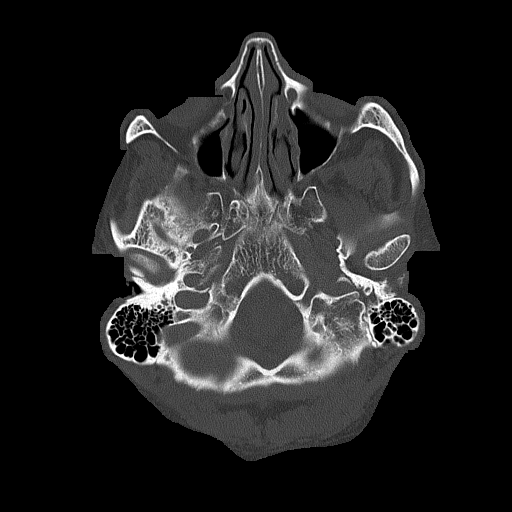
[im 16/81  bone]
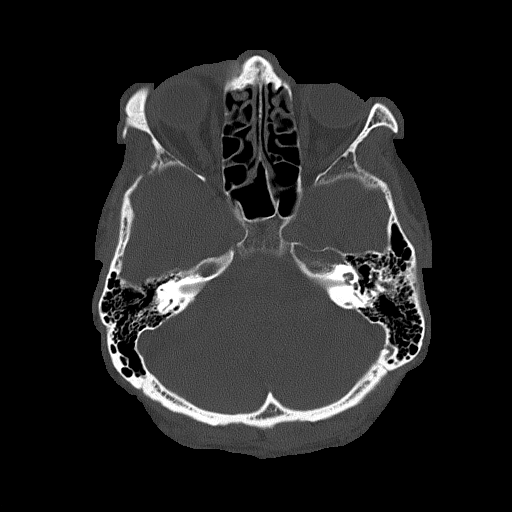
[im 27/81  bone]
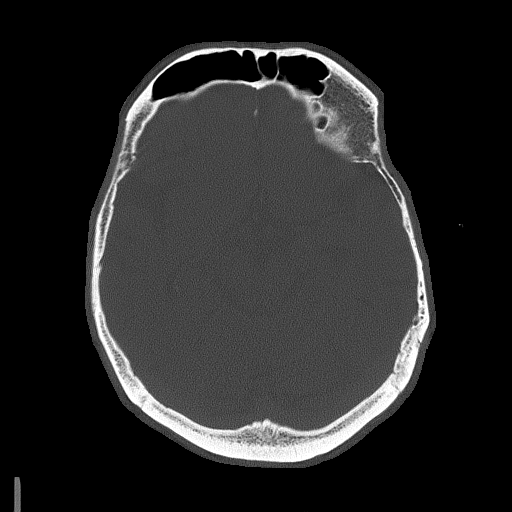
[im 35/81  bone]
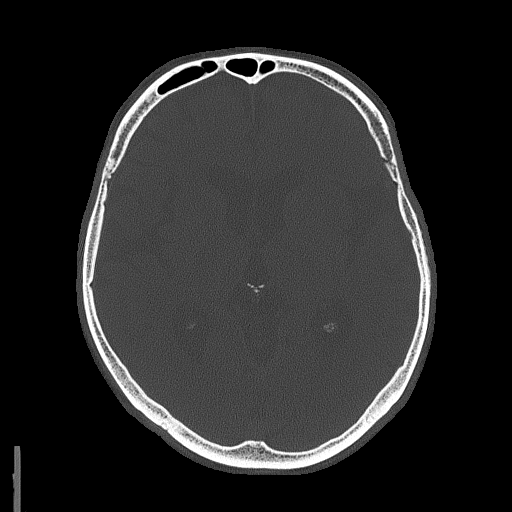

[Series 3: head wo · axial · 0.44mm/px · z∈[+205,+315]mm · 6 of 32 slices shown, 8 images]
[im 5/32  brain]
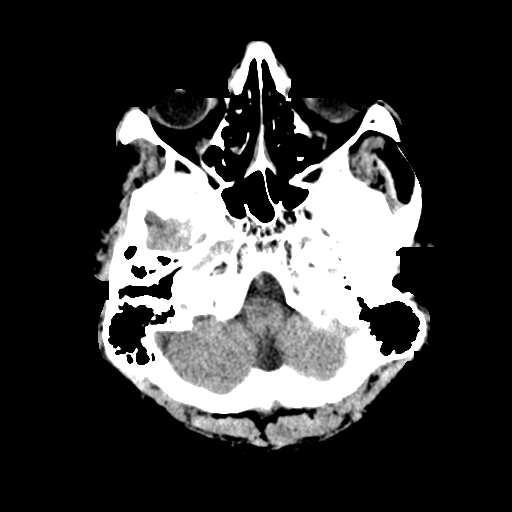
[im 5/32  bone]
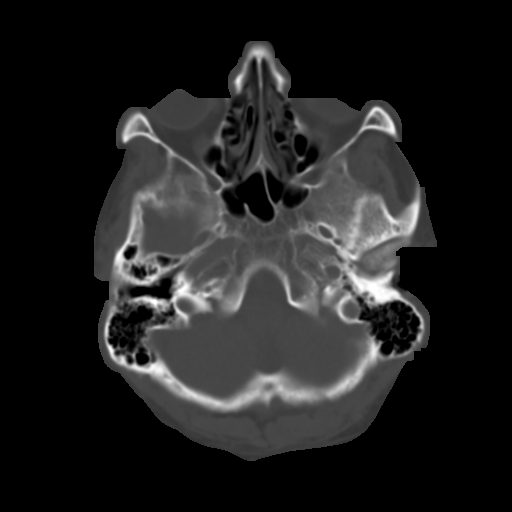
[im 9/32  brain]
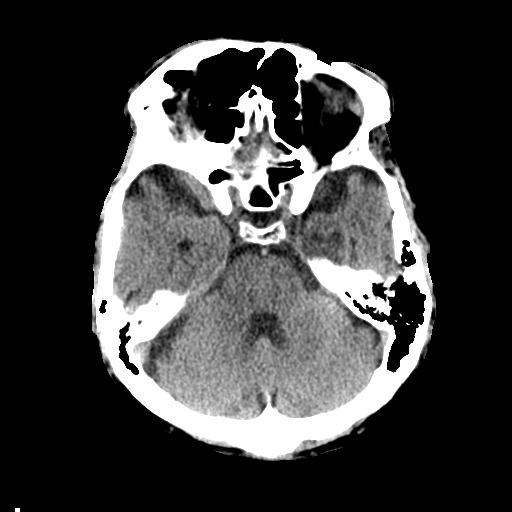
[im 14/32  brain]
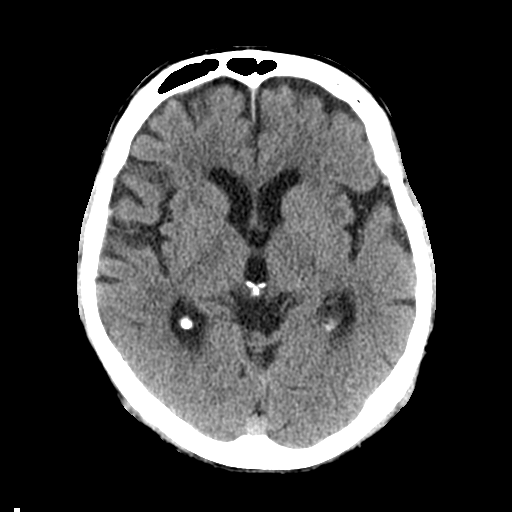
[im 18/32  brain]
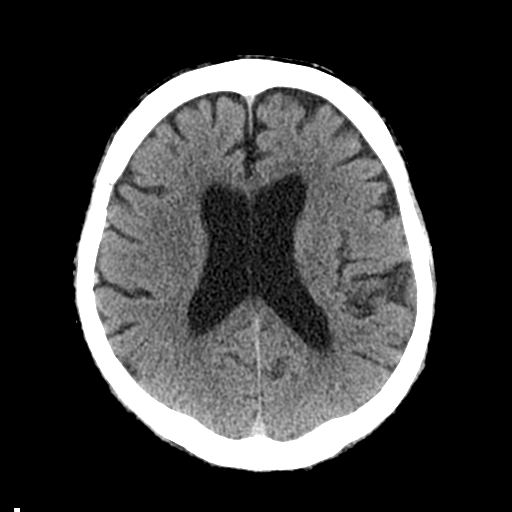
[im 23/32  brain]
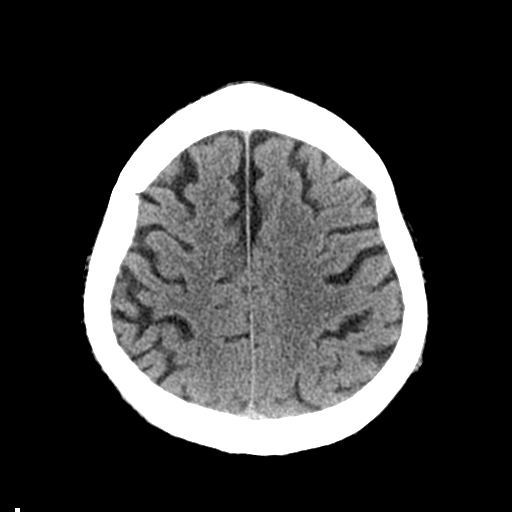
[im 23/32  bone]
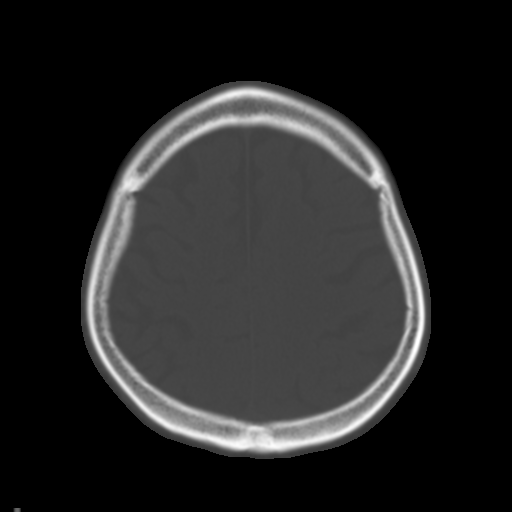
[im 27/32  brain]
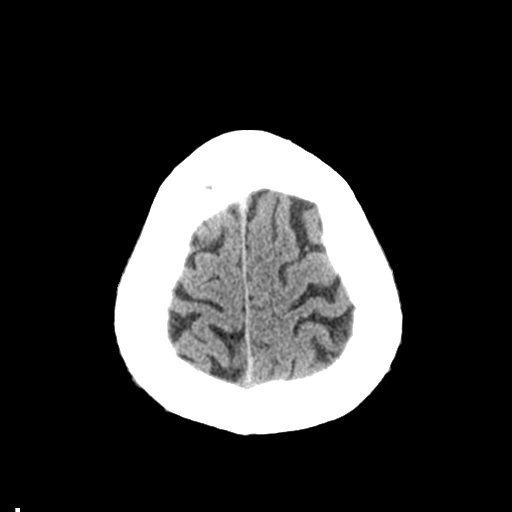

[Series 4: coronal soft tissue · coronal · 0.35mm/px · 3 of 70 slices shown]
[im 24/70  brain]
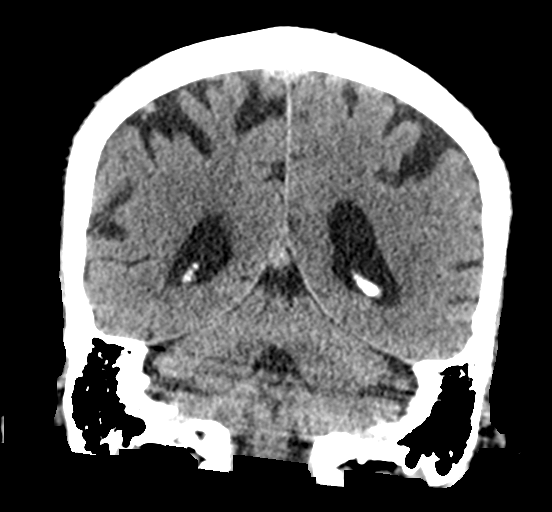
[im 31/70  brain]
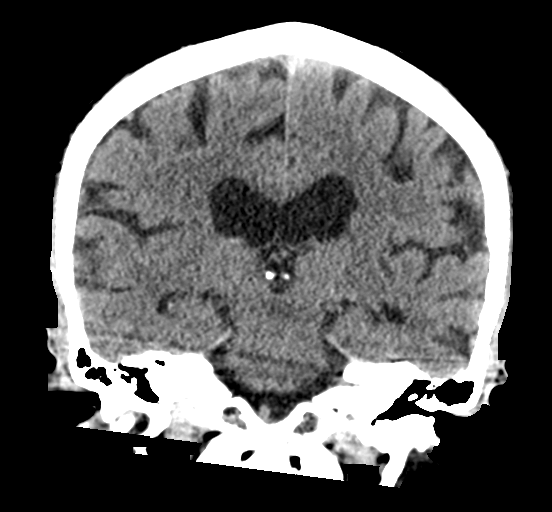
[im 39/70  brain]
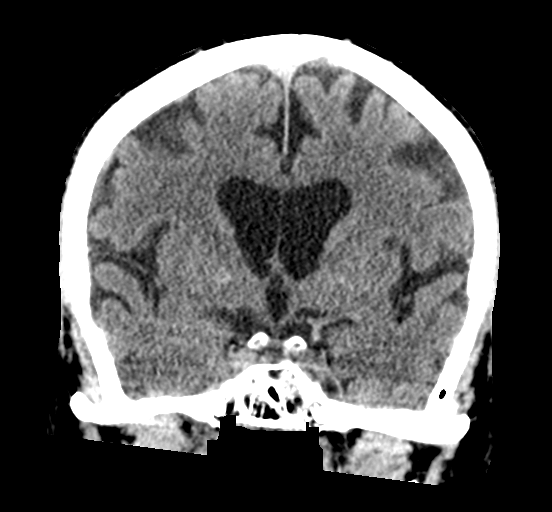

[Series 5: sagittal soft tissue · sagittal · 0.36mm/px · 3 of 58 slices shown]
[im 20/58  brain]
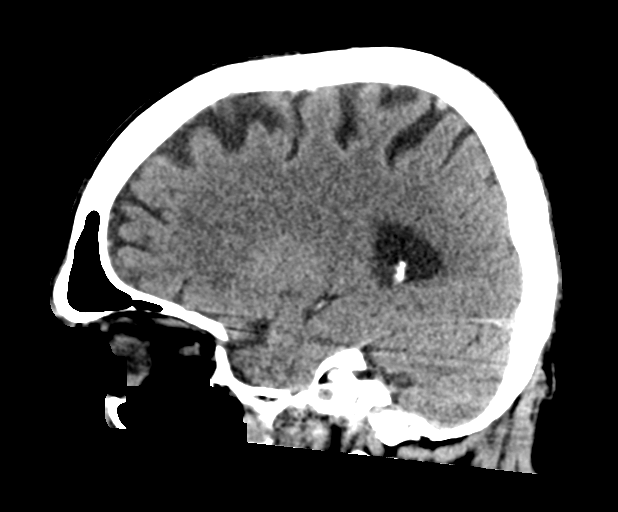
[im 29/58  brain]
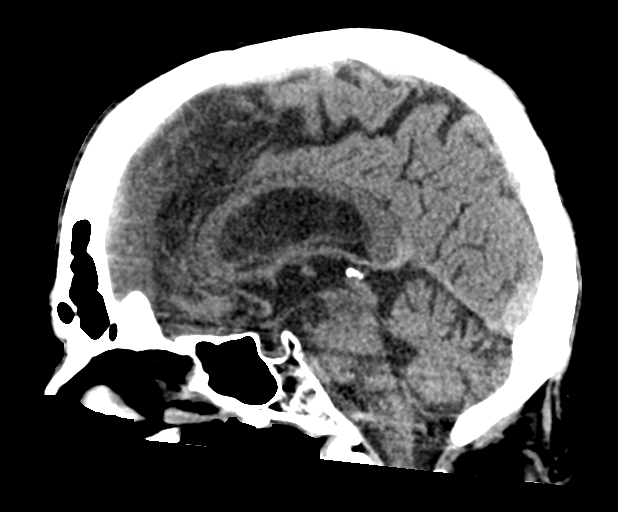
[im 39/58  brain]
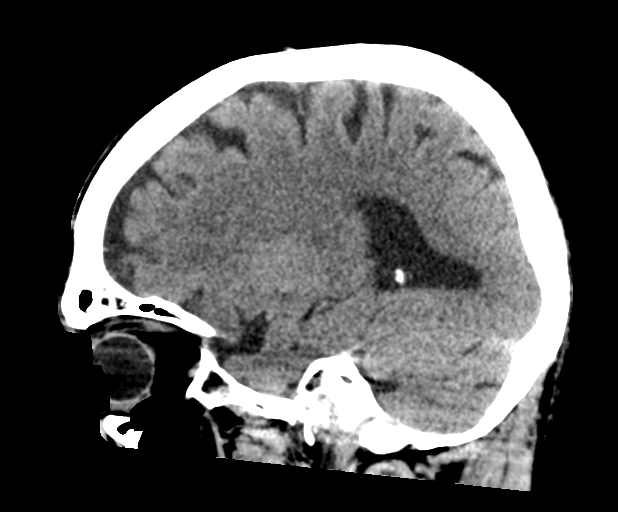

[16 of 47 positions shown; findings below may reference images not displayed]

FINDINGS: Brain: No evidence of acute infarction, hemorrhage, hydrocephalus,
extra-axial collection or mass lesion/mass effect.

There is ventricular and sulcal enlargement reflecting moderate
atrophy, advanced for age.

Vascular: No hyperdense vessel or unexpected calcification.

Skull: Normal. Negative for fracture or focal lesion.

Sinuses/Orbits: Globes and orbits are unremarkable. Mild ethmoid and
minimal maxillary sinus mucosal thickening. Clear mastoid air cells.

Other: None.
IMPRESSION: 1. No acute intracranial abnormalities.
2. Atrophy advanced for age.

## 2019-10-16 IMAGING — DX CHEST  1 VIEW
1 series · 2 of 2 positions shown · non-contrast
Comparison: None.

CLINICAL DATA: Multiple falls.  Evaluate for pneumonia.

EXAM:
CHEST  1 VIEW

[Series 1: chest ap · 0.14mm/px · 2 of 2 slices shown]
[im 1/2]
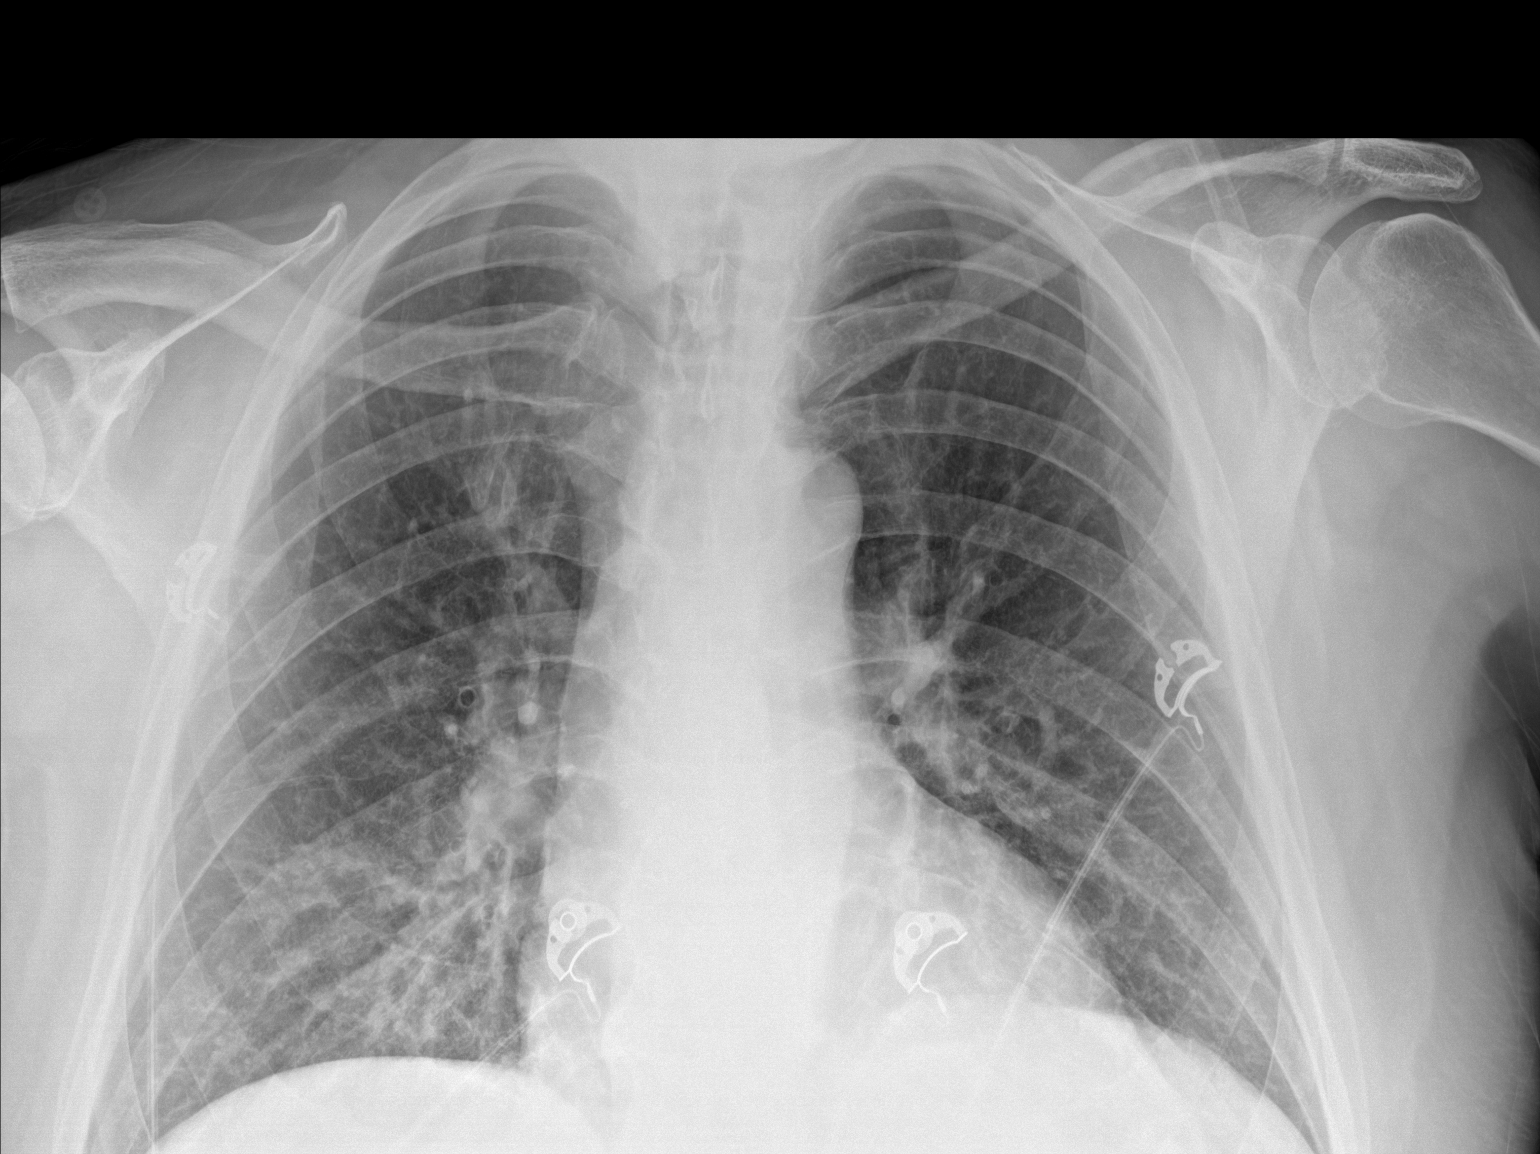
[im 2/2]
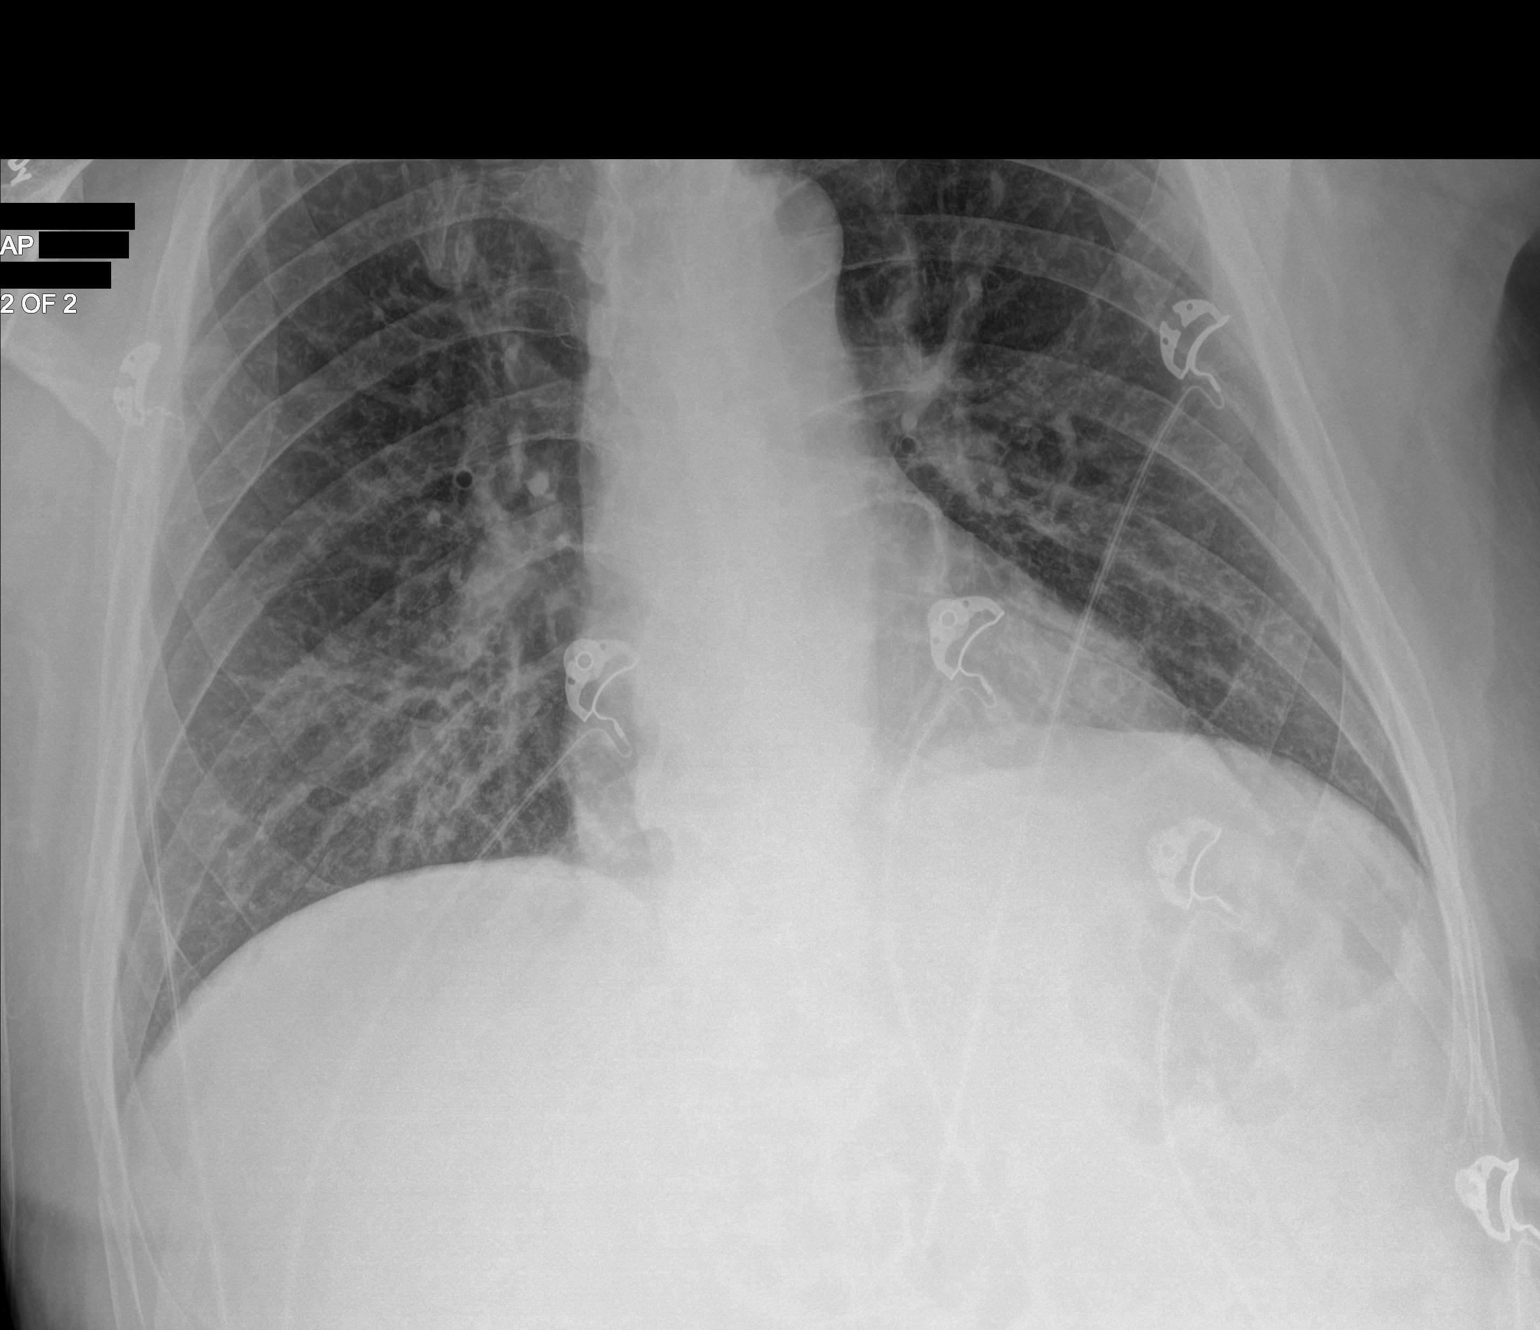

[2 of 2 positions shown; findings below may reference images not displayed]

FINDINGS: Mild opacity in the right base, asymmetric to the left. The heart,
hila, mediastinum, lungs, and pleura are otherwise unremarkable.
IMPRESSION: Mild opacity in the right base, asymmetric to the left, is
concerning for developing infiltrate. Recommend follow-up to
resolution.

## 2019-10-16 IMAGING — CT CT HEAD WITHOUT CONTRAST
5 of 7 series · 17 of 47 positions shown, 18 images · non-contrast
Comparison: CT head without contrast 09/09/2018.

CLINICAL DATA: Multiple falls within the last several hours.
Altered mental status. Hyperglycemia.

EXAM:
CT HEAD WITHOUT CONTRAST
CT CERVICAL SPINE WITHOUT CONTRAST
TECHNIQUE: Multidetector CT imaging of the head and cervical spine was
performed following the standard protocol without intravenous
contrast. Multiplanar CT image reconstructions of the cervical spine
were also generated.

[Series 2: head wo · axial · 0.47mm/px · z∈[-106,-56]mm · 2 of 32 slices shown, 3 images]
[im 11/32  brain]
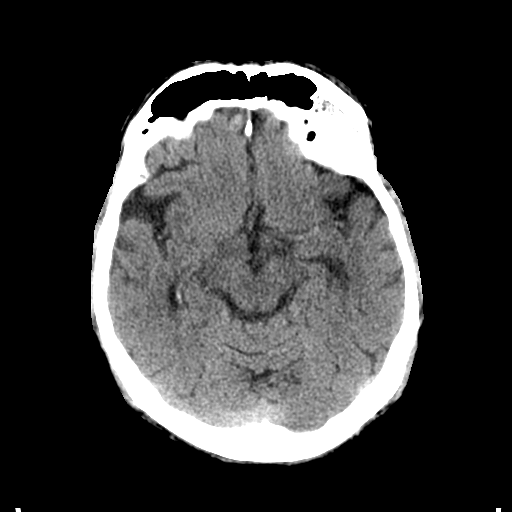
[im 11/32  bone]
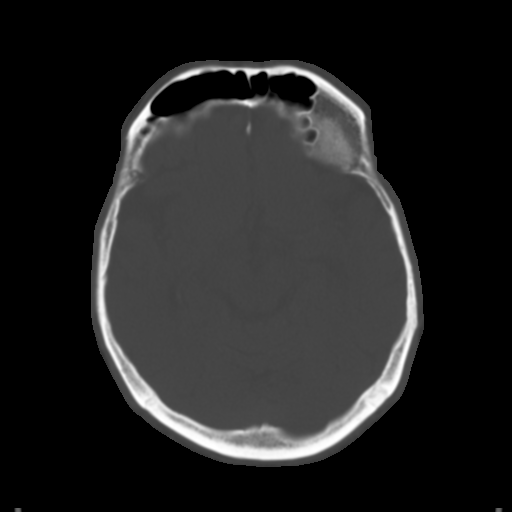
[im 21/32  brain]
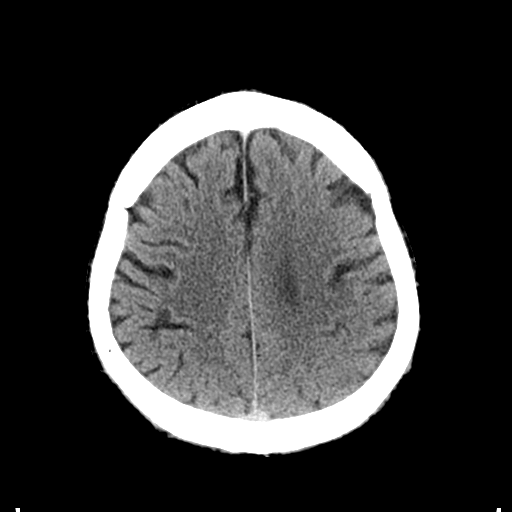

[Series 4: coronal soft tissue · coronal · 0.30mm/px · 2 of 70 slices shown]
[im 4/70  brain]
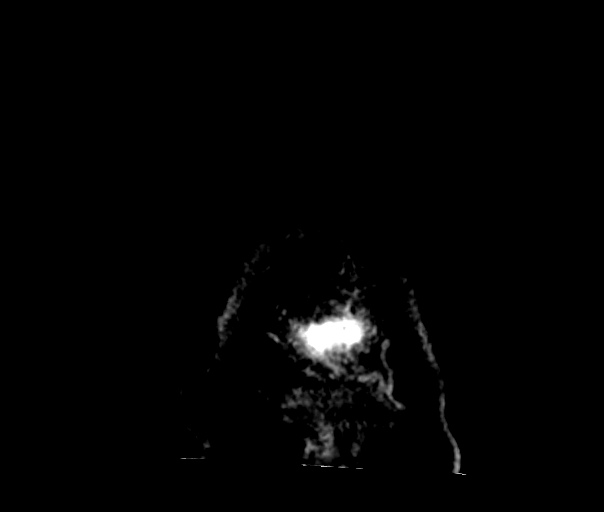
[im 8/70  brain]
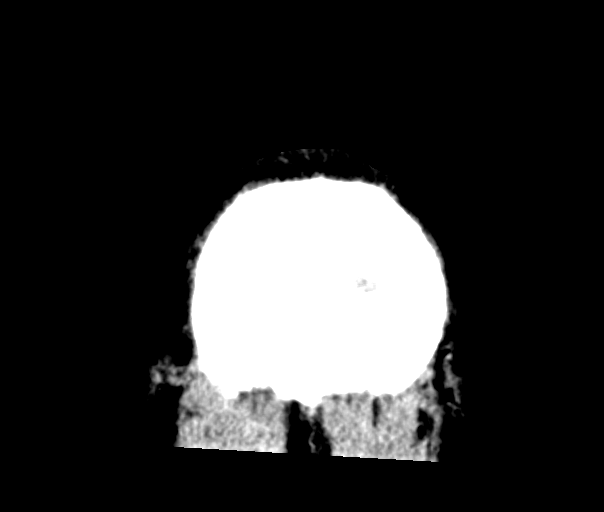

[Series 5: sagittal soft tissue · sagittal · 0.30mm/px · 1 of 62 slices shown]
[im 31/62  brain]
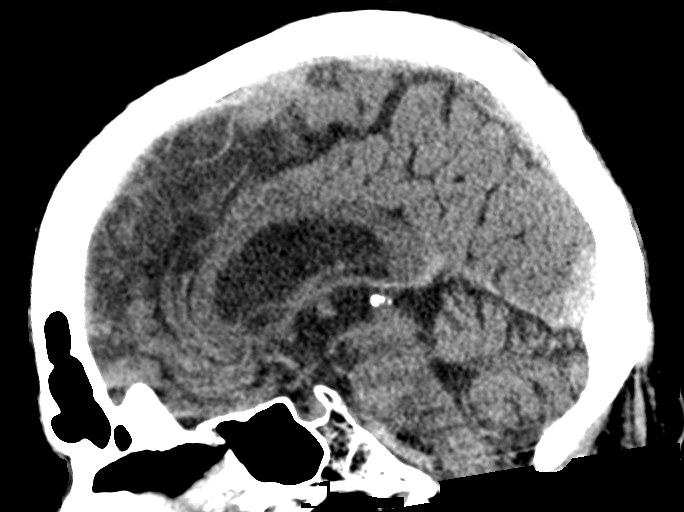

[Series 7: c spine soft · axial · 0.36mm/px · z∈[-308,-244]mm · 4 of 91 slices shown]
[im 9/91  brain]
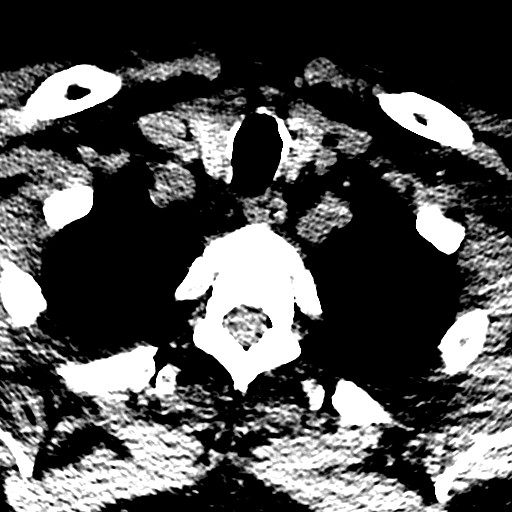
[im 17/91  brain]
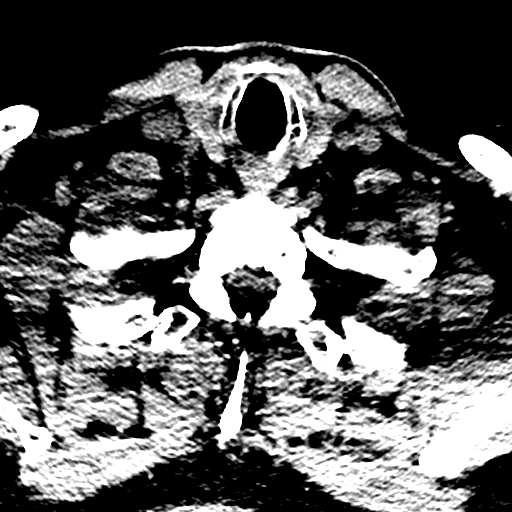
[im 33/91  brain]
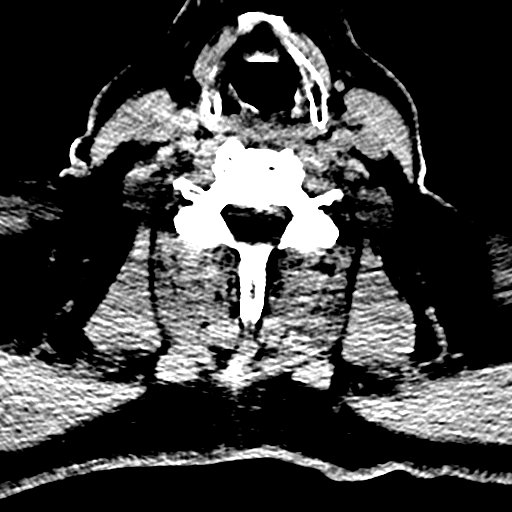
[im 41/91  brain]
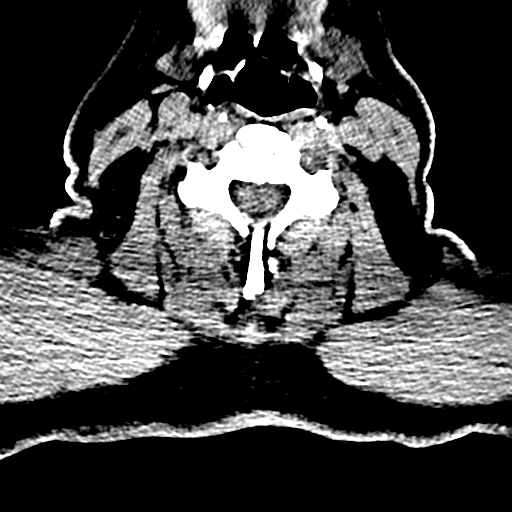

[Series 10: orthogonal bone · axial · 0.30mm/px · z∈[-343,-186]mm · 8 of 101 slices shown]
[im 8/101  bone]
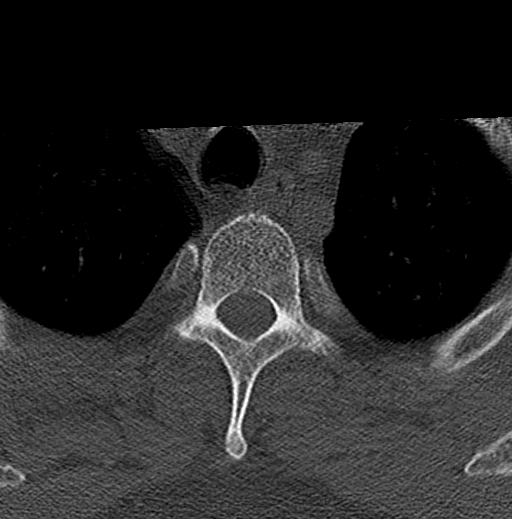
[im 24/101  bone]
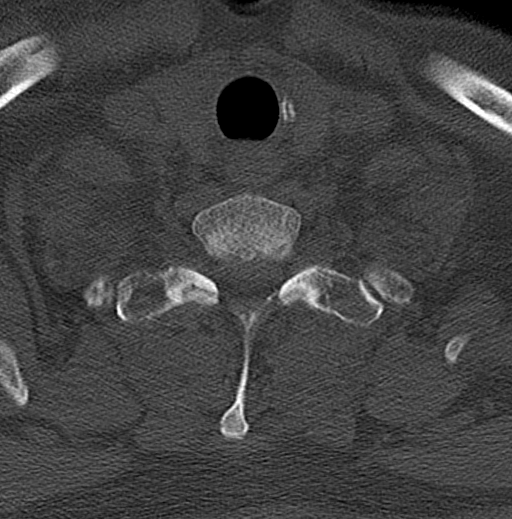
[im 31/101  bone]
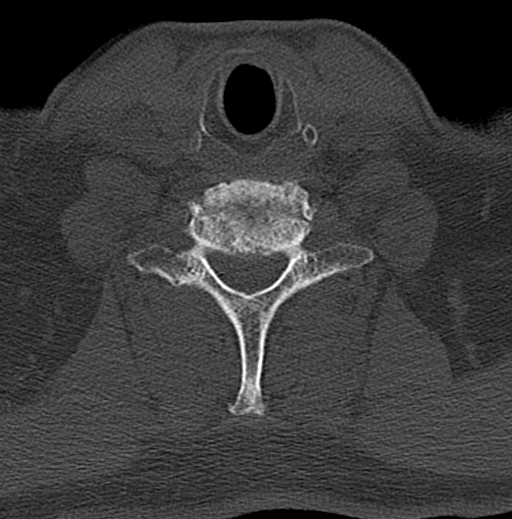
[im 47/101  bone]
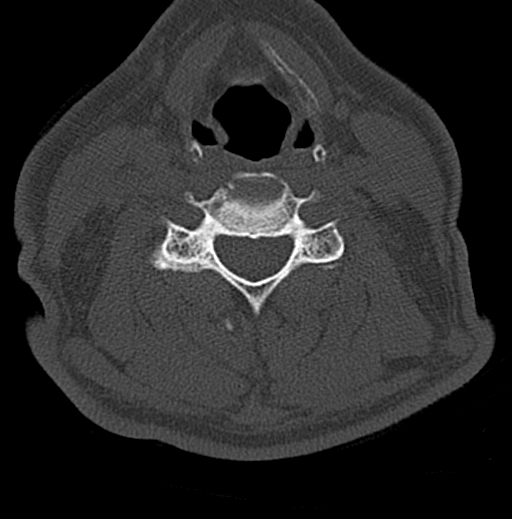
[im 54/101  bone]
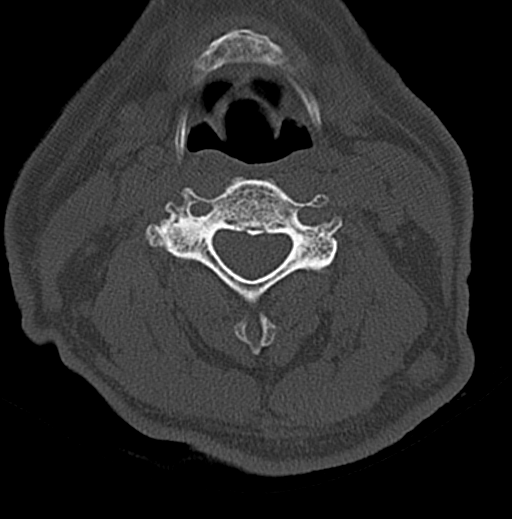
[im 70/101  bone]
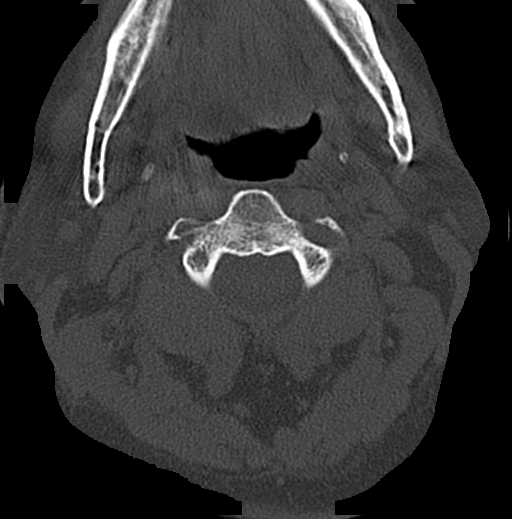
[im 77/101  bone]
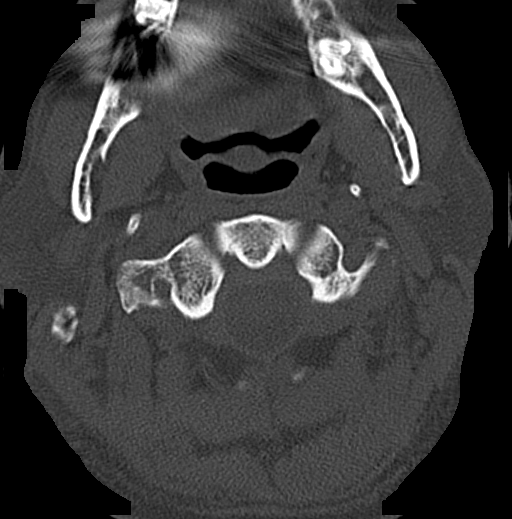
[im 93/101  bone]
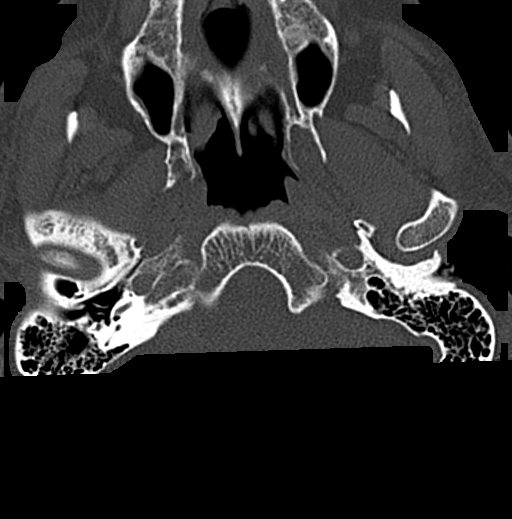

[17 of 47 positions shown; findings below may reference images not displayed]

FINDINGS: CT HEAD FINDINGS

Brain: Mild generalized atrophy and white matter disease is present
bilaterally. The ventricles are proportionate to the degree of
atrophy. Basal ganglia are intact. Insular ribbon is normal. No
acute or focal cortical abnormality is present. No significant
extraaxial fluid collection is present.

The brainstem and cerebellum are within normal limits.

Vascular: No hyperdense vessel or unexpected calcification.

Skull: Calvarium is intact. No focal lytic or blastic lesions are
present.

Sinuses/Orbits: Paranasal sinuses and mastoid air cells are clear.
The globes and orbits are within normal limits.

CT CERVICAL SPINE FINDINGS

Alignment: AP alignment is anatomic.

Skull base and vertebrae: Craniocervical junction is normal.
Vertebral body heights are maintained. No acute or healing fractures
are present.

Soft tissues and spinal canal: No prevertebral fluid or swelling. No
visible canal hematoma.

Disc levels: Endplate changes and disc osteophyte complex is
greatest at C3-4, C5-6, and C6-7. Osseous foraminal narrowing is
present bilaterally at these levels.

Upper chest: Lung apices are clear.  Thoracic inlet is unremarkable.
IMPRESSION: 1. No acute trauma to the head or cervical spine.
2. Stable atrophy and white matter disease.
3. Chronic degenerative changes of the cervical spine are most
evident at C3-4, C5-6, and C6-7.

## 2019-11-08 ENCOUNTER — Other Ambulatory Visit: Payer: Medicare Other

## 2020-01-19 ENCOUNTER — Other Ambulatory Visit: Payer: Medicare Other | Attending: Internal Medicine

## 2020-01-23 ENCOUNTER — Encounter: Admission: RE | Payer: Self-pay | Source: Home / Self Care

## 2020-01-23 ENCOUNTER — Ambulatory Visit: Admission: RE | Admit: 2020-01-23 | Payer: Medicare Other | Source: Home / Self Care | Admitting: Internal Medicine

## 2020-01-23 SURGERY — COLONOSCOPY WITH PROPOFOL
Anesthesia: General

## 2021-04-09 ENCOUNTER — Other Ambulatory Visit: Payer: Self-pay

## 2021-04-09 ENCOUNTER — Ambulatory Visit
Admission: EM | Admit: 2021-04-09 | Discharge: 2021-04-09 | Disposition: A | Discharge status: Home / Self Care | Payer: Medicare Other | Attending: Student | Admitting: Student

## 2021-04-09 ENCOUNTER — Encounter: Payer: Self-pay | Admitting: Emergency Medicine

## 2021-04-09 DIAGNOSIS — R066 Hiccough: Secondary | ICD-10-CM

## 2021-04-09 MED ORDER — BACLOFEN 10 MG PO TABS: 10.0000 mg | ORAL_TABLET | Freq: Two times a day (BID) | ORAL | 0 refills | Status: AC

## 2021-04-09 MED ORDER — METOCLOPRAMIDE HCL 10 MG PO TABS: 10.0000 mg | ORAL_TABLET | Freq: Three times a day (TID) | ORAL | 0 refills | Status: AC

## 2021-04-09 NOTE — ED Triage Notes (Signed)
Pt presents today with constant hiccups x 3 days. Denies n/v/d

## 2021-04-09 NOTE — ED Provider Notes (Signed)
[facility]    CSN: [id number] Arrival date & time: 04/09/21  1722      History   Chief Complaint Chief Complaint  Patient presents with   Hiccups    HPI Pedro Mccoy is a 70 y.o. male who presents today for evaluation of hiccups.  The patient states that this has been ongoing over the past 3 days.  He does state that this occurred before in the 1990s after he returned from [facility].  He states that a provider gave him a pill which relieved the hiccups.  He denies any heartburn symptoms at today's visit.  He denies any chest pain, shortness of breath, nausea, vomiting, headache or vision changes.  He has been able to eat he has been able to drink.  He states that the hiccups are "annoying".  HPI  Past Medical History:  Diagnosis Date   Hypertension    Hypertriglyceridemia .    Patient Active Problem List   Diagnosis Date Noted   Altered mental state 09/10/2018   Schizophrenia [facility])     Past Surgical History:  Procedure Laterality Date   NO PAST SURGERIES         Home Medications    Prior to Admission medications   Medication Sig Start Date End Date Taking? Authorizing Provider  baclofen (LIORESAL) 10 MG tablet Take 1 tablet (10 mg total) by mouth 2 (two) times daily. 04/09/21  Yes Lattie Corns, PA-C  metoCLOPramide (REGLAN) 10 MG tablet Take 1 tablet (10 mg total) by mouth 3 (three) times daily before meals. 04/09/21  Yes Lattie Corns, PA-C  benztropine (COGENTIN) 0.5 MG tablet Take 0.5 mg by mouth 2 (two) times a day. 10/20/18   [provider]  Eszopiclone 3 MG TABS Take 3 mg by mouth at bedtime. 10/20/18   [provider]  FLUoxetine (PROZAC) 20 MG capsule Take 20 mg by mouth daily. 08/17/18   [provider]  fluvoxaMINE (LUVOX) 100 MG tablet Take 100 mg by mouth 2 (two) times a day. 10/20/18   [provider]  haloperidol (HALDOL) 5 MG tablet Take 2.5 mg by mouth daily. 10/20/18   [provider]  lamoTRIgine (LAMICTAL) 200 MG tablet Take 200 mg by mouth 2 (two) times a day. 10/20/18   [provider]  LORazepam (ATIVAN) 1 MG tablet Take 1-1.5 mg by mouth daily as needed for anxiety. 10/20/18   [provider]  metFORMIN (GLUCOPHAGE) 1000 MG tablet Take 1,000 mg by mouth 2 (two) times a day.    [provider]  metoprolol succinate (TOPROL-XL) 50 MG 24 hr tablet Take 50 mg by mouth daily. Take with or immediately following a meal.    [provider]  NIFEdipine (PROCARDIA-XL/ADALAT CC) 30 MG 24 hr tablet Take 30 mg by mouth daily.    [provider]  omega-3 acid ethyl esters (LOVAZA) 1 g capsule Take 1 g by mouth daily.    [provider]  risperiDONE (RISPERDAL) 1 MG tablet Take 1 mg by mouth 2 (two) times a day. 10/20/18   [provider]  rosuvastatin (CRESTOR) 20 MG tablet Take 20 mg by mouth daily.    [provider]  telmisartan (MICARDIS) 20 MG tablet Take 20 mg by mouth daily.    [provider]    Family History Family History  Problem Relation Age of Onset   COPD Mother    Heart attack Father     Social History Social  History   Tobacco Use   Smoking status: Never   Smokeless tobacco: Never  Vaping Use   Vaping Use: Never used  Substance Use Topics   Alcohol use: No   Drug use: No     Allergies   Pioglitazone, Ace inhibitors, Codeine, and Sitagliptin   Review of Systems Review of Systems  Constitutional:  Negative for fever.  Respiratory:  Negative for cough, shortness of breath, wheezing and stridor.   Gastrointestinal:  Negative for diarrhea, nausea and vomiting.  All other systems reviewed and are negative.   Physical Exam Triage Vital Signs ED Triage Vitals  Enc Vitals Group     BP 04/09/21 1835 (!) 132/91     Pulse Rate 04/09/21 1835 73     Resp 04/09/21 1835 18     Temp 04/09/21 1835 98.8 F (37.1 C)     Temp Source 04/09/21 1835 Oral     SpO2  04/09/21 1835 99 %     Weight --      Height --      Head Circumference --      Peak Flow --      Pain Score 04/09/21 1833 0     Pain Loc --      Pain Edu? --      Excl. in [place]? --    No data found.  Updated Vital Signs BP (!) 132/91 (BP Location: Left Arm)   Pulse 73   Temp 98.8 F (37.1 C) (Oral)   Resp 18   SpO2 99%   Visual Acuity Right Eye Distance:   Left Eye Distance:   Bilateral Distance:    Right Eye Near:   Left Eye Near:    Bilateral Near:     Physical Exam Constitutional:      Appearance: Normal appearance. He is not ill-appearing or toxic-appearing.  HENT:     Head: Normocephalic.     Right Ear: Tympanic membrane normal.     Left Ear: Tympanic membrane normal.     Nose: Nose normal. No congestion or rhinorrhea.     Mouth/Throat:     Mouth: Mucous membranes are moist.     Pharynx: No oropharyngeal exudate or posterior oropharyngeal erythema.  Eyes:     Pupils: Pupils are equal, round, and reactive to light.  Cardiovascular:     Rate and Rhythm: Normal rate and regular rhythm.     Heart sounds: No murmur heard.   No friction rub. No gallop.  Pulmonary:     Effort: Pulmonary effort is normal.     Breath sounds: Normal breath sounds. No wheezing or rales.     Comments: Patient with very noticeable hiccups. Musculoskeletal:     Cervical back: Normal range of motion.  Lymphadenopathy:     Cervical: No cervical adenopathy.  Neurological:     Mental Status: He is alert.   UC Treatments / Results  Labs (all labs ordered are listed, but only abnormal results are displayed) Labs Reviewed - No data to display  EKG   Radiology No results found.  Procedures Procedures (including critical care time)  Medications Ordered in UC Medications - No data to display  Initial Impression / Assessment and Plan / UC Course  I have reviewed the triage vital signs and the nursing notes.  Pertinent labs & imaging results that were available during my care of  the patient were reviewed by me and considered in my medical decision making (see chart for details).  1.  Treatment options were discussed today with the patient. 2.  The patient has been experiencing Consistently over the past 3 Days. 3.  Prescribed Lioresal for the patient to try first, if he does not experience relief after 48 hours the patient was also given a prescription of Reglan to begin. 4.  Follow-up if no improvement of symptoms. Final Clinical Impressions(s) / UC Diagnoses   Final diagnoses:  Hiccups     Discharge Instructions      -I have sent you in 2 different medications.  1 medication is called Lioresal, the other medication is called Reglan.  Try the Lioresal first and if it does not improve your hiccups over 48 hours then you can attempt to take the Reglan as well. -Make sure that you are taking your time to eat and swallow your food. -Drink plenty of water.   ED Prescriptions     Medication Sig Dispense Auth. Provider   baclofen (LIORESAL) 10 MG tablet Take 1 tablet (10 mg total) by mouth 2 (two) times daily. 20 tablet Lattie Corns, PA-C   metoCLOPramide (REGLAN) 10 MG tablet Take 1 tablet (10 mg total) by mouth 3 (three) times daily before meals. 30 tablet Lattie Corns, [place]      PDMP not reviewed this encounter.   Lattie Corns, PA-C 04/09/21 1900

## 2021-04-09 NOTE — Discharge Instructions (Signed)
-  I have sent you in 2 different medications.  1 medication is called Lioresal, the other medication is called Reglan.  Try the Lioresal first and if it does not improve your hiccups over 48 hours then you can attempt to take the Reglan as well. -Make sure that you are taking your time to eat and swallow your food. -Drink plenty of water.

## 2021-05-20 ENCOUNTER — Emergency Department: Payer: Medicare Other

## 2021-05-20 ENCOUNTER — Other Ambulatory Visit: Payer: Self-pay

## 2021-05-20 ENCOUNTER — Inpatient Hospital Stay: Payer: Medicare Other

## 2021-05-20 ENCOUNTER — Inpatient Hospital Stay
Admission: EM | Admit: 2021-05-20 | Discharge: 2021-05-23 | DRG: 522 | Disposition: A | Discharge status: Home Health | Payer: Medicare Other | Attending: Internal Medicine | Admitting: Internal Medicine

## 2021-05-20 ENCOUNTER — Encounter: Payer: Self-pay | Admitting: *Deleted

## 2021-05-20 DIAGNOSIS — W19XXXA Unspecified fall, initial encounter: Secondary | ICD-10-CM

## 2021-05-20 DIAGNOSIS — K8689 Other specified diseases of pancreas: Secondary | ICD-10-CM | POA: Diagnosis present

## 2021-05-20 DIAGNOSIS — S72001A Fracture of unspecified part of neck of right femur, initial encounter for closed fracture: Secondary | ICD-10-CM

## 2021-05-20 DIAGNOSIS — E781 Pure hyperglyceridemia: Secondary | ICD-10-CM | POA: Diagnosis present

## 2021-05-20 DIAGNOSIS — R6510 Systemic inflammatory response syndrome (SIRS) of non-infectious origin without acute organ dysfunction: Secondary | ICD-10-CM | POA: Diagnosis present

## 2021-05-20 DIAGNOSIS — S72011A Unspecified intracapsular fracture of right femur, initial encounter for closed fracture: Secondary | ICD-10-CM | POA: Diagnosis present

## 2021-05-20 DIAGNOSIS — I959 Hypotension, unspecified: Secondary | ICD-10-CM | POA: Diagnosis present

## 2021-05-20 DIAGNOSIS — E785 Hyperlipidemia, unspecified: Secondary | ICD-10-CM | POA: Diagnosis present

## 2021-05-20 DIAGNOSIS — W06XXXA Fall from bed, initial encounter: Secondary | ICD-10-CM | POA: Diagnosis present

## 2021-05-20 DIAGNOSIS — I248 Other forms of acute ischemic heart disease: Secondary | ICD-10-CM | POA: Diagnosis present

## 2021-05-20 DIAGNOSIS — I214 Non-ST elevation (NSTEMI) myocardial infarction: Secondary | ICD-10-CM

## 2021-05-20 DIAGNOSIS — Y92009 Unspecified place in unspecified non-institutional (private) residence as the place of occurrence of the external cause: Secondary | ICD-10-CM

## 2021-05-20 DIAGNOSIS — S50311A Abrasion of right elbow, initial encounter: Secondary | ICD-10-CM | POA: Diagnosis present

## 2021-05-20 DIAGNOSIS — M4856XA Collapsed vertebra, not elsewhere classified, lumbar region, initial encounter for fracture: Secondary | ICD-10-CM | POA: Diagnosis present

## 2021-05-20 DIAGNOSIS — T796XXA Traumatic ischemia of muscle, initial encounter: Secondary | ICD-10-CM

## 2021-05-20 DIAGNOSIS — E872 Acidosis, unspecified: Secondary | ICD-10-CM | POA: Diagnosis present

## 2021-05-20 DIAGNOSIS — E1165 Type 2 diabetes mellitus with hyperglycemia: Secondary | ICD-10-CM | POA: Diagnosis present

## 2021-05-20 DIAGNOSIS — F203 Undifferentiated schizophrenia: Secondary | ICD-10-CM | POA: Diagnosis not present

## 2021-05-20 DIAGNOSIS — S50312A Abrasion of left elbow, initial encounter: Secondary | ICD-10-CM | POA: Diagnosis present

## 2021-05-20 DIAGNOSIS — I10 Essential (primary) hypertension: Secondary | ICD-10-CM | POA: Diagnosis present

## 2021-05-20 DIAGNOSIS — S80211A Abrasion, right knee, initial encounter: Secondary | ICD-10-CM | POA: Diagnosis present

## 2021-05-20 DIAGNOSIS — K869 Disease of pancreas, unspecified: Secondary | ICD-10-CM | POA: Diagnosis present

## 2021-05-20 DIAGNOSIS — Z825 Family history of asthma and other chronic lower respiratory diseases: Secondary | ICD-10-CM

## 2021-05-20 DIAGNOSIS — S20221A Contusion of right back wall of thorax, initial encounter: Secondary | ICD-10-CM | POA: Diagnosis present

## 2021-05-20 DIAGNOSIS — A419 Sepsis, unspecified organism: Secondary | ICD-10-CM | POA: Diagnosis not present

## 2021-05-20 DIAGNOSIS — S40011A Contusion of right shoulder, initial encounter: Secondary | ICD-10-CM | POA: Diagnosis present

## 2021-05-20 DIAGNOSIS — R4182 Altered mental status, unspecified: Secondary | ICD-10-CM

## 2021-05-20 DIAGNOSIS — Z20822 Contact with and (suspected) exposure to covid-19: Secondary | ICD-10-CM | POA: Diagnosis present

## 2021-05-20 DIAGNOSIS — Z888 Allergy status to other drugs, medicaments and biological substances status: Secondary | ICD-10-CM

## 2021-05-20 DIAGNOSIS — Z885 Allergy status to narcotic agent status: Secondary | ICD-10-CM

## 2021-05-20 DIAGNOSIS — D649 Anemia, unspecified: Secondary | ICD-10-CM | POA: Diagnosis not present

## 2021-05-20 DIAGNOSIS — S80212A Abrasion, left knee, initial encounter: Secondary | ICD-10-CM | POA: Diagnosis present

## 2021-05-20 DIAGNOSIS — R778 Other specified abnormalities of plasma proteins: Secondary | ICD-10-CM | POA: Diagnosis not present

## 2021-05-20 DIAGNOSIS — Y92013 Bedroom of single-family (private) house as the place of occurrence of the external cause: Secondary | ICD-10-CM

## 2021-05-20 DIAGNOSIS — Z419 Encounter for procedure for purposes other than remedying health state, unspecified: Secondary | ICD-10-CM

## 2021-05-20 DIAGNOSIS — F419 Anxiety disorder, unspecified: Secondary | ICD-10-CM | POA: Diagnosis present

## 2021-05-20 DIAGNOSIS — F32A Depression, unspecified: Secondary | ICD-10-CM | POA: Diagnosis present

## 2021-05-20 DIAGNOSIS — G9340 Encephalopathy, unspecified: Secondary | ICD-10-CM

## 2021-05-20 DIAGNOSIS — R748 Abnormal levels of other serum enzymes: Secondary | ICD-10-CM | POA: Diagnosis present

## 2021-05-20 DIAGNOSIS — R251 Tremor, unspecified: Secondary | ICD-10-CM | POA: Diagnosis present

## 2021-05-20 DIAGNOSIS — S301XXA Contusion of abdominal wall, initial encounter: Secondary | ICD-10-CM | POA: Diagnosis present

## 2021-05-20 DIAGNOSIS — R6520 Severe sepsis without septic shock: Secondary | ICD-10-CM

## 2021-05-20 DIAGNOSIS — E860 Dehydration: Secondary | ICD-10-CM | POA: Diagnosis present

## 2021-05-20 DIAGNOSIS — R296 Repeated falls: Secondary | ICD-10-CM | POA: Diagnosis present

## 2021-05-20 DIAGNOSIS — Z79899 Other long term (current) drug therapy: Secondary | ICD-10-CM

## 2021-05-20 DIAGNOSIS — Z8249 Family history of ischemic heart disease and other diseases of the circulatory system: Secondary | ICD-10-CM

## 2021-05-20 DIAGNOSIS — G8918 Other acute postprocedural pain: Secondary | ICD-10-CM

## 2021-05-20 DIAGNOSIS — M6282 Rhabdomyolysis: Secondary | ICD-10-CM | POA: Diagnosis present

## 2021-05-20 DIAGNOSIS — F209 Schizophrenia, unspecified: Secondary | ICD-10-CM | POA: Diagnosis present

## 2021-05-20 DIAGNOSIS — Z7984 Long term (current) use of oral hypoglycemic drugs: Secondary | ICD-10-CM

## 2021-05-20 DIAGNOSIS — R739 Hyperglycemia, unspecified: Secondary | ICD-10-CM

## 2021-05-20 LAB — LACTIC ACID, PLASMA
Lactic Acid, Venous: 5 mmol/L (ref 0.5–1.9)
Lactic Acid, Venous: 3 mmol/L (ref 0.5–1.9)
Lactic Acid, Venous: 4.3 mmol/L (ref 0.5–1.9)

## 2021-05-20 LAB — CBC WITH DIFFERENTIAL/PLATELET
Abs Immature Granulocytes: 0.24 10*3/uL — ABNORMAL HIGH (ref 0.00–0.07)
Basophils Absolute: 0.1 10*3/uL (ref 0.0–0.1)
Basophils Relative: 0 %
Eosinophils Absolute: 0 10*3/uL (ref 0.0–0.5)
Eosinophils Relative: 0 %
HCT: 47.1 % (ref 39.0–52.0)
Hemoglobin: 16.1 g/dL (ref 13.0–17.0)
Immature Granulocytes: 1 %
Lymphocytes Relative: 2 %
Lymphs Abs: 0.5 10*3/uL — ABNORMAL LOW (ref 0.7–4.0)
MCH: 29.4 pg (ref 26.0–34.0)
MCHC: 34.2 g/dL (ref 30.0–36.0)
MCV: 86.1 fL (ref 80.0–100.0)
Monocytes Absolute: 1.7 10*3/uL — ABNORMAL HIGH (ref 0.1–1.0)
Monocytes Relative: 7 %
Neutro Abs: 23 10*3/uL — ABNORMAL HIGH (ref 1.7–7.7)
Neutrophils Relative %: 90 %
Platelets: 200 10*3/uL (ref 150–400)
RBC: 5.47 MIL/uL (ref 4.22–5.81)
RDW: 13.3 % (ref 11.5–15.5)
Smear Review: NORMAL
WBC: 25.6 10*3/uL — ABNORMAL HIGH (ref 4.0–10.5)
nRBC: 0 % (ref 0.0–0.2)

## 2021-05-20 LAB — URINALYSIS, ROUTINE W REFLEX MICROSCOPIC
Bilirubin Urine: NEGATIVE
Glucose, UA: 50 mg/dL — AB
Ketones, ur: 20 mg/dL — AB
Leukocytes,Ua: NEGATIVE
Nitrite: NEGATIVE
Protein, ur: 100 mg/dL — AB
Specific Gravity, Urine: 1.009 (ref 1.005–1.030)
Squamous Epithelial / HPF: NONE SEEN (ref 0–5)
pH: 6 (ref 5.0–8.0)

## 2021-05-20 LAB — CK: Total CK: 9666 U/L — ABNORMAL HIGH (ref 49–397)

## 2021-05-20 LAB — COMPREHENSIVE METABOLIC PANEL
ALT: 43 U/L (ref 0–44)
AST: 158 U/L — ABNORMAL HIGH (ref 15–41)
Albumin: 3.8 g/dL (ref 3.5–5.0)
Alkaline Phosphatase: 58 U/L (ref 38–126)
Anion gap: 18 — ABNORMAL HIGH (ref 5–15)
BUN: 13 mg/dL (ref 8–23)
CO2: 17 mmol/L — ABNORMAL LOW (ref 22–32)
Calcium: 9.6 mg/dL (ref 8.9–10.3)
Chloride: 103 mmol/L (ref 98–111)
Creatinine, Ser: 1.22 mg/dL (ref 0.61–1.24)
GFR, Estimated: >60 mL/min (ref >60)
Glucose, Bld: 185 mg/dL — ABNORMAL HIGH (ref 70–99)
Potassium: 4.3 mmol/L (ref 3.5–5.1)
Sodium: 138 mmol/L (ref 135–145)
Total Bilirubin: 2.7 mg/dL — ABNORMAL HIGH (ref 0.3–1.2)
Total Protein: 6.8 g/dL (ref 6.5–8.1)

## 2021-05-20 LAB — RESP PANEL BY RT-PCR (FLU A&B, COVID) ARPGX2
Influenza A by PCR: NEGATIVE
Influenza B by PCR: NEGATIVE
SARS Coronavirus 2 by RT PCR: NEGATIVE

## 2021-05-20 LAB — TROPONIN I (HIGH SENSITIVITY)
Troponin I (High Sensitivity): 738 ng/L (ref <18)
Troponin I (High Sensitivity): 1141 ng/L (ref <18)

## 2021-05-20 LAB — LIPASE, BLOOD: Lipase: 22 U/L (ref 11–51)

## 2021-05-20 MED ORDER — HALOPERIDOL 2 MG PO TABS
2.5000 mg | ORAL_TABLET | Freq: Every day | ORAL | Status: DC
  Administered 2021-05-21 – 2021-05-23 (×3): 2.5 mg via ORAL
  Filled 2021-05-20 (×3): qty 1

## 2021-05-20 MED ORDER — RISPERIDONE 1 MG PO TABS
1.0000 mg | ORAL_TABLET | Freq: Every day | ORAL | Status: DC
  Administered 2021-05-20 – 2021-05-22 (×3): 1 mg via ORAL
  Filled 2021-05-20 (×4): qty 1

## 2021-05-20 MED ORDER — LAMOTRIGINE 100 MG PO TABS
200.0000 mg | ORAL_TABLET | Freq: Two times a day (BID) | ORAL | Status: DC
  Administered 2021-05-20 – 2021-05-23 (×6): 200 mg via ORAL
  Filled 2021-05-20 (×6): qty 2

## 2021-05-20 MED ORDER — LORAZEPAM 2 MG/ML IJ SOLN
2.0000 mg | Freq: Once | INTRAMUSCULAR | Status: AC
  Administered 2021-05-20: 2 mg via INTRAVENOUS
  Filled 2021-05-20: qty 1

## 2021-05-20 MED ORDER — HEPARIN SODIUM (PORCINE) 5000 UNIT/ML IJ SOLN: 5000.0000 [IU] | Freq: Three times a day (TID) | INTRAMUSCULAR | Status: DC

## 2021-05-20 MED ORDER — SODIUM CHLORIDE 0.9 % IV SOLN
2.0000 g | Freq: Once | INTRAVENOUS | Status: AC
  Administered 2021-05-20: 2 g via INTRAVENOUS
  Filled 2021-05-20: qty 2

## 2021-05-20 MED ORDER — HEPARIN BOLUS VIA INFUSION
4000.0000 [IU] | Freq: Once | INTRAVENOUS | Status: AC
  Administered 2021-05-20: 4000 [IU] via INTRAVENOUS
  Filled 2021-05-20: qty 4000

## 2021-05-20 MED ORDER — METRONIDAZOLE 500 MG/100ML IV SOLN
500.0000 mg | Freq: Two times a day (BID) | INTRAVENOUS | Status: DC
  Administered 2021-05-21 – 2021-05-22 (×3): 500 mg via INTRAVENOUS
  Filled 2021-05-20 (×3): qty 100

## 2021-05-20 MED ORDER — METRONIDAZOLE 500 MG/100ML IV SOLN
500.0000 mg | Freq: Once | INTRAVENOUS | Status: AC
  Administered 2021-05-20: 500 mg via INTRAVENOUS
  Filled 2021-05-20: qty 100

## 2021-05-20 MED ORDER — VANCOMYCIN HCL 2000 MG/400ML IV SOLN
2000.0000 mg | Freq: Once | INTRAVENOUS | Status: AC
  Administered 2021-05-20: 2000 mg via INTRAVENOUS
  Filled 2021-05-20 (×3): qty 400

## 2021-05-20 MED ORDER — ONDANSETRON HCL 4 MG PO TABS: 4.0000 mg | ORAL_TABLET | Freq: Four times a day (QID) | ORAL | Status: DC | PRN

## 2021-05-20 MED ORDER — METFORMIN HCL 500 MG PO TABS: 1000.0000 mg | ORAL_TABLET | Freq: Two times a day (BID) | ORAL | Status: DC

## 2021-05-20 MED ORDER — SODIUM CHLORIDE 0.9 % IV SOLN
2.0000 g | Freq: Three times a day (TID) | INTRAVENOUS | Status: DC
  Administered 2021-05-20 – 2021-05-22 (×4): 2 g via INTRAVENOUS
  Filled 2021-05-20 (×6): qty 2

## 2021-05-20 MED ORDER — VANCOMYCIN HCL 1750 MG/350ML IV SOLN: 1750.0000 mg | INTRAVENOUS | Status: DC

## 2021-05-20 MED ORDER — LACTATED RINGERS IV SOLN: INTRAVENOUS | Status: DC

## 2021-05-20 MED ORDER — IRBESARTAN 75 MG PO TABS
75.0000 mg | ORAL_TABLET | Freq: Every day | ORAL | Status: DC
  Filled 2021-05-20: qty 1

## 2021-05-20 MED ORDER — HEPARIN (PORCINE) 25000 UT/250ML-% IV SOLN
1550.0000 [IU]/h | INTRAVENOUS | Status: DC
  Administered 2021-05-20 (×2): 1200 [IU]/h via INTRAVENOUS
  Filled 2021-05-20 (×2): qty 250

## 2021-05-20 MED ORDER — METOPROLOL SUCCINATE ER 50 MG PO TB24
50.0000 mg | ORAL_TABLET | Freq: Every day | ORAL | Status: DC
  Administered 2021-05-21 – 2021-05-23 (×3): 50 mg via ORAL
  Filled 2021-05-20 (×3): qty 1

## 2021-05-20 MED ORDER — SODIUM CHLORIDE 0.9 % IV BOLUS (SEPSIS)
1000.0000 mL | Freq: Once | INTRAVENOUS | Status: AC
  Administered 2021-05-20: 1000 mL via INTRAVENOUS

## 2021-05-20 MED ORDER — ACETAMINOPHEN 325 MG PO TABS
650.0000 mg | ORAL_TABLET | Freq: Four times a day (QID) | ORAL | Status: DC | PRN
  Administered 2021-05-22 – 2021-05-23 (×2): 650 mg via ORAL
  Filled 2021-05-20 (×2): qty 2

## 2021-05-20 MED ORDER — FLUVOXAMINE MALEATE 50 MG PO TABS
100.0000 mg | ORAL_TABLET | Freq: Two times a day (BID) | ORAL | Status: DC
  Administered 2021-05-20 – 2021-05-23 (×6): 100 mg via ORAL
  Filled 2021-05-20 (×9): qty 2

## 2021-05-20 MED ORDER — LORAZEPAM 1 MG PO TABS
1.0000 mg | ORAL_TABLET | Freq: Every day | ORAL | Status: DC | PRN
Start: 2021-05-20 — End: 2021-05-23
  Administered 2021-05-22: 1 mg via ORAL
  Filled 2021-05-20: qty 1

## 2021-05-20 MED ORDER — FLUOXETINE HCL 20 MG PO CAPS
20.0000 mg | ORAL_CAPSULE | Freq: Every day | ORAL | Status: DC
  Administered 2021-05-21 – 2021-05-23 (×3): 20 mg via ORAL
  Filled 2021-05-20 (×3): qty 1

## 2021-05-20 MED ORDER — SODIUM CHLORIDE 0.9 % IV BOLUS
1000.0000 mL | Freq: Once | INTRAVENOUS | Status: AC
  Administered 2021-05-20: 1000 mL via INTRAVENOUS

## 2021-05-20 MED ORDER — MORPHINE SULFATE (PF) 2 MG/ML IV SOLN: 0.5000 mg | INTRAVENOUS | Status: DC | PRN

## 2021-05-20 MED ORDER — ROSUVASTATIN CALCIUM 20 MG PO TABS: 20.0000 mg | ORAL_TABLET | Freq: Every day | ORAL | Status: DC

## 2021-05-20 MED ORDER — IOHEXOL 300 MG/ML  SOLN
100.0000 mL | Freq: Once | INTRAMUSCULAR | Status: AC | PRN
  Administered 2021-05-20: 100 mL via INTRAVENOUS
  Filled 2021-05-20: qty 100

## 2021-05-20 MED ORDER — NIFEDIPINE ER OSMOTIC RELEASE 30 MG PO TB24
30.0000 mg | ORAL_TABLET | Freq: Every day | ORAL | Status: DC
  Filled 2021-05-20: qty 1

## 2021-05-20 MED ORDER — OXYCODONE-ACETAMINOPHEN 5-325 MG PO TABS
1.0000 | ORAL_TABLET | Freq: Four times a day (QID) | ORAL | Status: AC | PRN
  Administered 2021-05-20 – 2021-05-21 (×2): 1 via ORAL
  Filled 2021-05-20 (×2): qty 1

## 2021-05-20 MED ORDER — OMEGA-3-ACID ETHYL ESTERS 1 G PO CAPS
1.0000 g | ORAL_CAPSULE | Freq: Every day | ORAL | Status: DC
  Administered 2021-05-22 – 2021-05-23 (×2): 1 g via ORAL
  Filled 2021-05-20 (×3): qty 1

## 2021-05-20 MED ORDER — ACETAMINOPHEN 650 MG RE SUPP
650.0000 mg | Freq: Four times a day (QID) | RECTAL | Status: DC | PRN
  Filled 2021-05-20: qty 1

## 2021-05-20 MED ORDER — ONDANSETRON HCL 4 MG/2ML IJ SOLN: 4.0000 mg | Freq: Four times a day (QID) | INTRAMUSCULAR | Status: DC | PRN

## 2021-05-20 MED ORDER — MORPHINE SULFATE (PF) 2 MG/ML IV SOLN
1.0000 mg | INTRAVENOUS | Status: AC | PRN
  Administered 2021-05-20: 20:00:00 1 mg via INTRAVENOUS
  Filled 2021-05-20: qty 1

## 2021-05-20 MED ORDER — VANCOMYCIN HCL IN DEXTROSE 1-5 GM/200ML-% IV SOLN
1000.0000 mg | Freq: Once | INTRAVENOUS | Status: DC
  Filled 2021-05-20: qty 200

## 2021-05-20 MED ORDER — LACTATED RINGERS IV SOLN: INTRAVENOUS | Status: AC

## 2021-05-20 NOTE — ED Notes (Signed)
Pt to CT

## 2021-05-20 NOTE — ED Notes (Signed)
Md in with pt again    iv meds infusing

## 2021-05-20 NOTE — ED Notes (Signed)
Pt back from CT- Dr. Bridgett Larsson paged about pausing heparin gtt until CT results. Med paused prior to patient going to scan.

## 2021-05-20 NOTE — Sepsis Progress Note (Signed)
Communication took place with Dr. Bridgett Larsson via secure chat, additional lactic ordered after making MD aware of 2nd lactic, Bedside RN was also notified via secure chat with need for additional lactic

## 2021-05-20 NOTE — H&P (Addendum)
History and Physical   Pedro Mccoy ULA:[id number] DOB: August 10, 1950 DOA: 05/20/2021  PCP: Merryl Hacker, No  Outpatient Specialists: Dr. Alice Reichert, gastroenterology Patient coming from: Aunt's house via EMS  I have personally briefly reviewed patient's old medical records in [place].  Chief Concern: Fall  HPI: Pedro Mccoy is a 70 y.o. male with medical history significant for hypertension, hyperlipidemia, non-insulin-dependent diabetes mellitus, schizophrenia, anxiety, depression, who presents to the emergency department from home for chief concerns of a fall.  He was staying there overnight. One of her dogs got under his feet and he fell on the floor and he couldn't get up.  He is unsure of loss of consciousness or syncope.  He states he may have fallen asleep.  He denies fever, chills, dysphagia, dysuria, diarrhea, hematuria, chest pain, shortness of breath, headache, vision changes.   At bedside, he is able to tell me his name, age, location.  He provided the full exam and discussed all his medications with me at bedside.  Social history: He lives by himself normally. He has been staying with his aunt at night to help take care of her since she broke both her hips last years. He denies history tobacco use, etoh, recreational drug use. He is retired and formerly worked in the [employer].   Vaccination history: He is vaccinated for covid 19.   ROS: Constitutional: no weight change, no fever ENT/Mouth: no sore throat, no rhinorrhea Eyes: no eye pain, no vision changes Cardiovascular: no chest pain, no dyspnea,  no edema, no palpitations Respiratory: no cough, no sputum, no wheezing Gastrointestinal: no nausea, no vomiting, no diarrhea, no constipation Genitourinary: no urinary incontinence, no dysuria, no hematuria Musculoskeletal: no arthralgias, no myalgias Skin: no skin lesions, no pruritus, Neuro: + weakness, no loss of consciousness, no syncope Psych: no anxiety, no  depression, + decrease appetite Heme/Lymph: no bruising, no bleeding  ED Course: Discussed with emergency medicine provider, patient requiring hospitalization for chief concerns of meeting sepsis criteria and right impacted subcapital femoral neck fracture.  Vitals in the emergency department was remarkable for temperature of 98.6, respiration rate of 18, heart rate of 100, blood pressure 129/78, SPO2 of 96% on room air.  Labs in the emergency department was remarkable for serum sodium 138, potassium 4.8, chloride 103, bicarb 17, BUN of 13, serum creatinine of 1.22, nonfasting blood glucose 185, GFR greater than 60.  WBC was elevated at 25.6, hemoglobin 16.1, platelets 200.  UA was negative for leukocytes and nitrates.  In the emergency department patient received sodium chloride bolus, a total of 3 L.  ED provider gave patient broad-spectrum antibiotics with IV cefepime, metronidazole 500 mg IV once, vancomycin IV.  Assessment/Plan  Principal Problem:   Sepsis ([place]) Active Problems:   Altered mental state   Schizophrenia ([place])   Fall at home, initial encounter   Fracture of femoral neck, right, closed ([facility])   Hyperglycemia   Elevated CK   Rhabdomyolysis   Elevated troponin   # Patient met SIRS/severe sepsis criteria - Elevated heart rate, leukocytosis of 25.6, elevated lactic acid of 5.0, organ involvement of heart via elevated troponin - Etiology work-up in progress - UA was negative for nitrates and leukocytes - Blood cultures x2, check procalcitonin - Continue broad-spectrum antibiotics with IV vancomycin, cefepime, metronidazole - CT of the abdomen pelvis with contrast has been ordered  # Elevated troponin-continue to follow, query secondary to severe sepsis # NSTEMI -Treat as above - If second troponin is elevated, we  will initiate heparin GTT - Complete echo ordered - Scheduled repeat high since troponin 4 2300 on 05/20/2021, to lateral for heparin gtt. to take  effect - Discussed with cross coverage provider  # Received page from radiology regarding 2.7 cm mass in the body of the pancreas with low-density, which may represent neoplasm or focal collection - It is recommended from radiology to do an MRI pancreatic protocol - Once patient's pain is more controlled, would recommend a.m. team to order MRI pancreatic protocol if indicated especially if patient has poor follow-up - Ordered lipase  # Rhabdomyolysis-presumed secondary to traumatic falling off the bed from home - Status post 3 L total of sodium chloride - LR at 150 mL/h  - CK in the a.m.  # Right femoral neck impacted subcapital fracture-present on admission - Orthopedic, Dr. Rudene Christians has been consulted for further evaluation # History of hypertension-patient takes telmisartan 20 mg daily, metoprolol succinate 50 mg daily, nifedipine 30 mg daily  # History of non-insulin-dependent diabetes mellitus-resumed home metformin 1000 mg p.o. twice daily with meals  # Right upper quadrant and right back pain with palpation - Diffuse ecchymosis - CT of the abdomen pelvis with contrast ordered  # Hyperlipidemia-rosuvastatin 20 mg nightly  # History of schizophrenia-resumed haloperidol 2.5 mg p.o. daily, risperidone 1 mg nightly, lamotrigine 200 mg p.o. twice daily  # Depression/anxiety-resumed home fluoxetine 20 mg daily, fluvoxamine 100 mg daily - Resumed home Ativan 1-1.5 mg p.o. daily as needed for anxiety  # Diffuse ecchymosis at right shoulder, right upper quadrant and right mid lateral back - poa  # Diffuse scratches on bilateral knees, arms and elbows - poa   # L1 chronic appearing compression-I did physical exam of his thoracic and lumbar spine and patient did not endorse any focal pain on his back - Outpatient follow-up  Chart reviewed.   DVT prophylaxis: Heparin 5000 units subcutaneous every 8 hours Code Status: full code  Diet: Heart healthy/carb modified Family Communication:  no Disposition Plan: Pending clinical course Consults called: Orthopedic service Admission status: Medical telemetry, inpatient  Past Medical History:  Diagnosis Date   Hypertension    Hypertriglyceridemia .   Past Surgical History:  Procedure Laterality Date   NO PAST SURGERIES     Social History:  reports that he has never smoked. He has never used smokeless tobacco. He reports that he does not drink alcohol and does not use drugs.  Allergies  Allergen Reactions   Pioglitazone Nausea Only   Ace Inhibitors    Codeine    Sitagliptin Nausea And Vomiting   Family History  Problem Relation Age of Onset   COPD Mother    Heart attack Father    Family history: Family history reviewed and not pertinent  Prior to Admission medications   Medication Sig Start Date End Date Taking? Authorizing Provider  baclofen (LIORESAL) 10 MG tablet Take 1 tablet (10 mg total) by mouth 2 (two) times daily. 04/09/21   Lattie Corns, PA-C  benztropine (COGENTIN) 0.5 MG tablet Take 0.5 mg by mouth 2 (two) times a day. 10/20/18   [provider]  Eszopiclone 3 MG TABS Take 3 mg by mouth at bedtime. 10/20/18   [provider]  FLUoxetine (PROZAC) 20 MG capsule Take 20 mg by mouth daily. 08/17/18   [provider]  fluvoxaMINE (LUVOX) 100 MG tablet Take 100 mg by mouth 2 (two) times a day. 10/20/18   [provider]  haloperidol (HALDOL) 5 MG tablet  Take 2.5 mg by mouth daily. 10/20/18   [provider]  lamoTRIgine (LAMICTAL) 200 MG tablet Take 200 mg by mouth 2 (two) times a day. 10/20/18   [provider]  LORazepam (ATIVAN) 1 MG tablet Take 1-1.5 mg by mouth daily as needed for anxiety. 10/20/18   [provider]  metFORMIN (GLUCOPHAGE) 1000 MG tablet Take 1,000 mg by mouth 2 (two) times a day.    [provider]  metoCLOPramide (REGLAN) 10 MG tablet Take 1 tablet (10 mg total) by mouth 3 (three) times daily before meals. 04/09/21    Lattie Corns, PA-C  metoprolol succinate (TOPROL-XL) 50 MG 24 hr tablet Take 50 mg by mouth daily. Take with or immediately following a meal.    [provider]  NIFEdipine (PROCARDIA-XL/ADALAT CC) 30 MG 24 hr tablet Take 30 mg by mouth daily.    [provider]  omega-3 acid ethyl esters (LOVAZA) 1 g capsule Take 1 g by mouth daily.    [provider]  risperiDONE (RISPERDAL) 1 MG tablet Take 1 mg by mouth 2 (two) times a day. 10/20/18   [provider]  rosuvastatin (CRESTOR) 20 MG tablet Take 20 mg by mouth daily.    [provider]  telmisartan (MICARDIS) 20 MG tablet Take 20 mg by mouth daily.    [provider]   Physical Exam: Vitals:   05/20/21 1830 05/20/21 1845 05/20/21 1915 05/20/21 1930  BP: (!) 149/77 (!) 191/114 (!) 137/107 (!) 153/91  Pulse:   92   Resp:      Temp:      TempSrc:      SpO2:   100%   Weight:      Height:       Constitutional: appears age-appropriate, frail, NAD, calm, comfortable Eyes: PERRL, lids and conjunctivae normal ENMT: Mucous membranes are moist. Posterior pharynx clear of any exudate or lesions. Age-appropriate dentition. Hearing appropriate Neck: normal, supple, no masses, no thyromegaly Respiratory: clear to auscultation bilaterally, no wheezing, no crackles. Normal respiratory effort. No accessory muscle use.  Cardiovascular: Regular rate and rhythm, no murmurs / rubs / gallops. No extremity edema. 2+ pedal pulses. No carotid bruits.  Abdomen: no tenderness, no masses palpated, no hepatosplenomegaly. Bowel sounds positive.  Musculoskeletal: no clubbing / cyanosis. No joint deformity upper and lower extremities. Good ROM, no contractures, no atrophy. Normal muscle tone.  Skin: Diffuse elbow and knee lesions/scratches and wounds. No induration.  Ecchymosis in the right upper quadrant, right lower chest, right mid back Neurologic: Sensation intact. Strength 5/5 in all 4.  Psychiatric:  Normal judgment and insight. Alert and oriented x 3.  Flat affect.   EKG: independently reviewed, showing sinus rhythm with rate of 91, QTc 455  Chest x-ray on Admission: I personally reviewed and I agree with radiologist reading as below.  DG Chest 1 View  Result Date: 05/20/2021 CLINICAL DATA:  Fall, found down EXAM: CHEST  1 VIEW COMPARISON:  11/14/2018 FINDINGS: Mild cardiomegaly. Both lungs are clear. The visualized skeletal structures are unremarkable. IMPRESSION: Mild cardiomegaly without acute abnormality of the lungs in AP portable projection. Electronically Signed   By: Delanna Ahmadi M.D.   On: 05/20/2021 16:48   CT Head Wo Contrast  Result Date: 05/20/2021 CLINICAL DATA:  Head trauma, minor (Age >= 65y) EXAM: CT HEAD WITHOUT CONTRAST TECHNIQUE: Contiguous axial images were obtained from the base of the skull through the vertex without intravenous contrast. COMPARISON:  CT head 11/14/2018 FINDINGS: Brain:  There is no acute intracranial hemorrhage, mass effect, or edema. Gray-white differentiation is preserved. There is no extra-axial fluid collection. Prominence of the ventricles and sulci reflects similar parenchymal volume loss. Minimal patchy low density in the supratentorial white matter is nonspecific but may reflect minor chronic microvascular ischemic changes. Vascular: No hyperdense vessel or unexpected calcification. Skull: Calvarium is unremarkable. Sinuses/Orbits: Mild mucosal thickening.  Orbits are unremarkable. Other: Right periorbital soft tissue swelling. Mastoid air cells are clear. IMPRESSION: No evidence of acute intracranial injury. Electronically Signed   By: Macy Mis M.D.   On: 05/20/2021 16:32   CT Cervical Spine Wo Contrast  Result Date: 05/20/2021 CLINICAL DATA:  Neck trauma (Age >= 65y) EXAM: CT CERVICAL SPINE WITHOUT CONTRAST TECHNIQUE: Multidetector CT imaging of the cervical spine was performed without intravenous contrast. Multiplanar CT image  reconstructions were also generated. COMPARISON:  2020 FINDINGS: Alignment: Stable. Skull base and vertebrae: Stable vertebral body heights. No acute fracture. Soft tissues and spinal canal: No prevertebral fluid or swelling. No visible canal hematoma. Disc levels: Multilevel degenerative changes are present including disc space narrowing, endplate osteophytes, and facet and uncovertebral hypertrophy. Upper chest: No apical lung mass. A small nodule at the left lung apex was present in 2020 and is therefore benign. Other: Exophytic or adherent tissue within the right oropharynx (series 3, image 44) extending toward the piriformis sinus. IMPRESSION: No acute cervical spine fracture. Exophytic or adherent tissue within the right oropharynx. While a mass is difficult to exclude, this is favored to represent small volume retained ingested material. Electronically Signed   By: Macy Mis M.D.   On: 05/20/2021 16:41   CT ABDOMEN PELVIS W CONTRAST  Result Date: 05/20/2021 CLINICAL DATA:  Abdominal pain, nonlocalized EXAM: CT ABDOMEN AND PELVIS WITH CONTRAST TECHNIQUE: Multidetector CT imaging of the abdomen and pelvis was performed using the standard protocol following bolus administration of intravenous contrast. CONTRAST:  16m OMNIPAQUE IOHEXOL 300 MG/ML  SOLN COMPARISON:  None. FINDINGS: Lower chest: No focal pulmonary opacity or pleural effusion. No pericardial effusion. Hepatobiliary: Hepatic steatosis. No focal liver abnormality. The hepatic and portal veins are patent. No intra or extrahepatic biliary ductal dilatation. A Phrygian cap is noted on an otherwise normal gallbladder. No gallstones or gallbladder wall thickening. Pancreas: In the body of the pancreas, there is a 2.0 x 2.7 x 2.0 cm mass (series 2, image 20 and series 5, image 39), this central low density and mild adjacent stranding. Pancreas is otherwise unremarkable. Spleen: Normal in size without focal abnormality. Adrenals/Urinary Tract: The  adrenal glands are unremarkable. The kidneys enhance symmetrically with no hydronephrosis. Multiple low-attenuation lesions in the right greater than left kidney, which are too small to characterize but most likely renal cysts. The bladder contains a Foley. Apparent wall thickening is likely secondary to under distension. Stomach/Bowel: Stomach is within normal limits. Appendix appears normal. No evidence of bowel wall thickening, distention, or inflammatory changes. Diverticulosis without evidence of diverticulitis. Vascular/Lymphatic: Aortic atherosclerosis. No enlarged abdominal or pelvic lymph nodes. Reproductive: Prostatomegaly. Other: No abdominal wall hernia or abnormality. No abdominopelvic ascites. Musculoskeletal: Right subcapital femoral neck fracture (series 2, image 78), moderately impacted. Degenerative changes in the lumbar spine with approximately 40% height loss in the anterior aspect of L1, which appears chronic. Bilateral L5-S1 assimilation joints. IMPRESSION: 1. 2.7 cm mass in the body of the pancreas, with central low density, which may represent a neoplasm or focal collection. Adjacent surrounding suggest inflammatory process. This may represent neoplasm with superimposed acute  pancreatitis. Correlate with lipase. An MRI pancreatic protocol is recommended. 2. Right subcapital femoral neck fracture, moderately impacted, as seen on the same-day radiograph. 3. Chronic appearing compression deformity of L1, although no prior spine imaging is available. Correlate with point tenderness. If there is concern that this fracture may be acute, consider MRI. 4. Wall thickening in the bladder, which may be secondary to underdistension, given the presence of a Foley. These results were called by telephone at the time of interpretation on 05/20/2021 at 7:40 pm to provider Luisfelipe Engelstad , who verbally acknowledged these results. Electronically Signed   By: Merilyn Baba M.D.   On: 05/20/2021 19:44   DG Hip Unilat  W or Wo Pelvis 2-3 Views Right  Result Date: 05/20/2021 CLINICAL DATA:  Right hip pain after fall, abrasions EXAM: DG HIP (WITH OR WITHOUT PELVIS) 2-3V RIGHT COMPARISON:  None. FINDINGS: Frontal view of the pelvis as well as frontal and cross-table lateral views of the right hip are obtained. There is an impacted subcapital right femoral neck fracture. No evidence of dislocation. Left hip is unremarkable. Remainder of the bony pelvis is normal. IMPRESSION: 1. Impacted subcapital right femoral neck fracture. Electronically Signed   By: Randa Ngo M.D.   On: 05/20/2021 16:47    Labs on Admission: I have personally reviewed following labs  CBC: Recent Labs  Lab 05/20/21 1524  WBC 25.6*  NEUTROABS 23.0*  HGB 16.1  HCT 47.1  MCV 86.1  PLT 097   Basic Metabolic Panel: Recent Labs  Lab 05/20/21 1524  NA 138  K 4.3  CL 103  CO2 17*  GLUCOSE 185*  BUN 13  CREATININE 1.22  CALCIUM 9.6   GFR: Estimated Creatinine Clearance: 63.7 mL/min (by C-G formula based on SCr of 1.22 mg/dL).  Liver Function Tests: Recent Labs  Lab 05/20/21 1524  AST 158*  ALT 43  ALKPHOS 58  BILITOT 2.7*  PROT 6.8  ALBUMIN 3.8   Cardiac Enzymes: Recent Labs  Lab 05/20/21 1524  CKTOTAL 9,666*   Urine analysis:    Component Value Date/Time   COLORURINE YELLOW (A) 05/20/2021 1524   APPEARANCEUR CLEAR (A) 05/20/2021 1524   LABSPEC 1.009 05/20/2021 1524   PHURINE 6.0 05/20/2021 1524   GLUCOSEU 50 (A) 05/20/2021 1524   HGBUR LARGE (A) 05/20/2021 1524   BILIRUBINUR NEGATIVE 05/20/2021 1524   KETONESUR 20 (A) 05/20/2021 1524   PROTEINUR 100 (A) 05/20/2021 1524   NITRITE NEGATIVE 05/20/2021 1524   LEUKOCYTESUR NEGATIVE 05/20/2021 1524   CRITICAL CARE Performed by: Briant Cedar Theran Vandergrift  Total critical care time: 40 minutes  Critical care time was exclusive of separately billable procedures and treating other patients.  Critical care was necessary to treat or prevent imminent or life-threatening  deterioration.  Critical care was time spent personally by me on the following activities: development of treatment plan with patient and/or surrogate as well as nursing, discussions with consultants, evaluation of patient's response to treatment, examination of patient, obtaining history from patient or surrogate, ordering and performing treatments and interventions, ordering and review of laboratory studies, ordering and review of radiographic studies, pulse oximetry and re-evaluation of patient's condition.  Dr. Tobie Poet [facility]  If 7PM-7AM, please contact overnight-coverage provider If 7AM-7PM, please contact day coverage provider [email or web address]  05/20/2021, 7:49 PM

## 2021-05-20 NOTE — ED Notes (Signed)
Pt transitioned to a hospital bed with bed alarm; yellow fall risk band applied to the patients right wrist.

## 2021-05-20 NOTE — Consult Note (Signed)
ANTICOAGULATION CONSULT NOTE  Pharmacy Consult for IV Heparin Indication: chest pain/ACS  Patient Measurements: Height: 6\' 1"  (185.4 cm) Weight: 95.3 kg (210 lb) IBW/kg (Calculated) : 79.9 Heparin Dosing Weight: 95.3 kg  Labs: Recent Labs    05/20/21 1524 05/20/21 1739  HGB 16.1  --   HCT 47.1  --   PLT 200  --   CREATININE 1.22  --   CKTOTAL 9,666*  --   TROPONINIHS 738* 1,141*    Estimated Creatinine Clearance: 63.7 mL/min (by C-G formula based on SCr of 1.22 mg/dL).   Medical History: Past Medical History:  Diagnosis Date   Hypertension    Hypertriglyceridemia .    Medications:  No anticoagulation prior to admission per my chart review  Assessment: Patient is a 70 y/o M with medical history as above who was BIBEMS from aunt's house with falls and disorientation. Patient being admitted with sepsis. Labs significant for elevated troponin, CK, WBC, lactic acidosis. Pharmacy consulted to initiate and manage heparin infusion for suspected ACS.  Baseline CBC acceptable. Baseline aPTT and PT-INR are pending.   Goal of Therapy:  Heparin level 0.3-0.7 units/ml Monitor platelets by anticoagulation protocol: Yes   Plan:  --Heparin 4000 unit IV bolus followed by continuous infusion at 1200 units/hr --Heparin level 8 hours after initiation of infusion --Daily CBC per protocol while on IV heparin  Benita Gutter 05/20/2021,7:56 PM

## 2021-05-20 NOTE — ED Notes (Signed)
Pt had unwitnessed fall in his room.  Pt was found lying on the floor by this nurse and pa-c cutherill.  Pt placed on back board and placed on bed.  Dr Cherylann Banas in with pt too.  Approx 3000 cc urine emptied from foley bag.

## 2021-05-20 NOTE — Consult Note (Signed)
CODE SEPSIS - PHARMACY COMMUNICATION  **Broad Spectrum Antibiotics should be administered within 1 hour of Sepsis diagnosis**  Time Code Sepsis Called/Page Received: 1644  Antibiotics Ordered: Vancomycin, cefepime & metronidazole  Time of 1st antibiotic administration: 1701  Additional action taken by pharmacy: none  If necessary, Name of Provider/Nurse Contacted: n/a   Pedro Mccoy PharmD, BCPS 05/20/2021 4:52 PM

## 2021-05-20 NOTE — ED Notes (Signed)
Pt moved to room 15  report off to alycia rn   pt waiting on admission .

## 2021-05-20 NOTE — Consult Note (Signed)
PHARMACY -  BRIEF ANTIBIOTIC NOTE   Pharmacy has received consult(s) for Vancomycin and Cefepime from an ED provider.  The patient's profile has been reviewed for ht/wt/allergies/indication/available labs.    One time order(s) placed for Vancomycin 2000mg , and cefepime  Further antibiotics/pharmacy consults should be ordered by admitting physician if indicated.                       Thank you, Serafin Decatur Rodriguez-Guzman PharmD, BCPS 05/20/2021 4:50 PM

## 2021-05-20 NOTE — ED Notes (Signed)
Male purewick in place.  Iv meds infusing.

## 2021-05-20 NOTE — ED Triage Notes (Signed)
Pt brought in via ems from aunt's house.   Pt was found in the floor by ems.  Pt has abrasions to both elbows, urinary incontinence, bruise to left flank  hx schizo and bipolar.  Pt alert   iv in place.

## 2021-05-20 NOTE — ED Notes (Signed)
Troponin 738

## 2021-05-20 NOTE — ED Provider Notes (Signed)
[facility] Emergency Department Provider Note ____________________________________________   Event Date/Time   First MD Initiated Contact with Patient 05/20/21 1538     (approximate)  I have reviewed the triage vital signs and the nursing notes.   HISTORY  Chief Complaint Fall    HPI Pedro Mccoy is a 70 y.o. male with PMH as noted below including hypertension distention, hypertriglyceridemia, and schizophrenia who presents after a fall.  The patient states that he was not weak or dizzy, but the fall was "just something that happened."  He states he hit his head but did not lose consciousness.  He also reports right hip pain but denies any other injuries.  He states that he was feeling fine up until the point when he fell and denies any weakness or dizziness or other acute symptoms.  EMS noted abrasions to his elbows, bruising to left flank, and urinary incontinence.  Past Medical History:  Diagnosis Date   Hypertension    Hypertriglyceridemia .    Patient Active Problem List   Diagnosis Date Noted   Sepsis ([place]) 05/20/2021   Fall at home, initial encounter 05/20/2021   Altered mental state 09/10/2018   Schizophrenia [facility])     Past Surgical History:  Procedure Laterality Date   NO PAST SURGERIES      Prior to Admission medications   Medication Sig Start Date End Date Taking? Authorizing Provider  baclofen (LIORESAL) 10 MG tablet Take 1 tablet (10 mg total) by mouth 2 (two) times daily. 04/09/21   Lattie Corns, PA-C  benztropine (COGENTIN) 0.5 MG tablet Take 0.5 mg by mouth 2 (two) times a day. 10/20/18   [provider]  Eszopiclone 3 MG TABS Take 3 mg by mouth at bedtime. 10/20/18   [provider]  FLUoxetine (PROZAC) 20 MG capsule Take 20 mg by mouth daily. 08/17/18   [provider]  fluvoxaMINE (LUVOX) 100 MG tablet Take 100 mg by mouth 2 (two) times a day. 10/20/18   [provider]  haloperidol  (HALDOL) 5 MG tablet Take 2.5 mg by mouth daily. 10/20/18   [provider]  lamoTRIgine (LAMICTAL) 200 MG tablet Take 200 mg by mouth 2 (two) times a day. 10/20/18   [provider]  LORazepam (ATIVAN) 1 MG tablet Take 1-1.5 mg by mouth daily as needed for anxiety. 10/20/18   [provider]  metFORMIN (GLUCOPHAGE) 1000 MG tablet Take 1,000 mg by mouth 2 (two) times a day.    [provider]  metoCLOPramide (REGLAN) 10 MG tablet Take 1 tablet (10 mg total) by mouth 3 (three) times daily before meals. 04/09/21   Lattie Corns, PA-C  metoprolol succinate (TOPROL-XL) 50 MG 24 hr tablet Take 50 mg by mouth daily. Take with or immediately following a meal.    [provider]  NIFEdipine (PROCARDIA-XL/ADALAT CC) 30 MG 24 hr tablet Take 30 mg by mouth daily.    [provider]  omega-3 acid ethyl esters (LOVAZA) 1 g capsule Take 1 g by mouth daily.    [provider]  risperiDONE (RISPERDAL) 1 MG tablet Take 1 mg by mouth 2 (two) times a day. 10/20/18   [provider]  rosuvastatin (CRESTOR) 20 MG tablet Take 20 mg by mouth daily.    [provider]  telmisartan (MICARDIS) 20 MG tablet Take 20 mg by mouth daily.    [provider]    Allergies Pioglitazone, Ace inhibitors, Codeine, and Sitagliptin  Family  History  Problem Relation Age of Onset   COPD Mother    Heart attack Father     Social History Social History   Tobacco Use   Smoking status: Never   Smokeless tobacco: Never  Vaping Use   Vaping Use: Never used  Substance Use Topics   Alcohol use: No   Drug use: No    Review of Systems  Constitutional: No fever. Eyes: No redness. ENT: No sore throat. Cardiovascular: Denies chest pain. Respiratory: Denies shortness of breath. Gastrointestinal: No vomiting or diarrhea.  Genitourinary: Negative for dysuria.  Musculoskeletal: Negative for back pain.  Positive for right hip pain. Skin:  Negative for rash. Neurological: Negative for headaches, focal weakness or numbness.   ____________________________________________   PHYSICAL EXAM:  VITAL SIGNS: ED Triage Vitals  Enc Vitals Group     BP 05/20/21 1537 129/78     Pulse Rate 05/20/21 1537 92     Resp 05/20/21 1537 18     Temp 05/20/21 1537 98.6 F (37 C)     Temp Source 05/20/21 1537 Oral     SpO2 05/20/21 1537 97 %     Weight 05/20/21 1521 210 lb (95.3 kg)     Height 05/20/21 1521 6\' 1"  (1.854 m)     Head Circumference --      Peak Flow --      Pain Score 05/20/21 1520 9     Pain Loc --      Pain Edu? --      Excl. in [place]? --     Constitutional: Alert and oriented.  Weak appearing but in no acute distress. Eyes: Conjunctivae are normal.  EOMI.  PERRLA. Head: Atraumatic. Nose: No congestion/rhinnorhea. Mouth/Throat: Mucous membranes are dry.  Oropharynx clear.  Brownish dried material on tongue and lips. Neck: Normal range of motion.  Cardiovascular: Normal rate, regular rhythm. Grossly normal heart sounds.  Good peripheral circulation. Respiratory: Normal respiratory effort.  No retractions. Lungs CTAB. Gastrointestinal: Soft and nontender. No distention.  Genitourinary: No flank tenderness. Musculoskeletal: No lower extremity edema.  Extremities warm and well perfused.  Pain on range of motion of right hip.  No deformity or shortening. Neurologic:  Normal speech and language.  5/5 motor strength and intact sensation to all 4 extremities.  Diffuse tremor which the patient states is chronic.  No ataxia. Skin:  Skin is warm and dry. No rash noted. Psychiatric: Calm and cooperative.  ____________________________________________   LABS (all labs ordered are listed, but only abnormal results are displayed)  Labs Reviewed  COMPREHENSIVE METABOLIC PANEL - Abnormal; Notable for the following components:      Result Value   CO2 17 (*)    Glucose, Bld 185 (*)    AST 158 (*)    Total Bilirubin 2.7 (*)     Anion gap 18 (*)    All other components within normal limits  CBC WITH DIFFERENTIAL/PLATELET - Abnormal; Notable for the following components:   WBC 25.6 (*)    Neutro Abs 23.0 (*)    Lymphs Abs 0.5 (*)    Monocytes Absolute 1.7 (*)    Abs Immature Granulocytes 0.24 (*)    All other components within normal limits  CK - Abnormal; Notable for the following components:   Total CK 9,666 (*)    All other components within normal limits  LACTIC ACID, PLASMA - Abnormal; Notable for the following components:   Lactic Acid, Venous 5.0 (*)    All other components within normal  limits  URINALYSIS, ROUTINE W REFLEX MICROSCOPIC - Abnormal; Notable for the following components:   Color, Urine YELLOW (*)    APPearance CLEAR (*)    Glucose, UA 50 (*)    Hgb urine dipstick LARGE (*)    Ketones, ur 20 (*)    Protein, ur 100 (*)    Bacteria, UA RARE (*)    All other components within normal limits  TROPONIN I (HIGH SENSITIVITY) - Abnormal; Notable for the following components:   Troponin I (High Sensitivity) 738 (*)    All other components within normal limits  RESP PANEL BY RT-PCR (FLU A&B, COVID) ARPGX2  CULTURE, BLOOD (ROUTINE X 2)  CULTURE, BLOOD (ROUTINE X 2)  LACTIC ACID, PLASMA  HIV ANTIBODY (ROUTINE TESTING W REFLEX)  BASIC METABOLIC PANEL  MAGNESIUM  PHOSPHORUS  CBC WITH DIFFERENTIAL/PLATELET  PROTIME-INR  CORTISOL-AM, BLOOD  PROCALCITONIN  TROPONIN I (HIGH SENSITIVITY)   ____________________________________________  EKG  ED ECG REPORT I, Arta Silence, the attending physician, personally viewed and interpreted this ECG.  Date: 05/20/2021 EKG Time: 1602 Rate: 91 Rhythm: normal sinus rhythm QRS Axis: Left axis Intervals: normal ST/T Wave abnormalities: normal Narrative Interpretation: no evidence of acute ischemia  ____________________________________________  RADIOLOGY  Chest x-ray interpreted by me shows no focal consolidation or edema  XR R  hip/pelvis: IMPRESSION:  1. Impacted subcapital right femoral neck fracture.   CT head/cervical spine:  IMPRESSION:  No evidence of acute intracranial injury.   IMPRESSION:  No acute cervical spine fracture.     Exophytic or adherent tissue within the right oropharynx. While a  mass is difficult to exclude, this is favored to represent small  volume retained ingested material.   CT abdomen/pelvis: Pending  ____________________________________________   PROCEDURES  Procedure(s) performed: No  Procedures  Critical Care performed: Yes  CRITICAL CARE Performed by: Arta Silence   Total critical care time: 40 minutes  Critical care time was exclusive of separately billable procedures and treating other patients.  Critical care was necessary to treat or prevent imminent or life-threatening deterioration.  Critical care was time spent personally by me on the following activities: development of treatment plan with patient and/or surrogate as well as nursing, discussions with consultants, evaluation of patient's response to treatment, examination of patient, obtaining history from patient or surrogate, ordering and performing treatments and interventions, ordering and review of laboratory studies, ordering and review of radiographic studies, pulse oximetry and re-evaluation of patient's condition.  ____________________________________________   INITIAL IMPRESSION / ASSESSMENT AND PLAN / ED COURSE  Pertinent labs & imaging results that were available during my care of the patient were reviewed by me and considered in my medical decision making (see chart for details).   70 year old male with PMH as noted above including hypertension and schizophrenia presents ostensibly after a fall which he cannot characterize his mechanical or otherwise, although he appears very generally weak and had urinary incontinence per EMS.  On exam the vital signs are normal and neurologic exam  is nonfocal.  The patient is alert and oriented but very weak and tremulous appearing.  He has dried brown material around his mouth which she states is denture cream.  Review of systems is negative other than right hip pain although the patient has a somewhat flat affect and does not seem to be describing his symptoms in much detail.  Although mechanical fall is possible, I have a strong suspicion for underlying acute medical issue; differential is broad but includes dehydration, AKI, other metabolic  disturbance, acute infection/sepsis, or less likely cardiac or CNS cause.  We will obtain CT head and cervical spine due to the acute trauma, chest and hip x-rays, and lab work-up to evaluate for other etiologies of his fall and weakness.  ----------------------------------------- 6:01 PM on 05/20/2021 -----------------------------------------  Imaging reveals a right femoral subcapital fracture.  Lab work-up is significant for elevated CK consistent with rhabdomyolysis, elevated lactate, and leukocytosis.  These findings are concerning for sepsis although chest x-ray and urinalysis do not provide a clear source.  The patient does not have any headache and does not appear significantly altered, so I do not suspect meningitis/encephalitis.  I have ordered empiric IV antibiotics per the sepsis protocol as well as fluids.  The lab work-up also reveals an elevated troponin which is consistent with demand ischemia; the patient has no significant EKG findings.  The patient has had some loose stools in the ED.  He does not complain of abdominal pain, however given the lack of other infectious source I have ordered a CT abdomen/pelvis to evaluate for intra-abdominal etiology.  Given these multiple issues, the patient will require admission; I consulted Dr. Rudene Christians from orthopedics to evaluate the hip fracture for repair, and I consulted Dr. Tobie Poet from the hospitalist service for  admission.  ____________________________________________   FINAL CLINICAL IMPRESSION(S) / ED DIAGNOSES  Final diagnoses:  Traumatic rhabdomyolysis, initial encounter ([place])  Closed fracture of right hip, initial encounter ([place])  Sepsis, due to unspecified organism, unspecified whether acute organ dysfunction present [facility])      NEW MEDICATIONS STARTED DURING THIS VISIT:  New Prescriptions   No medications on file     Note:  This document was prepared using Dragon voice recognition software and may include unintentional dictation errors.    Arta Silence, MD 05/20/21 1806

## 2021-05-20 NOTE — Progress Notes (Signed)
Pharmacy Antibiotic Note  Chadley Dziedzic is a 70 y.o. male admitted on 05/20/2021. Pharmacy has been consulted for vancomycin and cefepime dosing.  Plan: Vancomycin 2 g IV given in the ED. Follow with vancomycin 1750 mg IV q24h.  Goal AUC 400-550 Expected AUC: 445 SCr used: 1.22  Cefepime 2 g IV q8h   Height: 6\' 1"  (185.4 cm) Weight: 95.3 kg (210 lb) IBW/kg (Calculated) : 79.9  Temp (24hrs), Avg:98.6 F (37 C), Min:98.6 F (37 C), Max:98.6 F (37 C)  Recent Labs  Lab 05/20/21 1524  WBC 25.6*  CREATININE 1.22  LATICACIDVEN 5.0*    Estimated Creatinine Clearance: 63.7 mL/min (by C-G formula based on SCr of 1.22 mg/dL).    Allergies  Allergen Reactions   Pioglitazone Nausea Only   Ace Inhibitors    Codeine    Sitagliptin Nausea And Vomiting    Antimicrobials this admission: Cefepime 11/21 >> Metronidazole 11/21 >> Vancomycin 11/21 >>   Microbiology results: 11/21 BCx: pending   Thank you for allowing pharmacy to be a part of this patient's care.  Tawnya Crook, PharmD, BCPS Clinical Pharmacist 05/20/2021 6:12 PM

## 2021-05-20 NOTE — ED Notes (Signed)
Pt brought in via ems from home.  Pt has recent falls and multiple bruises all over body.  Pt has dried blood on lips and in mouth.  Pt doesn't know what happened to him.  Pt alert on arrival and oriented.  Iv in place.  Pt had stool all over him.  Pt cleaned up.  Urine and labs sent.   Pt has bruising to right flank, face and right leg.  Pt denies headache or neck pain.  No back pain.  Siderails up x 2.

## 2021-05-20 NOTE — ED Notes (Signed)
Pt stooled again.  Pt cleaned up.  Pt tolerated well.

## 2021-05-20 NOTE — Sepsis Progress Note (Signed)
Sepsis protocol is being followed by [facility]. 

## 2021-05-20 NOTE — ED Notes (Signed)
Pt had another bowel movement.  Pt cleaned up.  Tolerated well.  Iv fluids infusing.  Pt alert.

## 2021-05-20 NOTE — ED Notes (Addendum)
Lactic acid 5.0-msg sent to Dr. Cherylann Banas.

## 2021-05-20 NOTE — ED Notes (Signed)
16 fr foley cath inserted without diff.   Pt tolerated well.  Iv meds infusing.  Pt waiting on admission.  Pt alert.

## 2021-05-20 NOTE — ED Notes (Signed)
Iv meds infusing  pt alert.

## 2021-05-21 ENCOUNTER — Inpatient Hospital Stay: Payer: Medicare Other | Admitting: Anesthesiology

## 2021-05-21 ENCOUNTER — Encounter: Payer: Self-pay | Admitting: Internal Medicine

## 2021-05-21 ENCOUNTER — Encounter
Admission: EM | Disposition: A | Discharge status: Home Health | Payer: Self-pay | Source: Home / Self Care | Attending: Internal Medicine

## 2021-05-21 ENCOUNTER — Inpatient Hospital Stay: Payer: Medicare Other

## 2021-05-21 ENCOUNTER — Other Ambulatory Visit: Payer: Self-pay

## 2021-05-21 ENCOUNTER — Inpatient Hospital Stay (HOSPITAL_COMMUNITY)
Admit: 2021-05-21 | Discharge: 2021-05-21 | Disposition: A | Discharge status: Home / Self Care | Payer: Medicare Other | Attending: Internal Medicine | Admitting: Internal Medicine

## 2021-05-21 DIAGNOSIS — I214 Non-ST elevation (NSTEMI) myocardial infarction: Secondary | ICD-10-CM | POA: Diagnosis not present

## 2021-05-21 HISTORY — PX: TOTAL HIP ARTHROPLASTY: SHX124

## 2021-05-21 LAB — BASIC METABOLIC PANEL
Anion gap: 8 (ref 5–15)
BUN: 16 mg/dL (ref 8–23)
CO2: 20 mmol/L — ABNORMAL LOW (ref 22–32)
Calcium: 8.4 mg/dL — ABNORMAL LOW (ref 8.9–10.3)
Chloride: 108 mmol/L (ref 98–111)
Creatinine, Ser: 0.87 mg/dL (ref 0.61–1.24)
GFR, Estimated: >60 mL/min (ref >60)
Glucose, Bld: 137 mg/dL — ABNORMAL HIGH (ref 70–99)
Potassium: 3.7 mmol/L (ref 3.5–5.1)
Sodium: 136 mmol/L (ref 135–145)

## 2021-05-21 LAB — BLOOD CULTURE ID PANEL (REFLEXED) - BCID2

## 2021-05-21 LAB — CK: Total CK: 6770 U/L — ABNORMAL HIGH (ref 49–397)

## 2021-05-21 LAB — PROTIME-INR
INR: 1.4 — ABNORMAL HIGH (ref 0.8–1.2)
INR: 1.5 — ABNORMAL HIGH (ref 0.8–1.2)
Prothrombin Time: 16.7 seconds — ABNORMAL HIGH (ref 11.4–15.2)
Prothrombin Time: 17.6 seconds — ABNORMAL HIGH (ref 11.4–15.2)

## 2021-05-21 LAB — TYPE AND SCREEN
ABO/RH(D): O POS
Antibody Screen: NEGATIVE

## 2021-05-21 LAB — CBG MONITORING, ED
Glucose-Capillary: 135 mg/dL — ABNORMAL HIGH (ref 70–99)
Glucose-Capillary: 178 mg/dL — ABNORMAL HIGH (ref 70–99)

## 2021-05-21 LAB — CBC WITH DIFFERENTIAL/PLATELET
Abs Immature Granulocytes: 0.12 10*3/uL — ABNORMAL HIGH (ref 0.00–0.07)
Basophils Absolute: 0 10*3/uL (ref 0.0–0.1)
Basophils Relative: 0 %
Eosinophils Absolute: 0 10*3/uL (ref 0.0–0.5)
Eosinophils Relative: 0 %
HCT: 41.7 % (ref 39.0–52.0)
Hemoglobin: 14.4 g/dL (ref 13.0–17.0)
Immature Granulocytes: 1 %
Lymphocytes Relative: 4 %
Lymphs Abs: 0.8 10*3/uL (ref 0.7–4.0)
MCH: 29.5 pg (ref 26.0–34.0)
MCHC: 34.5 g/dL (ref 30.0–36.0)
MCV: 85.5 fL (ref 80.0–100.0)
Monocytes Absolute: 1 10*3/uL (ref 0.1–1.0)
Monocytes Relative: 5 %
Neutro Abs: 17.3 10*3/uL — ABNORMAL HIGH (ref 1.7–7.7)
Neutrophils Relative %: 90 %
Platelets: 171 10*3/uL (ref 150–400)
RBC: 4.88 MIL/uL (ref 4.22–5.81)
RDW: 13.5 % (ref 11.5–15.5)
WBC: 19.3 10*3/uL — ABNORMAL HIGH (ref 4.0–10.5)
nRBC: 0 % (ref 0.0–0.2)

## 2021-05-21 LAB — HIV ANTIBODY (ROUTINE TESTING W REFLEX): HIV Screen 4th Generation wRfx: NONREACTIVE

## 2021-05-21 LAB — ECHOCARDIOGRAM COMPLETE
AR max vel: 3.61 cm2
AV Area VTI: 4.09 cm2
AV Area mean vel: 3.85 cm2
AV Mean grad: 6 mmHg
AV Peak grad: 11.3 mmHg
Ao pk vel: 1.68 m/s
Area-P 1/2: 3.19 cm2
Height: 73 in
MV VTI: 4.18 cm2
S' Lateral: 2.96 cm
Weight: 3360 oz

## 2021-05-21 LAB — APTT: aPTT: 85 seconds — ABNORMAL HIGH (ref 24–36)

## 2021-05-21 LAB — PROCALCITONIN: Procalcitonin: 2.79 ng/mL

## 2021-05-21 LAB — PHOSPHORUS: Phosphorus: 3 mg/dL (ref 2.5–4.6)

## 2021-05-21 LAB — ABO/RH: ABO/RH(D): O POS

## 2021-05-21 LAB — MAGNESIUM: Magnesium: 1.7 mg/dL (ref 1.7–2.4)

## 2021-05-21 LAB — HEPARIN LEVEL (UNFRACTIONATED): Heparin Unfractionated: 0.12 IU/mL — ABNORMAL LOW (ref 0.30–0.70)

## 2021-05-21 LAB — TROPONIN I (HIGH SENSITIVITY): Troponin I (High Sensitivity): 1163 ng/L (ref <18)

## 2021-05-21 LAB — CORTISOL-AM, BLOOD: Cortisol - AM: 31.6 ug/dL — ABNORMAL HIGH (ref 6.7–22.6)

## 2021-05-21 LAB — LACTIC ACID, PLASMA: Lactic Acid, Venous: 1.5 mmol/L (ref 0.5–1.9)

## 2021-05-21 SURGERY — ARTHROPLASTY, HIP, TOTAL, ANTERIOR APPROACH
Anesthesia: General | Site: Hip | Laterality: Right

## 2021-05-21 MED ORDER — FENTANYL CITRATE (PF) 100 MCG/2ML IJ SOLN: 25.0000 ug | INTRAMUSCULAR | Status: DC | PRN

## 2021-05-21 MED ORDER — 0.9 % SODIUM CHLORIDE (POUR BTL) OPTIME
TOPICAL | Status: DC | PRN
  Administered 2021-05-21: 1000 mL

## 2021-05-21 MED ORDER — CEFAZOLIN SODIUM-DEXTROSE 2-4 GM/100ML-% IV SOLN
2.0000 g | Freq: Four times a day (QID) | INTRAVENOUS | Status: AC
  Administered 2021-05-21: 2 g via INTRAVENOUS
  Filled 2021-05-21: qty 100

## 2021-05-21 MED ORDER — ONDANSETRON HCL 4 MG/2ML IJ SOLN
INTRAMUSCULAR | Status: DC | PRN
  Administered 2021-05-21: 4 mg via INTRAVENOUS

## 2021-05-21 MED ORDER — HEPARIN BOLUS VIA INFUSION
2800.0000 [IU] | Freq: Once | INTRAVENOUS | Status: AC
  Administered 2021-05-21: 2800 [IU] via INTRAVENOUS
  Filled 2021-05-21: qty 2800

## 2021-05-21 MED ORDER — DEXAMETHASONE SODIUM PHOSPHATE 10 MG/ML IJ SOLN
INTRAMUSCULAR | Status: DC | PRN
  Administered 2021-05-21: 5 mg via INTRAVENOUS

## 2021-05-21 MED ORDER — PHENOL 1.4 % MT LIQD
1.0000 | OROMUCOSAL | Status: DC | PRN
  Filled 2021-05-21: qty 177

## 2021-05-21 MED ORDER — ONDANSETRON HCL 4 MG PO TABS: 4.0000 mg | ORAL_TABLET | Freq: Four times a day (QID) | ORAL | Status: DC | PRN

## 2021-05-21 MED ORDER — VASOPRESSIN 20 UNIT/ML IV SOLN
INTRAVENOUS | Status: DC | PRN
  Administered 2021-05-21 (×3): 1 [IU] via INTRAVENOUS

## 2021-05-21 MED ORDER — SODIUM CHLORIDE (PF) 0.9 % IJ SOLN
INTRAMUSCULAR | Status: DC | PRN
  Administered 2021-05-21: 90 mL via INTRAMUSCULAR

## 2021-05-21 MED ORDER — NEOMYCIN-POLYMYXIN B GU 40-200000 IR SOLN
Status: AC
  Filled 2021-05-21: qty 4

## 2021-05-21 MED ORDER — METOCLOPRAMIDE HCL 10 MG PO TABS: 5.0000 mg | ORAL_TABLET | Freq: Three times a day (TID) | ORAL | Status: DC | PRN

## 2021-05-21 MED ORDER — SODIUM CHLORIDE FLUSH 0.9 % IV SOLN
INTRAVENOUS | Status: AC
  Filled 2021-05-21: qty 10

## 2021-05-21 MED ORDER — SODIUM CHLORIDE 0.9 % IV SOLN: INTRAVENOUS | Status: DC

## 2021-05-21 MED ORDER — LIDOCAINE HCL (CARDIAC) PF 100 MG/5ML IV SOSY
PREFILLED_SYRINGE | INTRAVENOUS | Status: DC | PRN
  Administered 2021-05-21: 100 mg via INTRAVENOUS

## 2021-05-21 MED ORDER — METOCLOPRAMIDE HCL 5 MG/ML IJ SOLN: 5.0000 mg | Freq: Three times a day (TID) | INTRAMUSCULAR | Status: DC | PRN

## 2021-05-21 MED ORDER — PROPOFOL 10 MG/ML IV BOLUS
INTRAVENOUS | Status: AC
  Filled 2021-05-21: qty 20

## 2021-05-21 MED ORDER — MAGNESIUM HYDROXIDE 400 MG/5ML PO SUSP
30.0000 mL | Freq: Every day | ORAL | Status: DC
  Administered 2021-05-21 – 2021-05-22 (×2): 30 mL via ORAL
  Filled 2021-05-21 (×2): qty 30

## 2021-05-21 MED ORDER — BUPIVACAINE LIPOSOME 1.3 % IJ SUSP
INTRAMUSCULAR | Status: AC
  Filled 2021-05-21: qty 20

## 2021-05-21 MED ORDER — FENTANYL CITRATE (PF) 100 MCG/2ML IJ SOLN
INTRAMUSCULAR | Status: DC | PRN
  Administered 2021-05-21: 50 ug via INTRAVENOUS

## 2021-05-21 MED ORDER — NEOMYCIN-POLYMYXIN B GU 40-200000 IR SOLN
Status: DC | PRN
  Administered 2021-05-21: 4 mL

## 2021-05-21 MED ORDER — DEXAMETHASONE SODIUM PHOSPHATE 10 MG/ML IJ SOLN
INTRAMUSCULAR | Status: AC
  Filled 2021-05-21: qty 1

## 2021-05-21 MED ORDER — HEPARIN (PORCINE) 25000 UT/250ML-% IV SOLN
1850.0000 [IU]/h | INTRAVENOUS | Status: DC
  Administered 2021-05-22: 1550 [IU]/h via INTRAVENOUS
  Filled 2021-05-21: qty 250

## 2021-05-21 MED ORDER — BUPIVACAINE-EPINEPHRINE (PF) 0.25% -1:200000 IJ SOLN
INTRAMUSCULAR | Status: AC
  Filled 2021-05-21: qty 30

## 2021-05-21 MED ORDER — PROPOFOL 10 MG/ML IV BOLUS
INTRAVENOUS | Status: DC | PRN
  Administered 2021-05-21: 150 mg via INTRAVENOUS

## 2021-05-21 MED ORDER — ONDANSETRON HCL 4 MG/2ML IJ SOLN
INTRAMUSCULAR | Status: AC
  Filled 2021-05-21: qty 2

## 2021-05-21 MED ORDER — OXYCODONE-ACETAMINOPHEN 5-325 MG PO TABS: 1.0000 | ORAL_TABLET | Freq: Four times a day (QID) | ORAL | Status: AC | PRN

## 2021-05-21 MED ORDER — PHENYLEPHRINE HCL (PRESSORS) 10 MG/ML IV SOLN
INTRAVENOUS | Status: AC
  Filled 2021-05-21: qty 1

## 2021-05-21 MED ORDER — SODIUM CHLORIDE FLUSH 0.9 % IV SOLN
INTRAVENOUS | Status: AC
  Filled 2021-05-21: qty 40

## 2021-05-21 MED ORDER — MENTHOL 3 MG MT LOZG
1.0000 | LOZENGE | OROMUCOSAL | Status: DC | PRN
  Filled 2021-05-21: qty 9

## 2021-05-21 MED ORDER — VASOPRESSIN 20 UNIT/ML IV SOLN
INTRAVENOUS | Status: AC
  Filled 2021-05-21: qty 1

## 2021-05-21 MED ORDER — BISACODYL 10 MG RE SUPP
10.0000 mg | Freq: Every day | RECTAL | Status: DC | PRN
  Filled 2021-05-21: qty 1

## 2021-05-21 MED ORDER — PERFLUTREN LIPID MICROSPHERE
1.0000 mL | INTRAVENOUS | Status: AC | PRN
  Administered 2021-05-21: 2 mL via INTRAVENOUS
  Filled 2021-05-21: qty 10

## 2021-05-21 MED ORDER — HEMOSTATIC AGENTS (NO CHARGE) OPTIME
TOPICAL | Status: DC | PRN
  Administered 2021-05-21: 2 via TOPICAL

## 2021-05-21 MED ORDER — CEFAZOLIN SODIUM-DEXTROSE 2-3 GM-%(50ML) IV SOLR
2.0000 g | INTRAVENOUS | Status: AC
  Administered 2021-05-21: 2 g via INTRAVENOUS
  Filled 2021-05-21: qty 50

## 2021-05-21 MED ORDER — SUGAMMADEX SODIUM 200 MG/2ML IV SOLN
INTRAVENOUS | Status: DC | PRN
Start: 2021-05-21 — End: 2021-05-21
  Administered 2021-05-21: 200 mg via INTRAVENOUS

## 2021-05-21 MED ORDER — CHLORHEXIDINE GLUCONATE 0.12 % MT SOLN
OROMUCOSAL | Status: AC
  Filled 2021-05-21: qty 15

## 2021-05-21 MED ORDER — VANCOMYCIN HCL 1250 MG/250ML IV SOLN
1250.0000 mg | Freq: Two times a day (BID) | INTRAVENOUS | Status: DC
  Administered 2021-05-21 – 2021-05-22 (×4): 1250 mg via INTRAVENOUS
  Filled 2021-05-21 (×5): qty 250

## 2021-05-21 MED ORDER — DIPHENHYDRAMINE HCL 12.5 MG/5ML PO ELIX
12.5000 mg | ORAL_SOLUTION | ORAL | Status: DC | PRN
  Filled 2021-05-21: qty 10

## 2021-05-21 MED ORDER — DOCUSATE SODIUM 100 MG PO CAPS
100.0000 mg | ORAL_CAPSULE | Freq: Two times a day (BID) | ORAL | Status: DC
  Administered 2021-05-21 – 2021-05-23 (×4): 100 mg via ORAL
  Filled 2021-05-21 (×5): qty 1

## 2021-05-21 MED ORDER — ROCURONIUM BROMIDE 100 MG/10ML IV SOLN
INTRAVENOUS | Status: DC | PRN
  Administered 2021-05-21: 10 mg via INTRAVENOUS
  Administered 2021-05-21: 50 mg via INTRAVENOUS

## 2021-05-21 MED ORDER — PHENYLEPHRINE HCL (PRESSORS) 10 MG/ML IV SOLN
INTRAVENOUS | Status: DC | PRN
  Administered 2021-05-21 (×4): 160 ug via INTRAVENOUS
  Administered 2021-05-21: 240 ug via INTRAVENOUS
  Administered 2021-05-21 (×3): 80 ug via INTRAVENOUS

## 2021-05-21 MED ORDER — CHLORHEXIDINE GLUCONATE 0.12 % MT SOLN
15.0000 mL | Freq: Once | OROMUCOSAL | Status: AC
  Administered 2021-05-21: 15 mL via OROMUCOSAL

## 2021-05-21 MED ORDER — ALUM & MAG HYDROXIDE-SIMETH 200-200-20 MG/5ML PO SUSP: 30.0000 mL | ORAL | Status: DC | PRN

## 2021-05-21 MED ORDER — ONDANSETRON HCL 4 MG/2ML IJ SOLN: 4.0000 mg | Freq: Four times a day (QID) | INTRAMUSCULAR | Status: DC | PRN

## 2021-05-21 MED ORDER — FENTANYL CITRATE (PF) 100 MCG/2ML IJ SOLN
INTRAMUSCULAR | Status: AC
  Filled 2021-05-21: qty 2

## 2021-05-21 SURGICAL SUPPLY — 61 items
BLADE SAGITTAL AGGR TOOTH XLG (BLADE) ×2 IMPLANT
BNDG COHESIVE 6X5 TAN ST LF (GAUZE/BANDAGES/DRESSINGS) ×6 IMPLANT
CANISTER WOUND CARE 500ML ATS (WOUND CARE) ×2 IMPLANT
CHLORAPREP W/TINT 26 (MISCELLANEOUS) ×2 IMPLANT
COVER BACK TABLE REUSABLE LG (DRAPES) ×2 IMPLANT
DRAPE 3/4 80X56 (DRAPES) ×6 IMPLANT
DRAPE C-ARM XRAY 36X54 (DRAPES) ×2 IMPLANT
DRAPE INCISE IOBAN 66X60 STRL (DRAPES) IMPLANT
DRAPE POUCH INSTRU U-SHP 10X18 (DRAPES) ×2 IMPLANT
DRESSING SURGICEL FIBRLLR 1X2 (HEMOSTASIS) ×2 IMPLANT
DRSG MEPILEX SACRM 8.7X9.8 (GAUZE/BANDAGES/DRESSINGS) IMPLANT
DRSG OPSITE POSTOP 4X8 (GAUZE/BANDAGES/DRESSINGS) IMPLANT
DRSG SURGICEL FIBRILLAR 1X2 (HEMOSTASIS) ×4
ELECT BLADE 6.5 EXT (BLADE) ×2 IMPLANT
ELECT REM PT RETURN 9FT ADLT (ELECTROSURGICAL) ×2
ELECTRODE REM PT RTRN 9FT ADLT (ELECTROSURGICAL) ×1 IMPLANT
GAUZE 4X4 16PLY ~~LOC~~+RFID DBL (SPONGE) ×2 IMPLANT
GLOVE SURG SYN 9.0  PF PI (GLOVE) ×2
GLOVE SURG SYN 9.0 PF PI (GLOVE) ×2 IMPLANT
GLOVE SURG UNDER POLY LF SZ9 (GLOVE) ×2 IMPLANT
GOWN SRG 2XL LVL 4 RGLN SLV (GOWNS) ×1 IMPLANT
GOWN STRL NON-REIN 2XL LVL4 (GOWNS) ×1
GOWN STRL REUS W/ TWL LRG LVL3 (GOWN DISPOSABLE) ×1 IMPLANT
GOWN STRL REUS W/TWL LRG LVL3 (GOWN DISPOSABLE) ×1
HEMOVAC 400CC 10FR (MISCELLANEOUS) IMPLANT
HIP FEM HD M 28 (Head) ×2 IMPLANT
HOLDER FOLEY CATH W/STRAP (MISCELLANEOUS) ×2 IMPLANT
KIT PREVENA INCISION MGT 13 (CANNISTER) ×2 IMPLANT
LINER DMM HIGHCR 28MM (Liner) ×2 IMPLANT
MANIFOLD NEPTUNE II (INSTRUMENTS) ×2 IMPLANT
MAT ABSORB  FLUID 56X50 GRAY (MISCELLANEOUS) ×1
MAT ABSORB FLUID 56X50 GRAY (MISCELLANEOUS) ×1 IMPLANT
NDL SAFETY ECLIPSE 18X1.5 (NEEDLE) ×1 IMPLANT
NEEDLE HYPO 18GX1.5 SHARP (NEEDLE) ×1
NEEDLE SPNL 20GX3.5 QUINCKE YW (NEEDLE) ×4 IMPLANT
NS IRRIG 1000ML POUR BTL (IV SOLUTION) ×2 IMPLANT
PACK HIP COMPR (MISCELLANEOUS) ×2 IMPLANT
SCALPEL PROTECTED #10 DISP (BLADE) ×4 IMPLANT
SHELL ACETABULAR DM  62MM (Shell) ×2 IMPLANT
SOL PREP PVP 2OZ (MISCELLANEOUS)
SOLUTION PREP PVP 2OZ (MISCELLANEOUS) IMPLANT
SOLUTION PRONTOSAN WOUND 350ML (IRRIGATION / IRRIGATOR) IMPLANT
SPONGE DRAIN TRACH 4X4 STRL 2S (GAUZE/BANDAGES/DRESSINGS) ×2 IMPLANT
SPONGE T-LAP 18X18 ~~LOC~~+RFID (SPONGE) ×4 IMPLANT
STAPLER SKIN PROX 35W (STAPLE) ×2 IMPLANT
STEM FEM SZ6 STD COLLARED (Stem) ×2 IMPLANT
STRAP SAFETY 5IN WIDE (MISCELLANEOUS) ×2 IMPLANT
SUT DVC 2 QUILL PDO  T11 36X36 (SUTURE) ×1
SUT DVC 2 QUILL PDO T11 36X36 (SUTURE) ×1 IMPLANT
SUT SILK 0 (SUTURE) ×1
SUT SILK 0 30XBRD TIE 6 (SUTURE) ×1 IMPLANT
SUT V-LOC 90 ABS DVC 3-0 CL (SUTURE) ×2 IMPLANT
SUT VIC AB 1 CT1 36 (SUTURE) ×2 IMPLANT
SYR 20ML LL LF (SYRINGE) ×2 IMPLANT
SYR 30ML LL (SYRINGE) ×2 IMPLANT
SYR 50ML LL SCALE MARK (SYRINGE) ×4 IMPLANT
SYR BULB IRRIG 60ML STRL (SYRINGE) ×2 IMPLANT
TAPE MICROFOAM 4IN (TAPE) IMPLANT
TOWEL OR 17X26 4PK STRL BLUE (TOWEL DISPOSABLE) IMPLANT
TRAY FOLEY MTR SLVR 16FR STAT (SET/KITS/TRAYS/PACK) IMPLANT
WATER STERILE IRR 500ML POUR (IV SOLUTION) ×2 IMPLANT

## 2021-05-21 NOTE — Transfer of Care (Signed)
Immediate Anesthesia Transfer of Care Note  Patient: Pedro Mccoy  Procedure(s) Performed: ANTERIOR HIP TOTAL ARTHROPLASTY (Right: Hip)  Patient Location: PACU  Anesthesia Type:General  Level of Consciousness: drowsy  Airway & Oxygen Therapy: Patient Spontanous Breathing and Patient connected to face mask oxygen  Post-op Assessment: Report given to RN and Post -op Vital signs reviewed and stable  Post vital signs: Reviewed and stable  Last Vitals:  Vitals Value Taken Time  BP 138/69 05/21/21 1419  Temp    Pulse 69 05/21/21 1423  Resp 12 05/21/21 1423  SpO2 99 % 05/21/21 1423  Vitals shown include unvalidated device data.  Last Pain:  Vitals:   05/21/21 1122  TempSrc: Oral  PainSc: 9          Complications: No notable events documented.

## 2021-05-21 NOTE — Progress Notes (Signed)
*  PRELIMINARY RESULTS* Echocardiogram 2D Echocardiogram has been performed.  Pedro Mccoy [name] 05/21/2021, 10:26 AM

## 2021-05-21 NOTE — Consult Note (Signed)
ANTICOAGULATION CONSULT NOTE  Pharmacy Consult for IV Heparin Indication: chest pain/ACS  Patient Measurements: Height: 6\' 1"  (185.4 cm) Weight: 95.3 kg (210 lb) IBW/kg (Calculated) : 79.9 Heparin Dosing Weight: 95.3 kg  Labs: Recent Labs    05/20/21 1524 05/20/21 1739 05/20/21 2300 05/20/21 2355 05/21/21 0431  HGB 16.1  --   --   --  14.4  HCT 47.1  --   --   --  41.7  PLT 200  --   --   --  171  APTT  --   --   --  85*  --   LABPROT  --  16.7*  --   --  17.6*  INR  --  1.4*  --   --  1.5*  HEPARINUNFRC  --   --   --   --  0.12*  CREATININE 1.22  --   --   --  0.87  CKTOTAL 9,666*  --   --   --  6,770*  TROPONINIHS 738* 1,141* 1,163*  --   --      Estimated Creatinine Clearance: 89.3 mL/min (by C-G formula based on SCr of 0.87 mg/dL).   Medical History: Past Medical History:  Diagnosis Date   Hypertension    Hypertriglyceridemia .    Medications:  No anticoagulation prior to admission per my chart review  Assessment: Patient is a 70 y/o M with medical history as above who was BIBEMS from aunt's house with falls and disorientation. Patient being admitted with sepsis. Labs significant for elevated troponin, CK, WBC, lactic acidosis. Pharmacy consulted to initiate and manage heparin infusion for suspected ACS.  Baseline CBC acceptable. Baseline aPTT and PT-INR are pending.   Goal of Therapy:  Heparin level 0.3-0.7 units/ml Monitor platelets by anticoagulation protocol: Yes   Plan:  11/22:  HL @ 0431 = 0.12, subtherapeutic  Will order heparin 2800 units IV X 1 and increase drip rate to  1550 units/hr.  Will recheck HL 6 hrs after rate change.   Lucciana Head D 05/21/2021,7:00 AM

## 2021-05-21 NOTE — ED Notes (Signed)
Dr. Bridgett Larsson notified of the patients increased anxiety - View [facility] for intervention.

## 2021-05-21 NOTE — ED Notes (Signed)
Per preop, MD requests stopping Heparin.

## 2021-05-21 NOTE — Consult Note (Signed)
Reason for Consult: Right hip fracture Referring Physician: Dr. Leanord Hawking is an 70 y.o. male.  HPI: Patient is poor historian.  He has multiple chronic medical problems as well as schizophrenia.  He was at his aunts house and suffered a fall he was unable to get up and subsequently ended up in the emergency room with femoral neck fracture.  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    Past Surgical History:  Procedure Laterality Date   NO PAST SURGERIES      Family History  Problem Relation Age of Onset   COPD Mother    Heart attack Father     Social History:  reports that he has never smoked. He has never used smokeless tobacco. He reports that he does not drink alcohol and does not use drugs.  Allergies:  Allergies  Allergen Reactions   Pioglitazone Nausea Only   Ace Inhibitors    Codeine    Sitagliptin Nausea And Vomiting    Medications: I have reviewed the patient's current medications.  Results for orders placed or performed during the hospital encounter of 05/20/21 (from the past 48 hour(s))  Comprehensive metabolic panel     Status: Abnormal   Collection Time: 05/20/21  3:24 PM  Result Value Ref Range   Sodium 138 135 - 145 mmol/L   Potassium 4.3 3.5 - 5.1 mmol/L   Chloride 103 98 - 111 mmol/L   CO2 17 (L) 22 - 32 mmol/L   Glucose, Bld 185 (H) 70 - 99 mg/dL    Comment: Glucose reference range applies only to samples taken after fasting for at least 8 hours.   BUN 13 8 - 23 mg/dL   Creatinine, Ser 1.22 0.61 - 1.24 mg/dL   Calcium 9.6 8.9 - 10.3 mg/dL   Total Protein 6.8 6.5 - 8.1 g/dL   Albumin 3.8 3.5 - 5.0 g/dL   AST 158 (H) 15 - 41 U/L   ALT 43 0 - 44 U/L   Alkaline Phosphatase 58 38 - 126 U/L   Total Bilirubin 2.7 (H) 0.3 - 1.2 mg/dL   GFR, Estimated >60 >60 mL/min    Comment: (NOTE) Calculated using the CKD-EPI Creatinine Equation (2021)     Anion gap 18 (H) 5 - 15    Comment: Performed at [facility], [address] [place], [place] [address]  CBC with Differential     Status: Abnormal   Collection Time: 05/20/21  3:24 PM  Result Value Ref Range   WBC 25.6 (H) 4.0 - 10.5 K/uL   RBC 5.47 4.22 - 5.81 MIL/uL   Hemoglobin 16.1 13.0 - 17.0 g/dL   HCT 47.1 39.0 - 52.0 %   MCV 86.1 80.0 - 100.0 fL   MCH 29.4 26.0 - 34.0 pg   MCHC 34.2 30.0 - 36.0 g/dL   RDW 13.3 11.5 - 15.5 %   Platelets 200 150 - 400 K/uL   nRBC 0.0 0.0 - 0.2 %   Neutrophils Relative % 90 %   Neutro Abs 23.0 (H) 1.7 - 7.7 K/uL   Lymphocytes Relative 2 %   Lymphs Abs 0.5 (L) 0.7 - 4.0 K/uL   Monocytes Relative 7 %   Monocytes Absolute 1.7 (H) 0.1 - 1.0 K/uL   Eosinophils Relative 0 %   Eosinophils Absolute 0.0 0.0 - 0.5 K/uL   Basophils Relative 0 %  Basophils Absolute 0.1 0.0 - 0.1 K/uL   WBC Morphology MORPHOLOGY UNREMARKABLE    RBC Morphology MORPHOLOGY UNREMARKABLE    Smear Review Normal platelet morphology    Immature Granulocytes 1 %   Abs Immature Granulocytes 0.24 (H) 0.00 - 0.07 K/uL    Comment: Performed at [facility], [address] [place], [place] [address]  CK     Status: Abnormal   Collection Time: 05/20/21  3:24 PM  Result Value Ref Range   Total CK 9,666 (H) 49 - 397 U/L    Comment: RESULT CONFIRMED BY MANUAL DILUTION KLW Performed at [facility], [address], [place], [place] [address]   Troponin I (High Sensitivity)     Status: Abnormal   Collection Time: 05/20/21  3:24 PM  Result Value Ref Range   Troponin I (High Sensitivity) 738 (HH) <18 ng/L    Comment: CRITICAL RESULT CALLED TO, READ BACK BY AND VERIFIED WITH MICHELLE ROMERO 05/20/21 1724 KLW (NOTE) Elevated high sensitivity troponin I (hsTnI) values and significant  changes across serial measurements may suggest ACS but many other  chronic and acute conditions are known to elevate hsTnI results.  Refer to the "Links" section for  chest pain algorithms and additional  guidance. Performed at [facility], [address] [place], [place] [address]   Lactic acid, plasma     Status: Abnormal   Collection Time: 05/20/21  3:24 PM  Result Value Ref Range   Lactic Acid, Venous 5.0 (HH) 0.5 - 1.9 mmol/L    Comment: CRITICAL RESULT CALLED TO, READ BACK BY AND VERIFIED WITH MICHELLE ROMERO 05/20/21 1632 KLW Performed at [facility], [address] [place], [place] [address]   Urinalysis, Routine w reflex microscopic Urine, Clean Catch     Status: Abnormal   Collection Time: 05/20/21  3:24 PM  Result Value Ref Range   Color, Urine YELLOW (A) YELLOW   APPearance CLEAR (A) CLEAR   Specific Gravity, Urine 1.009 1.005 - 1.030   pH 6.0 5.0 - 8.0   Glucose, UA 50 (A) NEGATIVE mg/dL   Hgb urine dipstick LARGE (A) NEGATIVE   Bilirubin Urine NEGATIVE NEGATIVE   Ketones, ur 20 (A) NEGATIVE mg/dL   Protein, ur 100 (A) NEGATIVE mg/dL   Nitrite NEGATIVE NEGATIVE   Leukocytes,Ua NEGATIVE NEGATIVE   RBC / HPF 0-5 0 - 5 RBC/hpf   WBC, UA 0-5 0 - 5 WBC/hpf   Bacteria, UA RARE (A) NONE SEEN   Squamous Epithelial / LPF NONE SEEN 0 - 5   Mucus PRESENT     Comment: Performed at [facility], [address], [place], [place] [address]  Resp Panel by RT-PCR (Flu A&B, Covid) Nasopharyngeal Swab     Status: None   Collection Time: 05/20/21  3:24 PM   Specimen: Nasopharyngeal Swab; Nasopharyngeal(NP) swabs in vial transport medium  Result Value Ref Range   SARS Coronavirus 2 by RT PCR NEGATIVE NEGATIVE    Comment: (NOTE) SARS-CoV-2 target nucleic acids are NOT DETECTED.  The SARS-CoV-2 RNA is generally detectable in upper respiratory specimens during the acute phase of infection. The lowest concentration of SARS-CoV-2 viral copies this assay can detect is 138 copies/mL. A negative result does not preclude SARS-Cov-2 infection and should not be used as the sole basis for treatment or other patient  management decisions. A negative result may occur with  improper specimen collection/handling, submission of specimen other than nasopharyngeal swab, presence of viral mutation(s) within the  areas targeted by this assay, and inadequate number of viral copies(<138 copies/mL). A negative result must be combined with clinical observations, patient history, and epidemiological information. The expected result is Negative.  Fact Sheet for Patients:  EntrepreneurPulse.com.au  Fact Sheet for Healthcare Providers:  IncredibleEmployment.be  This test is no t yet approved or cleared by the [place] FDA and  has been authorized for detection and/or diagnosis of SARS-CoV-2 by FDA under an Emergency Use Authorization (EUA). This EUA will remain  in effect (meaning this test can be used) for the duration of the COVID-19 declaration under Section 564(b)(1) of the Act, 21 U.S.C.section 360bbb-3(b)(1), unless the authorization is terminated  or revoked sooner.       Influenza A by PCR NEGATIVE NEGATIVE   Influenza B by PCR NEGATIVE NEGATIVE    Comment: (NOTE) The Xpert Xpress SARS-CoV-2/FLU/RSV plus assay is intended as an aid in the diagnosis of influenza from Nasopharyngeal swab specimens and should not be used as a sole basis for treatment. Nasal washings and aspirates are unacceptable for Xpert Xpress SARS-CoV-2/FLU/RSV testing.  Fact Sheet for Patients: EntrepreneurPulse.com.au  Fact Sheet for Healthcare Providers: IncredibleEmployment.be  This test is not yet approved or cleared by the [place] FDA and has been authorized for detection and/or diagnosis of SARS-CoV-2 by FDA under an Emergency Use Authorization (EUA). This EUA will remain in effect (meaning this test can be used) for the duration of the COVID-19 declaration under Section 564(b)(1) of the Act, 21 U.S.C. section 360bbb-3(b)(1), unless the  authorization is terminated or revoked.  Performed at [facility], [address] [place], [place] [address]   Culture, blood (routine x 2)     Status: None (Preliminary result)   Collection Time: 05/20/21  3:24 PM   Specimen: BLOOD  Result Value Ref Range   Specimen Description BLOOD RIGHT UPPER ARM    Special Requests      BOTTLES DRAWN AEROBIC AND ANAEROBIC Blood Culture results may not be optimal due to an excessive volume of blood received in culture bottles   Culture  Setup Time      ANAEROBIC BOTTLE ONLY NO ORGANISMS SEEN GRAM POSITIVE COCCI AEROBIC BOTTLE ONLY Organism ID to follow Performed at [facility], [address] [place], [place] [address]    Culture GRAM POSITIVE COCCI    Report Status PENDING   Culture, blood (routine x 2)     Status: None (Preliminary result)   Collection Time: 05/20/21  5:04 PM   Specimen: BLOOD  Result Value Ref Range   Specimen Description BLOOD LEFT ANTECUBITAL    Special Requests      BOTTLES DRAWN AEROBIC AND ANAEROBIC Blood Culture results may not be optimal due to an excessive volume of blood received in culture bottles   Culture      NO GROWTH < 24 HOURS Performed at [facility], [address] [place], [place] [address]    Report Status PENDING   Lactic acid, plasma     Status: Abnormal   Collection Time: 05/20/21  5:39 PM  Result Value Ref Range   Lactic Acid, Venous 3.0 (HH) 0.5 - 1.9 mmol/L    Comment: CRITICAL VALUE NOTED. VALUE IS CONSISTENT WITH PREVIOUSLY REPORTED/CALLED VALUE RWW Performed at [facility], [address] [place], [place] [address]   Troponin I (High Sensitivity)     Status: Abnormal   Collection Time: 05/20/21  5:39 PM  Result Value Ref Range   Troponin I (High  Sensitivity) 1,141 (HH) <18 ng/L    Comment: CRITICAL VALUE NOTED. VALUE IS CONSISTENT WITH PREVIOUSLY REPORTED/CALLED VALUE RWW (NOTE) Elevated high sensitivity troponin I (hsTnI) values and  significant  changes across serial measurements may suggest ACS but many other  chronic and acute conditions are known to elevate hsTnI results.  Refer to the "Links" section for chest pain algorithms and additional  guidance. Performed at [facility], [address] [place], [place] [address]   Procalcitonin     Status: None   Collection Time: 05/20/21  5:39 PM  Result Value Ref Range   Procalcitonin 2.79 ng/mL    Comment:        Interpretation: PCT > 2 ng/mL: Systemic infection (sepsis) is likely, unless other causes are known. (NOTE)       Sepsis PCT Algorithm           Lower Respiratory Tract                                      Infection PCT Algorithm    ----------------------------     ----------------------------         PCT < 0.25 ng/mL                PCT < 0.10 ng/mL          Strongly encourage             Strongly discourage   discontinuation of antibiotics    initiation of antibiotics    ----------------------------     -----------------------------       PCT 0.25 - 0.50 ng/mL            PCT 0.10 - 0.25 ng/mL               OR       >80% decrease in PCT            Discourage initiation of                                            antibiotics      Encourage discontinuation           of antibiotics    ----------------------------     -----------------------------         PCT >= 0.50 ng/mL              PCT 0.26 - 0.50 ng/mL               AND       <80% decrease in PCT              Encourage initiation of                                             antibiotics       Encourage continuation           of antibiotics    ----------------------------     -----------------------------        PCT >= 0.50 ng/mL                  PCT > 0.50 ng/mL  AND         increase in PCT                  Strongly encourage                                      initiation of antibiotics    Strongly encourage escalation           of antibiotics                                      -----------------------------                                           PCT <= 0.25 ng/mL                                                 OR                                        > 80% decrease in PCT                                      Discontinue / Do not initiate                                             antibiotics  Performed at [facility], [address] [place], [place] [address]   Lipase, blood     Status: None   Collection Time: 05/20/21  5:39 PM  Result Value Ref Range   Lipase 22 11 - 51 U/L    Comment: Performed at [facility], [address] [place], [place] [address]  Protime-INR     Status: Abnormal   Collection Time: 05/20/21  5:39 PM  Result Value Ref Range   Prothrombin Time 16.7 (H) 11.4 - 15.2 seconds   INR 1.4 (H) 0.8 - 1.2    Comment: (NOTE) INR goal varies based on device and disease states. Performed at [facility], [address] [place], [place] [address]   Lactic acid, plasma     Status: Abnormal   Collection Time: 05/20/21  8:47 PM  Result Value Ref Range   Lactic Acid, Venous 4.3 (HH) 0.5 - 1.9 mmol/L    Comment: CRITICAL VALUE NOTED. VALUE IS CONSISTENT WITH PREVIOUSLY REPORTED/CALLED VALUE RWW Performed at [facility], [place],  [address]   Troponin I (High Sensitivity)     Status: Abnormal   Collection Time: 05/20/21 11:00 PM  Result Value Ref Range   Troponin I (High Sensitivity) 1,163 (HH) <18 ng/L    Comment: CRITICAL VALUE NOTED. VALUE IS CONSISTENT WITH PREVIOUSLY REPORTED/CALLED VALUE HNM (NOTE) Elevated high sensitivity troponin I (hsTnI) values and significant  changes across serial measurements may suggest ACS but many other  chronic and acute conditions are known to elevate hsTnI results.  Refer to the "Links" section for chest pain algorithms and additional  guidance. Performed at [facility], [address] [place], [place]  [address]   APTT     Status: Abnormal   Collection Time: 05/20/21 11:55 PM  Result Value Ref Range   aPTT 85 (H) 24 - 36 seconds    Comment:        IF BASELINE aPTT IS ELEVATED, SUGGEST PATIENT RISK ASSESSMENT BE USED TO DETERMINE APPROPRIATE ANTICOAGULANT THERAPY. Performed at [facility], [address] [place], [place] [address]   Lactic acid, plasma     Status: None   Collection Time: 05/21/21  3:41 AM  Result Value Ref Range   Lactic Acid, Venous 1.5 0.5 - 1.9 mmol/L    Comment: Performed at [facility], [address] [place], [place] [address]  Basic metabolic panel     Status: Abnormal   Collection Time: 05/21/21  4:31 AM  Result Value Ref Range   Sodium 136 135 - 145 mmol/L   Potassium 3.7 3.5 - 5.1 mmol/L    Comment: HEMOLYSIS AT THIS LEVEL MAY AFFECT RESULT   Chloride 108 98 - 111 mmol/L   CO2 20 (L) 22 - 32 mmol/L   Glucose, Bld 137 (H) 70 - 99 mg/dL    Comment: Glucose reference range applies only to samples taken after fasting for at least 8 hours.   BUN 16 8 - 23 mg/dL   Creatinine, Ser 0.87 0.61 - 1.24 mg/dL   Calcium 8.4 (L) 8.9 - 10.3 mg/dL   GFR, Estimated >60 >60 mL/min    Comment: (NOTE) Calculated using the CKD-EPI Creatinine Equation (2021)    Anion gap 8 5 - 15    Comment: Performed at [facility], [address] [place], [place] [address]  Magnesium     Status: None   Collection Time: 05/21/21  4:31 AM  Result Value Ref Range   Magnesium 1.7 1.7 - 2.4 mg/dL    Comment: Performed at [facility], [address] [place], [place] [address]  Phosphorus     Status: None   Collection Time: 05/21/21  4:31 AM  Result Value Ref Range   Phosphorus 3.0 2.5 - 4.6 mg/dL    Comment: Performed at [facility], [address] [place], [place] [address]  CBC WITH DIFFERENTIAL     Status: Abnormal   Collection Time: 05/21/21  4:31 AM  Result Value Ref Range   WBC 19.3 (H) 4.0 - 10.5 K/uL   RBC 4.88 4.22 -  5.81 MIL/uL   Hemoglobin 14.4 13.0 - 17.0 g/dL   HCT 41.7 39.0 - 52.0 %   MCV 85.5 80.0 - 100.0 fL   MCH 29.5 26.0 - 34.0 pg   MCHC 34.5 30.0 - 36.0 g/dL   RDW 13.5 11.5 - 15.5 %   Platelets 171 150 - 400 K/uL   nRBC 0.0 0.0 - 0.2 %   Neutrophils Relative % 90 %   Neutro Abs 17.3 (H) 1.7 - 7.7 K/uL   Lymphocytes Relative 4 %   Lymphs Abs 0.8 0.7 - 4.0 K/uL   Monocytes Relative 5 %   Monocytes Absolute 1.0 0.1 - 1.0 K/uL   Eosinophils Relative 0 %   Eosinophils Absolute 0.0 0.0 - 0.5 K/uL   Basophils Relative 0 %   Basophils Absolute  0.0 0.0 - 0.1 K/uL   Immature Granulocytes 1 %   Abs Immature Granulocytes 0.12 (H) 0.00 - 0.07 K/uL    Comment: Performed at [facility], [address] [place], [place] [address]  Protime-INR     Status: Abnormal   Collection Time: 05/21/21  4:31 AM  Result Value Ref Range   Prothrombin Time 17.6 (H) 11.4 - 15.2 seconds   INR 1.5 (H) 0.8 - 1.2    Comment: (NOTE) INR goal varies based on device and disease states. Performed at [facility], [address] [place], [place] [address]   CK     Status: Abnormal   Collection Time: 05/21/21  4:31 AM  Result Value Ref Range   Total CK 6,770 (H) 49 - 397 U/L    Comment: RESULT CONFIRMED BY MANUAL DILUTION HNM Performed at [facility], [place], [place] [address]   Heparin level (unfractionated)     Status: Abnormal   Collection Time: 05/21/21  4:31 AM  Result Value Ref Range   Heparin Unfractionated 0.12 (L) 0.30 - 0.70 IU/mL    Comment: (NOTE) The clinical reportable range upper limit is being lowered to >1.10 to align with the FDA approved guidance for the current laboratory assay.  If heparin results are below expected values, and patient dosage has  been confirmed, suggest follow up testing of antithrombin III levels. Performed at [facility], [address] [place], [place] [address]     DG Chest 1 View  Result Date:  05/20/2021 CLINICAL DATA:  Fall, found down EXAM: CHEST  1 VIEW COMPARISON:  11/14/2018 FINDINGS: Mild cardiomegaly. Both lungs are clear. The visualized skeletal structures are unremarkable. IMPRESSION: Mild cardiomegaly without acute abnormality of the lungs in AP portable projection. Electronically Signed   By: Delanna Ahmadi M.D.   On: 05/20/2021 16:48   CT Head Wo Contrast  Result Date: 05/20/2021 CLINICAL DATA:  Un witnessed fall with headaches, initial encounter EXAM: CT HEAD WITHOUT CONTRAST TECHNIQUE: Contiguous axial images were obtained from the base of the skull through the vertex without intravenous contrast. COMPARISON:  05/20/2021 FINDINGS: Brain: No evidence of acute infarction, hemorrhage, hydrocephalus, extra-axial collection or mass lesion/mass effect. Chronic atrophic and ischemic changes are noted stable in appearance from the prior exam. Vascular: No hyperdense vessel or unexpected calcification. Skull: Normal. Negative for fracture or focal lesion. Sinuses/Orbits: No acute finding. Other: None. IMPRESSION: Chronic atrophic and ischemic changes stable from the recent exam. No acute abnormality noted. Electronically Signed   By: Inez Catalina M.D.   On: 05/20/2021 21:30   CT Head Wo Contrast  Result Date: 05/20/2021 CLINICAL DATA:  Head trauma, minor (Age >= 65y) EXAM: CT HEAD WITHOUT CONTRAST TECHNIQUE: Contiguous axial images were obtained from the base of the skull through the vertex without intravenous contrast. COMPARISON:  CT head 11/14/2018 FINDINGS: Brain: There is no acute intracranial hemorrhage, mass effect, or edema. Gray-white differentiation is preserved. There is no extra-axial fluid collection. Prominence of the ventricles and sulci reflects similar parenchymal volume loss. Minimal patchy low density in the supratentorial white matter is nonspecific but may reflect minor chronic microvascular ischemic changes. Vascular: No hyperdense vessel or unexpected calcification.  Skull: Calvarium is unremarkable. Sinuses/Orbits: Mild mucosal thickening.  Orbits are unremarkable. Other: Right periorbital soft tissue swelling. Mastoid air cells are clear. IMPRESSION: No evidence of acute intracranial injury. Electronically Signed   By: Macy Mis M.D.   On: 05/20/2021 16:32   CT  Cervical Spine Wo Contrast  Result Date: 05/20/2021 CLINICAL DATA:  Neck trauma (Age >= 65y) EXAM: CT CERVICAL SPINE WITHOUT CONTRAST TECHNIQUE: Multidetector CT imaging of the cervical spine was performed without intravenous contrast. Multiplanar CT image reconstructions were also generated. COMPARISON:  2020 FINDINGS: Alignment: Stable. Skull base and vertebrae: Stable vertebral body heights. No acute fracture. Soft tissues and spinal canal: No prevertebral fluid or swelling. No visible canal hematoma. Disc levels: Multilevel degenerative changes are present including disc space narrowing, endplate osteophytes, and facet and uncovertebral hypertrophy. Upper chest: No apical lung mass. A small nodule at the left lung apex was present in 2020 and is therefore benign. Other: Exophytic or adherent tissue within the right oropharynx (series 3, image 44) extending toward the piriformis sinus. IMPRESSION: No acute cervical spine fracture. Exophytic or adherent tissue within the right oropharynx. While a mass is difficult to exclude, this is favored to represent small volume retained ingested material. Electronically Signed   By: Macy Mis M.D.   On: 05/20/2021 16:41   CT ABDOMEN PELVIS W CONTRAST  Result Date: 05/20/2021 CLINICAL DATA:  Abdominal pain, nonlocalized EXAM: CT ABDOMEN AND PELVIS WITH CONTRAST TECHNIQUE: Multidetector CT imaging of the abdomen and pelvis was performed using the standard protocol following bolus administration of intravenous contrast. CONTRAST:  160mL OMNIPAQUE IOHEXOL 300 MG/ML  SOLN COMPARISON:  None. FINDINGS: Lower chest: No focal pulmonary opacity or pleural effusion. No  pericardial effusion. Hepatobiliary: Hepatic steatosis. No focal liver abnormality. The hepatic and portal veins are patent. No intra or extrahepatic biliary ductal dilatation. A Phrygian cap is noted on an otherwise normal gallbladder. No gallstones or gallbladder wall thickening. Pancreas: In the body of the pancreas, there is a 2.0 x 2.7 x 2.0 cm mass (series 2, image 20 and series 5, image 39), this central low density and mild adjacent stranding. Pancreas is otherwise unremarkable. Spleen: Normal in size without focal abnormality. Adrenals/Urinary Tract: The adrenal glands are unremarkable. The kidneys enhance symmetrically with no hydronephrosis. Multiple low-attenuation lesions in the right greater than left kidney, which are too small to characterize but most likely renal cysts. The bladder contains a Foley. Apparent wall thickening is likely secondary to under distension. Stomach/Bowel: Stomach is within normal limits. Appendix appears normal. No evidence of bowel wall thickening, distention, or inflammatory changes. Diverticulosis without evidence of diverticulitis. Vascular/Lymphatic: Aortic atherosclerosis. No enlarged abdominal or pelvic lymph nodes. Reproductive: Prostatomegaly. Other: No abdominal wall hernia or abnormality. No abdominopelvic ascites. Musculoskeletal: Right subcapital femoral neck fracture (series 2, image 78), moderately impacted. Degenerative changes in the lumbar spine with approximately 40% height loss in the anterior aspect of L1, which appears chronic. Bilateral L5-S1 assimilation joints. IMPRESSION: 1. 2.7 cm mass in the body of the pancreas, with central low density, which may represent a neoplasm or focal collection. Adjacent surrounding suggest inflammatory process. This may represent neoplasm with superimposed acute pancreatitis. Correlate with lipase. An MRI pancreatic protocol is recommended. 2. Right subcapital femoral neck fracture, moderately impacted, as seen on the  same-day radiograph. 3. Chronic appearing compression deformity of L1, although no prior spine imaging is available. Correlate with point tenderness. If there is concern that this fracture may be acute, consider MRI. 4. Wall thickening in the bladder, which may be secondary to underdistension, given the presence of a Foley. These results were called by telephone at the time of interpretation on 05/20/2021 at 7:40 pm to provider AMY COX , who verbally acknowledged these results. Electronically Signed   By: Bryson Ha  Vasan M.D.   On: 05/20/2021 19:44   DG Hip Unilat W or Wo Pelvis 2-3 Views Right  Result Date: 05/20/2021 CLINICAL DATA:  Right hip pain after fall, abrasions EXAM: DG HIP (WITH OR WITHOUT PELVIS) 2-3V RIGHT COMPARISON:  None. FINDINGS: Frontal view of the pelvis as well as frontal and cross-table lateral views of the right hip are obtained. There is an impacted subcapital right femoral neck fracture. No evidence of dislocation. Left hip is unremarkable. Remainder of the bony pelvis is normal. IMPRESSION: 1. Impacted subcapital right femoral neck fracture. Electronically Signed   By: Randa Ngo M.D.   On: 05/20/2021 16:47    Review of Systems Blood pressure 137/88, pulse 83, temperature 98 F (36.7 C), temperature source Oral, resp. rate (!) 26, height 6\' 1"  (1.854 m), weight 95.3 kg, SpO2 100 %. 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  He is able flex extend his toes as palpable pulses Assessment/Plan: X-ray shows displaced fracture not impacted fracture and mild arthritis.  Plan is for hip hemiarthroplasty versus possible total hip depending on findings at surgery with the extent of arthritis when he is medically stable.  Hessie Knows 05/21/2021, 9:21 AM

## 2021-05-21 NOTE — Op Note (Signed)
05/21/2021  2:19 PM  PATIENT:  Pedro Mccoy  70 y.o. male  PRE-OPERATIVE DIAGNOSIS:  Hip fracture subcapital displaced with osteoarthritis  POST-OPERATIVE DIAGNOSIS:  Hip fracture subcapital displaced with osteoarthritis  PROCEDURE:  Procedure(s): ANTERIOR HIP TOTAL ARTHROPLASTY (Right)  SURGEON: Laurene Footman, MD  ASSISTANTS: None  ANESTHESIA:   general  EBL:  Total I/O In: 800 [I.V.:800] Out: 525 [Urine:500; Blood:25]  BLOOD ADMINISTERED:none  DRAINS:  Incisional wound VAC    LOCAL MEDICATIONS USED:  MARCAINE    and OTHER Exparel  SPECIMEN: Right femoral head and neck  DISPOSITION OF SPECIMEN:  PATHOLOGY  COUNTS:  YES  TOURNIQUET:  * No tourniquets in log *  IMPLANTS: Medacta AMIS 6 standard stem with metal M 28 mm head, 62 Mpact TM cup and liner  DICTATION: .Dragon Dictation   The patient was brought to the operating room and after general anesthesia was obtained patient was placed on the operative table with the ipsilateral foot into the Medacta attachment, contralateral leg on a well-padded table. C-arm was brought in and preop template x-ray taken. After prepping and draping in usual sterile fashion appropriate patient identification and timeout procedures were completed. Anterior approach to the hip was obtained and centered over the greater trochanter and TFL muscle. The subcutaneous tissue was incised hemostasis being achieved by electrocautery. TFL fascia was incised and the muscle retracted laterally deep retractor placed. The lateral femoral circumflex vessels were identified and ligated. The anterior capsule was exposed and a capsulotomy performed.  There is a significantly displaced subcapital hip fracture and neck cut was performed the neck was identified and a femoral neck cut carried out with a saw. The head was removed without difficulty and showed mild femoral head degenerative changes and more significant changes to the acetabulum.  Because of these  degenerative changes determination was made a total joint to be more appropriate than hemiarthroplasty.  Reaming was carried out to 62 mm and a 62 mm cup trial gave appropriate tightness to the acetabular component a 62 DM cup was impacted into position. The leg was then externally rotated and ischiofemoral and pubofemoral releases carried out. The femur was sequentially broached to a size 6, size 6 standard with S head trials were placed and the final components chosen. The 6 standard stem was inserted along with a metal M 28 mm head and 62 mm liner. The hip was reduced and was stable the wound was thoroughly irrigated with fibrillar placed along the posterior capsule and medial neck. The deep fascia ws closed using a heavy Quill after infiltration of 30 cc of quarter percent Sensorcaine with epinephrine mixed with Exparel .3-0 V-loc to close the skin with skin staples.  Incisional wound VAC applied and patient was sent to recovery in stable condition.   PLAN OF CARE: Admit to inpatient  '

## 2021-05-21 NOTE — Anesthesia Procedure Notes (Signed)
Procedure Name: Intubation Date/Time: 05/21/2021 12:57 PM Performed by: Lia Foyer, CRNA Pre-anesthesia Checklist: Patient identified, Emergency Drugs available, Suction available and Patient being monitored Patient Re-evaluated:Patient Re-evaluated prior to induction Oxygen Delivery Method: Circle system utilized Preoxygenation: Pre-oxygenation with 100% oxygen Induction Type: IV induction Ventilation: Mask ventilation without difficulty Laryngoscope Size: McGraph and 4 Grade View: Grade I Tube type: Oral Tube size: 7.5 mm Number of attempts: 1 Airway Equipment and Method: Stylet, Oral airway and Video-laryngoscopy Placement Confirmation: ETT inserted through vocal cords under direct vision, positive ETCO2 and breath sounds checked- equal and bilateral Secured at: 21 cm Tube secured with: Tape Dental Injury: Teeth and Oropharynx as per pre-operative assessment

## 2021-05-21 NOTE — Progress Notes (Addendum)
Progress Note    Pedro Mccoy   BSW:[id number]  DOB: 1950-12-15  DOA: 05/20/2021     1 Date of Service: 05/21/2021   Clinical Course Past medical history of HTN, HLD, NIDDM, schizophrenia, anxiety, depression, tremors.  Presents with complaints of mechanical fall.  Found to have hip fracture as well as rhabdomyolysis. Underwent hip surgery.  Cardiology and orthopedics following.  Assessment and Plan Right femoral neck impacted subcapital fracture. Present on admission. Mechanical fall. Prior history of recurrent fall. Appreciate consultation from Dr. Youlanda Mighty. Underwent anterior total hip arthroplasty right. Currently on therapeutic anticoagulation. Weightbearing and PT OT.  Orthopedics pain control per orthopedics.  Nontraumatic rhabdomyolysis. Patient had a mechanical fall.  Patient was on the ground for 3 hours after the fall. CK elevated. Rhabdomyolysis likely from the hypotension treated with IV fluids.  Elevated troponin. Possible demand ischemia. I do not think the patient actually has non-STEMI. EKG unremarkable. Started on heparin drip.  we will continue heparin drip postoperatively with a delayed start. Echocardiogram shows preserved EF. Likely troponin elevation secondary to rhabdomyolysis with CK elevation.  Consult for SIRS.  Sepsis ruled out. Heart rate elevated at the time of admission with leukocytosis and lactic acidosis. Suspect this is actually his been secondary to fall and trauma rather than sepsis. Currently on broad-spectrum antibiotic. Will monitor.  Staph epi bacteremia 1 out of 4 blood culture bottles positive for staph epi. Suspect this is more likely contamination rather than an actual infection. Will repeat the culture. Most likely given lack of concern for clinical infection we will stop antibiotic tomorrow.  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  Concerning for neoplasm or focal collection. Will  require an MRI pancreatic protocol once medically stable.  Type 2 diabetes mellitus, without any long-term insulin use.  Controlled. Metformin currently on hold. Will continue sliding scale insulin.  Hyperlipidemia. Continue Crestor.  History of schizophrenia pretension patient is on Haldol, risperidone and lamotrigine. Will continue.  Depression. On fluoxetine furosemide. On Ativan as well.  We will continue.  Multiple bruising and scratch marks. Present on admission. Monitor.  L1 compression fracture. Chronic appearing. No pain. Outpatient follow-up with PT OT.  Subjective:  Seen after the surgery.  No acute complaint.  Was eating dinner.  No nausea no vomiting.  Objective Vitals:   05/21/21 1600 05/21/21 1615 05/21/21 1630 05/21/21 1700  BP: 136/80 131/79 134/82 (!) 144/86  Pulse: 69 67 69 69  Resp: 19 16 18 20   Temp:   98 F (36.7 C) 98.7 F (37.1 C)  TempSrc:      SpO2: 96% 96% 96% 96%  Weight:      Height:       85.3 kg  Exam General: Appear in mild distress, no Rash; Oral Mucosa Clear, moist. no Abnormal Neck Mass Or lumps, Conjunctiva normal  Cardiovascular: S1 and S2 Present, no Murmur, Respiratory: good respiratory effort, Bilateral Air entry present and CTA, no Crackles, no wheezes Abdomen: Bowel Sound present, Soft and no tenderness Extremities: no Pedal edema Neurology: alert and oriented to time, place, and person affect appropriate. no new focal deficit, resting tremor seen bilaterally. Gait not checked due to patient safety concerns    Labs / Other Information My review of labs, imaging, notes and other tests is significant for  and CK elevated troponin and CK.     Disposition Plan: Status is: Inpatient  Remains inpatient appropriate because: Underwent hip surgery.  Requires pain control.  Further cardiac work-up is also needed.  Time spent: 35 minutes [facility] 05/21/2021, 6:51 PM

## 2021-05-21 NOTE — Anesthesia Preprocedure Evaluation (Signed)
Anesthesia Evaluation  Patient identified by MRN, date of birth, ID band Patient awake    Reviewed: Allergy & Precautions, NPO status , Patient's Chart, lab work & pertinent test results  History of Anesthesia Complications Negative for: history of anesthetic complications  Airway Mallampati: II  TM Distance: >3 FB Neck ROM: full    Dental  (+) Missing, Poor Dentition   Pulmonary neg pulmonary ROS, neg shortness of breath,    Pulmonary exam normal        Cardiovascular Exercise Tolerance: Good hypertension, (-) anginaNormal cardiovascular exam     Neuro/Psych PSYCHIATRIC DISORDERS  Neuromuscular disease    GI/Hepatic negative GI ROS, Neg liver ROS,   Endo/Other  negative endocrine ROS  Renal/GU      Musculoskeletal   Abdominal   Peds  Hematology negative hematology ROS (+)   Anesthesia Other Findings Possible NSTEMI but cleared by cardiology  Past Medical History: No date: Hypertension .: Hypertriglyceridemia  Past Surgical History: No date: NO PAST SURGERIES  BMI    Body Mass Index: 24.80 kg/m      Reproductive/Obstetrics negative OB ROS                             Anesthesia Physical Anesthesia Plan  ASA: 4  Anesthesia Plan: General ETT   Post-op Pain Management:    Induction: Intravenous  PONV Risk Score and Plan: Ondansetron, Dexamethasone, Midazolam and Treatment may vary due to age or medical condition  Airway Management Planned: Oral ETT  Additional Equipment:   Intra-op Plan:   Post-operative Plan: Extubation in OR  Informed Consent: I have reviewed the patients History and Physical, chart, labs and discussed the procedure including the risks, benefits and alternatives for the proposed anesthesia with the patient or authorized representative who has indicated his/her understanding and acceptance.     Dental Advisory Given  Plan Discussed with:  Anesthesiologist, CRNA and Surgeon  Anesthesia Plan Comments: (Patient consented for risks of anesthesia including but not limited to:  - adverse reactions to medications - damage to eyes, teeth, lips or other oral mucosa - nerve damage due to positioning  - sore throat or hoarseness - Damage to heart, brain, nerves, lungs, other parts of body or loss of life  Patient voiced understanding.)        Anesthesia Quick Evaluation

## 2021-05-21 NOTE — Consult Note (Signed)
ANTICOAGULATION CONSULT NOTE  Pharmacy Consult for IV Heparin Indication: chest pain/ACS  Patient Measurements: Height: 6\' 1"  (185.4 cm) Weight: 85.3 kg (188 lb) IBW/kg (Calculated) : 79.9 Heparin Dosing Weight: 95.3 kg  Labs: Recent Labs    05/20/21 1524 05/20/21 1739 05/20/21 2300 05/20/21 2355 05/21/21 0431  HGB 16.1  --   --   --  14.4  HCT 47.1  --   --   --  41.7  PLT 200  --   --   --  171  APTT  --   --   --  85*  --   LABPROT  --  16.7*  --   --  17.6*  INR  --  1.4*  --   --  1.5*  HEPARINUNFRC  --   --   --   --  0.12*  CREATININE 1.22  --   --   --  0.87  CKTOTAL 9,666*  --   --   --  6,770*  TROPONINIHS 738* 1,141* 1,163*  --   --      Estimated Creatinine Clearance: 89.3 mL/min (by C-G formula based on SCr of 0.87 mg/dL).   Medical History: Past Medical History:  Diagnosis Date   Hypertension    Hypertriglyceridemia .    Medications:  No anticoagulation prior to admission per my chart review  Assessment: Patient is a 70 y/o M with medical history as above who was BIBEMS from aunt's house with falls and disorientation. Patient being admitted with sepsis. Labs significant for elevated troponin, CK, WBC, lactic acidosis. Pharmacy consulted to initiate and manage heparin infusion for suspected ACS.  Baseline CBC acceptable. Baseline aPTT and PT-INR are pending.   Goal of Therapy:  Heparin level 0.3-0.7 units/ml Monitor platelets by anticoagulation protocol: Yes   Plan:  Heparin HELD for hip surgery. Okay to resume 6hr after surgery per Dr Rudene Christians and Dr Marlowe Sax Current order d/c New heparin infusion order to start 11/23 @ 0000 placed as verbal order. Will recheck HL 6 hrs after re-start heparin.   Cornelio Parkerson Rodriguez-Guzman PharmD, BCPS 05/21/2021 5:48 PM

## 2021-05-21 NOTE — Consult Note (Signed)
Brief cardiology consult  Impression Preop for right hip surgery Recent mechanical fall Rhabdomyolysis Elevated CK and troponin Possible sepsis Mental status History of schizophrenia Diabetes type 2 Unclear if this is a non-STEMI with elevated troponins and CKs  . Plan Agree with ICU level care Continue IV broad-spectrum antibiotics for infection Maintain adequate hydration because of possible rhabdo Follow-up further CKs and troponins for evaluation of possible non-STEMI versus demand ischemia Echocardiogram today to assess left ventricular function wall motion Continue hypertension management and control telmisartan metoprolol nifedipine Continue Crestor therapy for lipid management Patient is an acceptable risk for hip surgery at least moderate at this point Recommend proceed with hip repair today Full note to follow

## 2021-05-21 NOTE — Progress Notes (Signed)
MD paged for pain analgesic in case pt becomes in pain d/t recent hip surgery today. Tylenol is only prn for pain currently. Pt denies any pain at this time. Percocet 5/325mg  expired.

## 2021-05-21 NOTE — Progress Notes (Signed)
PHARMACY - PHYSICIAN COMMUNICATION CRITICAL VALUE ALERT - BLOOD CULTURE IDENTIFICATION (BCID)  Pedro Mccoy is an 70 y.o. male who presented to [facility] on 05/20/2021 with a chief complaint of falls, disorientation; admitted with right femoral fracture  Assessment: Blood culture growing GPC 1/4-BCID MRSE. On empiric broad spectrum antibiotics d/t sepsis at presentation (WBC 25.6 >19.3, LA 4.3)  Name of physician (or Provider) Contacted: Dr. Berle Mull, MD  Current antibiotics: vancomycin, cefepime and metronidazole  Changes to prescribed antibiotics recommended:  Recommendations accepted by provider  Suspect likely contaminant. Per MD, plan is to D/C antibiotics after procedure.  Results for orders placed or performed during the hospital encounter of 05/20/21  Blood Culture ID Panel (Reflexed) (Collected: 05/20/2021  3:24 PM)  Result Value Ref Range   Enterococcus faecalis NOT DETECTED NOT DETECTED   Enterococcus Faecium NOT DETECTED NOT DETECTED   Listeria monocytogenes NOT DETECTED NOT DETECTED   Staphylococcus species DETECTED (A) NOT DETECTED   Staphylococcus aureus (BCID) NOT DETECTED NOT DETECTED   Staphylococcus epidermidis DETECTED (A) NOT DETECTED   Staphylococcus lugdunensis NOT DETECTED NOT DETECTED   Streptococcus species NOT DETECTED NOT DETECTED   Streptococcus agalactiae NOT DETECTED NOT DETECTED   Streptococcus pneumoniae NOT DETECTED NOT DETECTED   Streptococcus pyogenes NOT DETECTED NOT DETECTED   A.calcoaceticus-baumannii NOT DETECTED NOT DETECTED   Bacteroides fragilis NOT DETECTED NOT DETECTED   Enterobacterales NOT DETECTED NOT DETECTED   Enterobacter cloacae complex NOT DETECTED NOT DETECTED   Escherichia coli NOT DETECTED NOT DETECTED   Klebsiella aerogenes NOT DETECTED NOT DETECTED   Klebsiella oxytoca NOT DETECTED NOT DETECTED   Klebsiella pneumoniae NOT DETECTED NOT DETECTED   Proteus species NOT DETECTED NOT DETECTED   Salmonella species NOT  DETECTED NOT DETECTED   Serratia marcescens NOT DETECTED NOT DETECTED   Haemophilus influenzae NOT DETECTED NOT DETECTED   Neisseria meningitidis NOT DETECTED NOT DETECTED   Pseudomonas aeruginosa NOT DETECTED NOT DETECTED   Stenotrophomonas maltophilia NOT DETECTED NOT DETECTED   Candida albicans NOT DETECTED NOT DETECTED   Candida auris NOT DETECTED NOT DETECTED   Candida glabrata NOT DETECTED NOT DETECTED   Candida krusei NOT DETECTED NOT DETECTED   Candida parapsilosis NOT DETECTED NOT DETECTED   Candida tropicalis NOT DETECTED NOT DETECTED   Cryptococcus neoformans/gattii NOT DETECTED NOT DETECTED   Methicillin resistance mecA/C DETECTED (A) NOT [place], PharmD Pharmacy Resident  05/21/2021 12:20 PM

## 2021-05-21 NOTE — Progress Notes (Signed)
Pharmacy Antibiotic Note  Pedro Mccoy is a 70 y.o. male admitted on 05/20/2021. Pharmacy has been consulted for vancomycin and cefepime dosing.  05/21/21 Day 2: afebrile, WBC 25.6 >19.3. Cultures: NGTD  Plan: Modify vancomycin to 1250 mg IV q12h. Given improvement in renal function Goal AUC 400-550 Expected AUC: 464 SCr used: 0.87  Continue Cefepime 2 g IV q8h   Height: 6\' 1"  (425.9 cm) Weight: 95.3 kg (210 lb) IBW/kg (Calculated) : 79.9  Temp (24hrs), Avg:98.3 F (36.8 C), Min:98 F (36.7 C), Max:98.6 F (37 C)  Recent Labs  Lab 05/20/21 1524 05/20/21 1739 05/20/21 2047 05/21/21 0341 05/21/21 0431  WBC 25.6*  --   --   --  19.3*  CREATININE 1.22  --   --   --  0.87  LATICACIDVEN 5.0* 3.0* 4.3* 1.5  --      Estimated Creatinine Clearance: 89.3 mL/min (by C-G formula based on SCr of 0.87 mg/dL).    Allergies  Allergen Reactions   Pioglitazone Nausea Only   Ace Inhibitors    Codeine    Sitagliptin Nausea And Vomiting    Antimicrobials this admission: Cefepime 11/21 >> Metronidazole 11/21 >> Vancomycin 11/21 >>   Microbiology results: 11/21 BCx: pending   Thank you for allowing pharmacy to be a part of this patient's care.  Dorothe Pea, PharmD, BCPS Clinical Pharmacist 05/21/2021 8:14 AM

## 2021-05-21 NOTE — Plan of Care (Signed)
Pt resting well and denies pain from previous surgery. RR even and unlabored. Pt remains in NSR.   Problem: Education: Goal: Knowledge of General Education information will improve Description: Including pain rating scale, medication(s)/side effects and non-pharmacologic comfort measures Outcome: Progressing   Problem: Clinical Measurements: Goal: Ability to maintain clinical measurements within normal limits will improve Outcome: Progressing Goal: Respiratory complications will improve Outcome: Progressing Goal: Cardiovascular complication will be avoided Outcome: Progressing

## 2021-05-21 NOTE — Anesthesia Postprocedure Evaluation (Signed)
Anesthesia Post Note  Patient: Pedro Mccoy  Procedure(s) Performed: ANTERIOR HIP TOTAL ARTHROPLASTY (Right: Hip)  Patient location during evaluation: PACU Anesthesia Type: General Level of consciousness: awake and alert Pain management: pain level controlled Vital Signs Assessment: post-procedure vital signs reviewed and stable Respiratory status: spontaneous breathing, nonlabored ventilation, respiratory function stable and patient connected to nasal cannula oxygen Cardiovascular status: blood pressure returned to baseline and stable Postop Assessment: no apparent nausea or vomiting Anesthetic complications: no   No notable events documented.   Last Vitals:  Vitals:   05/21/21 1500 05/21/21 1515  BP: 114/69 128/68  Pulse: 77 80  Resp: 14 16  Temp:    SpO2: 94% 94%    Last Pain:  Vitals:   05/21/21 1500  TempSrc:   PainSc: 0-No pain                 Precious Haws Tanaka Gillen

## 2021-05-22 ENCOUNTER — Encounter: Payer: Self-pay | Admitting: Orthopedic Surgery

## 2021-05-22 LAB — CBC
HCT: 33 % — ABNORMAL LOW (ref 39.0–52.0)
HCT: 34.2 % — ABNORMAL LOW (ref 39.0–52.0)
HCT: 30.4 % — ABNORMAL LOW (ref 39.0–52.0)
Hemoglobin: 11.5 g/dL — ABNORMAL LOW (ref 13.0–17.0)
Hemoglobin: 11.4 g/dL — ABNORMAL LOW (ref 13.0–17.0)
Hemoglobin: 10.8 g/dL — ABNORMAL LOW (ref 13.0–17.0)
MCH: 29.8 pg (ref 26.0–34.0)
MCH: 28.9 pg (ref 26.0–34.0)
MCH: 30.3 pg (ref 26.0–34.0)
MCHC: 34.8 g/dL (ref 30.0–36.0)
MCHC: 33.3 g/dL (ref 30.0–36.0)
MCHC: 35.5 g/dL (ref 30.0–36.0)
MCV: 85.5 fL (ref 80.0–100.0)
MCV: 86.8 fL (ref 80.0–100.0)
MCV: 85.4 fL (ref 80.0–100.0)
Platelets: 152 10*3/uL (ref 150–400)
Platelets: 165 10*3/uL (ref 150–400)
Platelets: 143 10*3/uL — ABNORMAL LOW (ref 150–400)
RBC: 3.86 MIL/uL — ABNORMAL LOW (ref 4.22–5.81)
RBC: 3.94 MIL/uL — ABNORMAL LOW (ref 4.22–5.81)
RBC: 3.56 MIL/uL — ABNORMAL LOW (ref 4.22–5.81)
RDW: 13.7 % (ref 11.5–15.5)
RDW: 13.7 % (ref 11.5–15.5)
RDW: 13.6 % (ref 11.5–15.5)
WBC: 15.3 10*3/uL — ABNORMAL HIGH (ref 4.0–10.5)
WBC: 14.6 10*3/uL — ABNORMAL HIGH (ref 4.0–10.5)
WBC: 12.3 10*3/uL — ABNORMAL HIGH (ref 4.0–10.5)
nRBC: 0 % (ref 0.0–0.2)
nRBC: 0 % (ref 0.0–0.2)
nRBC: 0 % (ref 0.0–0.2)

## 2021-05-22 LAB — BASIC METABOLIC PANEL
Anion gap: 6 (ref 5–15)
BUN: 17 mg/dL (ref 8–23)
CO2: 21 mmol/L — ABNORMAL LOW (ref 22–32)
Calcium: 7.8 mg/dL — ABNORMAL LOW (ref 8.9–10.3)
Chloride: 108 mmol/L (ref 98–111)
Creatinine, Ser: 0.82 mg/dL (ref 0.61–1.24)
GFR, Estimated: >60 mL/min (ref >60)
Glucose, Bld: 149 mg/dL — ABNORMAL HIGH (ref 70–99)
Potassium: 3.8 mmol/L (ref 3.5–5.1)
Sodium: 135 mmol/L (ref 135–145)

## 2021-05-22 LAB — CK: Total CK: 2329 U/L — ABNORMAL HIGH (ref 49–397)

## 2021-05-22 LAB — MAGNESIUM: Magnesium: 2.5 mg/dL — ABNORMAL HIGH (ref 1.7–2.4)

## 2021-05-22 LAB — HEPARIN LEVEL (UNFRACTIONATED): Heparin Unfractionated: <0.1 IU/mL — ABNORMAL LOW (ref 0.30–0.70)

## 2021-05-22 MED ORDER — HEPARIN BOLUS VIA INFUSION
2600.0000 [IU] | Freq: Once | INTRAVENOUS | Status: AC
  Administered 2021-05-22: 2600 [IU] via INTRAVENOUS
  Filled 2021-05-22: qty 2600

## 2021-05-22 MED ORDER — OXYCODONE-ACETAMINOPHEN 5-325 MG PO TABS: 1.0000 | ORAL_TABLET | Freq: Four times a day (QID) | ORAL | 0 refills | Status: AC | PRN

## 2021-05-22 MED ORDER — FENTANYL CITRATE PF 50 MCG/ML IJ SOSY
12.5000 ug | PREFILLED_SYRINGE | Freq: Once | INTRAMUSCULAR | Status: AC
  Administered 2021-05-23: 12.5 ug via INTRAVENOUS
  Filled 2021-05-22: qty 1

## 2021-05-22 MED ORDER — SODIUM CHLORIDE 0.9 % IV SOLN
INTRAVENOUS | Status: DC
Start: 2021-05-22 — End: 2021-05-23

## 2021-05-22 NOTE — Care Management Important Message (Signed)
Important Message  Patient Details  Name: Pedro Mccoy MRN: [id number] Date of Birth: [birth year]   Medicare Important Message Given:  Yes     [name] 05/22/2021, 12:03 PM

## 2021-05-22 NOTE — Evaluation (Signed)
Physical Therapy Evaluation Patient Details Name: Pedro Mccoy MRN: [id number] DOB: [birth year] Today's Date: 05/22/2021  History of Present Illness  Pedro Mccoy is a 70 y.o. male with medical history significant for hypertension, hyperlipidemia, non-insulin-dependent diabetes mellitus, schizophrenia, anxiety, depression, who presents to the emergency department from home for chief concerns of a fall. S/P THA.   Clinical Impression  Patient received in bed, agreeable to PT session. Pleasant. Reports minimal pain during mobility. Required min assist for bed mobility. Min assist for sit to stand and for ambulation from bed to recliner using RW. Patient is WBAT on R LE. He will continue to benefit from skilled PT while here to improve functional independence and safety.         Recommendations for follow up therapy are one component of a multi-disciplinary discharge planning process, led by the attending physician.  Recommendations may be updated based on patient status, additional functional criteria and insurance authorization.  Follow Up Recommendations Home health PT    Assistance Recommended at Discharge Frequent or constant Supervision/Assistance  Functional Status Assessment Patient has had a recent decline in their functional status and demonstrates the ability to make significant improvements in function in a reasonable and predictable amount of time.  Equipment Recommendations  Rolling walker (2 wheels)    Recommendations for Other Services       Precautions / Restrictions Precautions Precautions: Fall Restrictions Weight Bearing Restrictions: Yes RLE Weight Bearing: Weight bearing as tolerated      Mobility  Bed Mobility Overal bed mobility: Needs Assistance Bed Mobility: Supine to Sit     Supine to sit: Min assist;HOB elevated     General bed mobility comments: Assist to bring R LE off bed, he is able to scoot in sitting independently    Transfers Overall  transfer level: Needs assistance Equipment used: Rolling walker (2 wheels) Transfers: Sit to/from Stand Sit to Stand: Min guard;From elevated surface           General transfer comment: Elevated bed slightly, cues for hand placement with transfer    Ambulation/Gait Ambulation/Gait assistance: Min assist Gait Distance (Feet): 3 Feet Assistive device: Rolling walker (2 wheels) Gait Pattern/deviations: Step-to pattern;Decreased step length - right;Decreased step length - left;Decreased stride length Gait velocity: decr     General Gait Details: patient able to take a few steps from bed to recliner  Stairs            Wheelchair Mobility    Modified Rankin (Stroke Patients Only)       Balance Overall balance assessment: Needs assistance;History of Falls Sitting-balance support: Feet supported Sitting balance-Leahy Scale: Good     Standing balance support: Bilateral upper extremity supported;During functional activity;Reliant on assistive device for balance Standing balance-Leahy Scale: Fair                               Pertinent Vitals/Pain Pain Assessment: Faces Faces Pain Scale: Hurts a little bit Pain Location: R hip Pain Descriptors / Indicators: Discomfort Pain Intervention(s): Monitored during session;Limited activity within patient's tolerance;Repositioned;Ice applied    Home Living Family/patient expects to be discharged to:: Private residence Living Arrangements: Alone   Type of Home: Apartment Home Access: Level entry       Home Layout: One level Home Equipment: Cane - single point      Prior Function Prior Level of Function : Independent/Modified Independent;Driving  Mobility Comments: patient independent prior to admission. Helps to take care of his Aunt who is 48 ADLs Comments: independent     Hand Dominance        Extremity/Trunk Assessment   Upper Extremity Assessment Upper Extremity Assessment:  Defer to OT evaluation    Lower Extremity Assessment Lower Extremity Assessment: Generalized weakness;RLE deficits/detail RLE Sensation: WNL RLE Coordination: decreased gross motor    Cervical / Trunk Assessment Cervical / Trunk Assessment: Normal  Communication   Communication: No difficulties  Cognition Arousal/Alertness: Awake/alert Behavior During Therapy: WFL for tasks assessed/performed Overall Cognitive Status: Within Functional Limits for tasks assessed                                          General Comments      Exercises     Assessment/Plan    PT Assessment Patient needs continued PT services  PT Problem List Decreased strength;Decreased mobility;Decreased activity tolerance;Decreased balance;Pain;Decreased knowledge of use of DME;Decreased knowledge of precautions       PT Treatment Interventions DME instruction;Therapeutic activities;Therapeutic exercise;Gait training;Functional mobility training;Patient/family education;Balance training    PT Goals (Current goals can be found in the Care Plan section)  Acute Rehab PT Goals Patient Stated Goal: to return home PT Goal Formulation: With patient Time For Goal Achievement: 05/29/21 Potential to Achieve Goals: Fair    Frequency BID   Barriers to discharge Decreased caregiver support lives alone, may be able to get someone to stay with him    Co-evaluation               AM-PAC PT "6 Clicks" Mobility  Outcome Measure Help needed turning from your back to your side while in a flat bed without using bedrails?: A Little Help needed moving from lying on your back to sitting on the side of a flat bed without using bedrails?: A Little Help needed moving to and from a bed to a chair (including a wheelchair)?: A Little Help needed standing up from a chair using your arms (e.g., wheelchair or bedside chair)?: A Little Help needed to walk in hospital room?: A Lot Help needed climbing 3-5  steps with a railing? : A Lot 6 Click Score: 16    End of Session Equipment Utilized During Treatment: Gait belt Activity Tolerance: Patient tolerated treatment well Patient left: in chair;with call bell/phone within reach;with chair alarm set Nurse Communication: Mobility status PT Visit Diagnosis: Unsteadiness on feet (R26.81);Other abnormalities of gait and mobility (R26.89);Muscle weakness (generalized) (M62.81);Difficulty in walking, not elsewhere classified (R26.2);History of falling (Z91.81);Pain Pain - Right/Left: Right Pain - part of body: Hip    Time: 0902-0931 PT Time Calculation (min) (ACUTE ONLY): 29 min   Charges:   PT Evaluation $PT Eval Moderate Complexity: 1 Mod PT Treatments $Gait Training: 8-22 mins        [employer], PT, GCS 05/22/21,9:42 AM

## 2021-05-22 NOTE — TOC Initial Note (Signed)
Transition of Care [facility]) - Initial/Assessment Note    Patient Details  Name: Pedro Mccoy MRN: [id number] Date of Birth: 12-21-1950  Transition of Care [facility]) CM/SW Contact:    Candie Chroman, LCSW Phone Number: 05/22/2021, 10:50 AM  Clinical Narrative: Readmission prevention screen complete. This CSW working remote today. CSW called in the room, introduced role, and explained that discharge planning would be discussed. Based on chart review, PCP is Sharyn Creamer, NP at [facility] in [place]. Patient confirmed. Added this to his chart. Patient also mentioned a Dr. Micheal Likens but unable to find him on their website. Patient drives to his appointments. Pharmacy is [facility] in [place]. No issues obtaining medications. No home health prior to admission. Patient is agreeable to PT recommendation for home health. No agency preference. [place] has accepted referral. No DME use prior to admission. He is agreeable to DME recommendation for a RW. OT eval pending. Per RN, patient has prevena wound vac. No further concerns. CSW encouraged patient to contact CSW as needed. CSW will continue to follow patient for support and facilitate return home when stable.                 Expected Discharge Plan: [place] Barriers to Discharge: Continued Medical Work up   Patient Goals and CMS Choice     Choice offered to / list presented to : Patient  Expected Discharge Plan and Services Expected Discharge Plan: [place] Acute Care Choice: Durable Medical Equipment, Home Health Living arrangements for the past 2 months: Single Family Home                           HH Arranged: PT, OT [place] Agency: [place] ([place]) Date HH Agency Contacted: 05/22/21   Representative spoke with at [place]: Wylene Men  Prior Living Arrangements/Services Living arrangements for the past 2 months: [place] Lives with:: Self Patient  language and need for interpreter reviewed:: Yes Do you feel safe going back to the place where you live?: Yes      Need for Family Participation in Patient Care: Yes (Comment)     Criminal Activity/Legal Involvement Pertinent to Current Situation/Hospitalization: No - Comment as needed  Activities of Daily Living Home Assistive Devices/Equipment: Cane (specify quad or straight) ADL Screening (condition at time of admission) Patient's cognitive ability adequate to safely complete daily activities?: Yes Is the patient deaf or have difficulty hearing?: No Does the patient have difficulty seeing, even when wearing glasses/contacts?: No Does the patient have difficulty concentrating, remembering, or making decisions?: No Patient able to express need for assistance with ADLs?: Yes Does the patient have difficulty dressing or bathing?: No Independently performs ADLs?: Yes (appropriate for developmental age) Does the patient have difficulty walking or climbing stairs?: No Weakness of Legs: Both Weakness of Arms/Hands: None  Permission Sought/Granted Permission sought to share information with : Facility Art therapist granted to share information with : Yes, Verbal Permission Granted     Permission granted to share info w AGENCY: Home Health Agencies        Emotional Assessment Appearance:: Appears stated age Attitude/Demeanor/Rapport: Engaged, Gracious Affect (typically observed): Accepting, Appropriate, Calm, Pleasant Orientation: : Oriented to Self, Oriented to Place, Oriented to  Time, Oriented to Situation Alcohol / Substance Use: Not Applicable Psych Involvement: No (comment)  Admission diagnosis:  Surgery, elective [Z41.9] Fall [  W19.XXXA] Closed fracture of right hip, initial encounter ([place]) [S72.001A] Sepsis ([place]) [A41.9] Traumatic rhabdomyolysis, initial encounter ([place]) [T79.6XXA] Sepsis, due to unspecified organism, unspecified whether acute organ  dysfunction present [facility]) [A41.9] Patient Active Problem List   Diagnosis Date Noted   Sepsis ([place]) 05/20/2021   Fall at home, initial encounter 05/20/2021   Fracture of femoral neck, right, closed ([place]) 05/20/2021   Hyperglycemia 05/20/2021   Elevated CK 05/20/2021   Rhabdomyolysis 05/20/2021   Elevated troponin 05/20/2021   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   Altered mental state 09/10/2018   Schizophrenia ([place])    PCP:  Romualdo Bolk, FNP Pharmacy:   [facility] [place], [place] [facility] [address] AT [place] [facility] [place] [address] Phone: [phone] Fax: [phone]     Social Determinants of Health (SDOH) Interventions    Readmission Risk Interventions Readmission Risk Prevention Plan 05/22/2021  Transportation Screening Complete  PCP or Specialist Appt within 3-5 Days Complete  HRI or [place] Complete  Social Work Consult for [place] Planning/Counseling Complete  Palliative Care Screening Not Applicable  Medication Review Press photographer) Complete  Some recent data might be hidden

## 2021-05-22 NOTE — Progress Notes (Signed)
Physical Therapy Treatment Patient Details Name: Pedro Mccoy MRN: [id number] DOB: 1951/03/08 Today's Date: 05/22/2021   History of Present Illness Pavan Bring is a 70 y.o. male with medical history significant for hypertension, hyperlipidemia, non-insulin-dependent diabetes mellitus, schizophrenia, anxiety, depression, who presents to the emergency department from home for chief concerns of a fall. S/P R THA.    PT Comments    Patient received in recliner, just finished lunch and requesting to get back into bed. Patient requires cues for hand placement with transfers and min guard for transfers as well as ambulation of 25 feet in room with RW. Cues for safety and effective use of RW. Unsure of patient's comprehension of this. He required min assist to return to supine from sitting edge of bed. Patient will continue to benefit from skilled PT to improve functional independence and safety. I am hopeful that he will be able to progress and return home at discharge. Although he lives alone and I don't know if he'd have any assist.      Recommendations for follow up therapy are one component of a multi-disciplinary discharge planning process, led by the attending physician.  Recommendations may be updated based on patient status, additional functional criteria and insurance authorization.  Follow Up Recommendations  Home health PT     Assistance Recommended at Discharge Frequent or constant Supervision/Assistance  Equipment Recommendations  Rolling walker (2 wheels)    Recommendations for Other Services       Precautions / Restrictions Precautions Precautions: Fall;Anterior Hip Precaution Booklet Issued: No Restrictions Weight Bearing Restrictions: Yes RLE Weight Bearing: Weight bearing as tolerated     Mobility  Bed Mobility Overal bed mobility: Needs Assistance Bed Mobility: Sit to Supine       Sit to supine: Min assist   General bed mobility comments: min assist to bring R  LE up onto bed and to assist with positioning in bed    Transfers Overall transfer level: Needs assistance Equipment used: Rolling walker (2 wheels) Transfers: Sit to/from Stand Sit to Stand: Min guard           General transfer comment: Min guard, cues for hand placement, safety with transfers    Ambulation/Gait Ambulation/Gait assistance: Min guard Gait Distance (Feet): 25 Feet Assistive device: Rolling walker (2 wheels) Gait Pattern/deviations: Step-through pattern;Decreased step length - right;Decreased step length - left Gait velocity: decr     General Gait Details: Patient initially stating "I cant do this", but with encouragement patient able to ambulate to door and back to bed. Cues needed to use walker effectively and safely.   Stairs             Wheelchair Mobility    Modified Rankin (Stroke Patients Only)       Balance Overall balance assessment: Needs assistance;History of Falls Sitting-balance support: Feet supported Sitting balance-Leahy Scale: Good Sitting balance - Comments: good sitting balance reaching within BOS   Standing balance support: Bilateral upper extremity supported;During functional activity;Reliant on assistive device for balance Standing balance-Leahy Scale: Fair Standing balance comment: Requires min guard for ambulation with walker in room                            Cognition Arousal/Alertness: Awake/alert Behavior During Therapy: WFL for tasks assessed/performed Overall Cognitive Status: Within Functional Limits for tasks assessed  Exercises      General Comments        Pertinent Vitals/Pain Pain Assessment: Faces Faces Pain Scale: Hurts little more Pain Location: R hip Pain Descriptors / Indicators: Discomfort Pain Intervention(s): Monitored during session;Limited activity within patient's tolerance;Repositioned    Home Living Family/patient  expects to be discharged to:: Private residence Living Arrangements: Alone Available Help at Discharge: Family;Available PRN/intermittently (18 yo aunt) Type of Home: Apartment Home Access: Level entry       Home Layout: One level Home Equipment: Cane - single point;Grab bars - tub/shower      Prior Function            PT Goals (current goals can now be found in the care plan section) Acute Rehab PT Goals Patient Stated Goal: to return home PT Goal Formulation: With patient Time For Goal Achievement: 05/29/21 Potential to Achieve Goals: Fair Progress towards PT goals: Progressing toward goals    Frequency    BID      PT Plan Current plan remains appropriate    Co-evaluation              AM-PAC PT "6 Clicks" Mobility   Outcome Measure  Help needed turning from your back to your side while in a flat bed without using bedrails?: A Little Help needed moving from lying on your back to sitting on the side of a flat bed without using bedrails?: A Little Help needed moving to and from a bed to a chair (including a wheelchair)?: A Little Help needed standing up from a chair using your arms (e.g., wheelchair or bedside chair)?: A Little Help needed to walk in hospital room?: A Little Help needed climbing 3-5 steps with a railing? : A Lot 6 Click Score: 17    End of Session Equipment Utilized During Treatment: Gait belt Activity Tolerance: Patient limited by pain Patient left: in bed;with call bell/phone within reach;with bed alarm set;with SCD's reapplied Nurse Communication: Mobility status PT Visit Diagnosis: Unsteadiness on feet (R26.81);Other abnormalities of gait and mobility (R26.89);Muscle weakness (generalized) (M62.81);Difficulty in walking, not elsewhere classified (R26.2);History of falling (Z91.81);Pain Pain - Right/Left: Right Pain - part of body: Hip     Time: 1400-1419 PT Time Calculation (min) (ACUTE ONLY): 19 min  Charges:  $Gait Training:  8-22 mins                     [employer], PT, GCS 05/22/21,2:29 PM

## 2021-05-22 NOTE — Progress Notes (Signed)
[facility]    SUBJECTIVE: Patient postop day 1 from hip surgery.  Did have some rhabdo elevated white count possible sepsis underlying psychiatric history seems to been reasonably well no significant persistent cardiac history had elevated troponins in conjunction with elevated CPK from rhabdo no cardiac symptoms continue hydration   Vitals:   05/21/21 2312 05/22/21 0401 05/22/21 0718 05/22/21 1115  BP: 110/73 122/82 127/68 118/82  Pulse: 70 74 74 78  Resp: 18 18 18 18   Temp: 99.6 F (37.6 C) 98.6 F (37 C) 98.3 F (36.8 C) 98.2 F (36.8 C)  TempSrc:  Oral  Oral  SpO2: 96% 95% 97% 97%  Weight:      Height:         Intake/Output Summary (Last 24 hours) at 05/22/2021 1228 Last data filed at 05/22/2021 0930 Gross per 24 hour  Intake 3125.86 ml  Output 1725 ml  Net 1400.86 ml      PHYSICAL EXAM  General: Well developed, well nourished, in no acute distress HEENT:  Normocephalic and atramatic Neck:  No JVD.  Lungs: Clear bilaterally to auscultation and percussion. Heart: HRRR . Normal S1 and S2 without gallops or murmurs.  Abdomen: Bowel sounds are positive, abdomen soft and non-tender  Msk:  Back normal, normal gait. Normal strength and tone for age. Extremities: No clubbing, cyanosis or edema.   Neuro: Alert and oriented X 3. Psych:  Good affect, responds appropriately   LABS: Basic Metabolic Panel: Recent Labs    05/21/21 0431 05/22/21 0554  NA 136 135  K 3.7 3.8  CL 108 108  CO2 20* 21*  GLUCOSE 137* 149*  BUN 16 17  CREATININE 0.87 0.82  CALCIUM 8.4* 7.8*  MG 1.7 2.5*  PHOS 3.0  --    Liver Function Tests: Recent Labs    05/20/21 1524  AST 158*  ALT 43  ALKPHOS 58  BILITOT 2.7*  PROT 6.8  ALBUMIN 3.8   Recent Labs    05/20/21 1739  LIPASE 22   CBC: Recent Labs    05/20/21 1524 05/21/21 0431 05/22/21 0554  WBC 25.6* 19.3* 15.3*  NEUTROABS 23.0* 17.3*  --   HGB 16.1 14.4 11.5*  HCT 47.1 41.7 33.0*  MCV 86.1 85.5 85.5  PLT  200 171 152   Cardiac Enzymes: Recent Labs    05/20/21 1524 05/21/21 0431  CKTOTAL 9,666* 6,770*   BNP: Invalid input(s): POCBNP D-Dimer: No results for input(s): DDIMER in the last 72 hours. Hemoglobin A1C: No results for input(s): HGBA1C in the last 72 hours. Fasting Lipid Panel: No results for input(s): CHOL, HDL, LDLCALC, TRIG, CHOLHDL, LDLDIRECT in the last 72 hours. Thyroid Function Tests: No results for input(s): TSH, T4TOTAL, T3FREE, THYROIDAB in the last 72 hours.  Invalid input(s): FREET3 Anemia Panel: No results for input(s): VITAMINB12, FOLATE, FERRITIN, TIBC, IRON, RETICCTPCT in the last 72 hours.  DG Chest 1 View  Result Date: 05/20/2021 CLINICAL DATA:  Fall, found down EXAM: CHEST  1 VIEW COMPARISON:  11/14/2018 FINDINGS: Mild cardiomegaly. Both lungs are clear. The visualized skeletal structures are unremarkable. IMPRESSION: Mild cardiomegaly without acute abnormality of the lungs in AP portable projection. Electronically Signed   By: Delanna Ahmadi M.D.   On: 05/20/2021 16:48   CT Head Wo Contrast  Result Date: 05/20/2021 CLINICAL DATA:  Un witnessed fall with headaches, initial encounter EXAM: CT HEAD WITHOUT CONTRAST TECHNIQUE: Contiguous axial images were obtained from the base of the skull through the vertex without intravenous contrast. COMPARISON:  05/20/2021 FINDINGS: Brain: No evidence of acute infarction, hemorrhage, hydrocephalus, extra-axial collection or mass lesion/mass effect. Chronic atrophic and ischemic changes are noted stable in appearance from the prior exam. Vascular: No hyperdense vessel or unexpected calcification. Skull: Normal. Negative for fracture or focal lesion. Sinuses/Orbits: No acute finding. Other: None. IMPRESSION: Chronic atrophic and ischemic changes stable from the recent exam. No acute abnormality noted. Electronically Signed   By: Inez Catalina M.D.   On: 05/20/2021 21:30   CT Head Wo Contrast  Result Date: 05/20/2021 CLINICAL  DATA:  Head trauma, minor (Age >= 65y) EXAM: CT HEAD WITHOUT CONTRAST TECHNIQUE: Contiguous axial images were obtained from the base of the skull through the vertex without intravenous contrast. COMPARISON:  CT head 11/14/2018 FINDINGS: Brain: There is no acute intracranial hemorrhage, mass effect, or edema. Gray-white differentiation is preserved. There is no extra-axial fluid collection. Prominence of the ventricles and sulci reflects similar parenchymal volume loss. Minimal patchy low density in the supratentorial white matter is nonspecific but may reflect minor chronic microvascular ischemic changes. Vascular: No hyperdense vessel or unexpected calcification. Skull: Calvarium is unremarkable. Sinuses/Orbits: Mild mucosal thickening.  Orbits are unremarkable. Other: Right periorbital soft tissue swelling. Mastoid air cells are clear. IMPRESSION: No evidence of acute intracranial injury. Electronically Signed   By: Macy Mis M.D.   On: 05/20/2021 16:32   CT Cervical Spine Wo Contrast  Result Date: 05/20/2021 CLINICAL DATA:  Neck trauma (Age >= 65y) EXAM: CT CERVICAL SPINE WITHOUT CONTRAST TECHNIQUE: Multidetector CT imaging of the cervical spine was performed without intravenous contrast. Multiplanar CT image reconstructions were also generated. COMPARISON:  2020 FINDINGS: Alignment: Stable. Skull base and vertebrae: Stable vertebral body heights. No acute fracture. Soft tissues and spinal canal: No prevertebral fluid or swelling. No visible canal hematoma. Disc levels: Multilevel degenerative changes are present including disc space narrowing, endplate osteophytes, and facet and uncovertebral hypertrophy. Upper chest: No apical lung mass. A small nodule at the left lung apex was present in 2020 and is therefore benign. Other: Exophytic or adherent tissue within the right oropharynx (series 3, image 44) extending toward the piriformis sinus. IMPRESSION: No acute cervical spine fracture. Exophytic or  adherent tissue within the right oropharynx. While a mass is difficult to exclude, this is favored to represent small volume retained ingested material. Electronically Signed   By: Macy Mis M.D.   On: 05/20/2021 16:41   CT ABDOMEN PELVIS W CONTRAST  Result Date: 05/20/2021 CLINICAL DATA:  Abdominal pain, nonlocalized EXAM: CT ABDOMEN AND PELVIS WITH CONTRAST TECHNIQUE: Multidetector CT imaging of the abdomen and pelvis was performed using the standard protocol following bolus administration of intravenous contrast. CONTRAST:  145mL OMNIPAQUE IOHEXOL 300 MG/ML  SOLN COMPARISON:  None. FINDINGS: Lower chest: No focal pulmonary opacity or pleural effusion. No pericardial effusion. Hepatobiliary: Hepatic steatosis. No focal liver abnormality. The hepatic and portal veins are patent. No intra or extrahepatic biliary ductal dilatation. A Phrygian cap is noted on an otherwise normal gallbladder. No gallstones or gallbladder wall thickening. Pancreas: In the body of the pancreas, there is a 2.0 x 2.7 x 2.0 cm mass (series 2, image 20 and series 5, image 39), this central low density and mild adjacent stranding. Pancreas is otherwise unremarkable. Spleen: Normal in size without focal abnormality. Adrenals/Urinary Tract: The adrenal glands are unremarkable. The kidneys enhance symmetrically with no hydronephrosis. Multiple low-attenuation lesions in the right greater than left kidney, which are too small to characterize but most likely renal cysts. The bladder  contains a Foley. Apparent wall thickening is likely secondary to under distension. Stomach/Bowel: Stomach is within normal limits. Appendix appears normal. No evidence of bowel wall thickening, distention, or inflammatory changes. Diverticulosis without evidence of diverticulitis. Vascular/Lymphatic: Aortic atherosclerosis. No enlarged abdominal or pelvic lymph nodes. Reproductive: Prostatomegaly. Other: No abdominal wall hernia or abnormality. No  abdominopelvic ascites. Musculoskeletal: Right subcapital femoral neck fracture (series 2, image 78), moderately impacted. Degenerative changes in the lumbar spine with approximately 40% height loss in the anterior aspect of L1, which appears chronic. Bilateral L5-S1 assimilation joints. IMPRESSION: 1. 2.7 cm mass in the body of the pancreas, with central low density, which may represent a neoplasm or focal collection. Adjacent surrounding suggest inflammatory process. This may represent neoplasm with superimposed acute pancreatitis. Correlate with lipase. An MRI pancreatic protocol is recommended. 2. Right subcapital femoral neck fracture, moderately impacted, as seen on the same-day radiograph. 3. Chronic appearing compression deformity of L1, although no prior spine imaging is available. Correlate with point tenderness. If there is concern that this fracture may be acute, consider MRI. 4. Wall thickening in the bladder, which may be secondary to underdistension, given the presence of a Foley. These results were called by telephone at the time of interpretation on 05/20/2021 at 7:40 pm to provider AMY COX , who verbally acknowledged these results. Electronically Signed   By: Merilyn Baba M.D.   On: 05/20/2021 19:44   DG C-Arm 1-60 Min-No Report  Result Date: 05/21/2021 Fluoroscopy was utilized by the requesting physician.  No radiographic interpretation.   ECHOCARDIOGRAM COMPLETE  Result Date: 05/21/2021    ECHOCARDIOGRAM REPORT   Patient Name:   Pedro Mccoy Date of Exam: 05/21/2021 Medical Rec #:  [id number]    Height:       73.0 in Accession #:    [id number]   Weight:       210.0 lb Date of Birth:  1950/12/09    BSA:          2.197 m Patient Age:    71 years     BP:           139/84 mmHg Patient Gender: M            HR:           77 bpm. Exam Location:  [facility] Procedure: 2D Echo, Color Doppler, Cardiac Doppler and Intracardiac            Opacification Agent Indications:     I21.4 NSTEMI  History:          Patient has no prior history of Echocardiogram examinations.                  Risk Factors:Hypertension.  Sonographer:     Charmayne Sheer Referring Phys:  [id number] AMY N COX Diagnosing Phys: Nelva Bush MD  Sonographer Comments: Suboptimal subcostal window. Image acquisition challenging due to respiratory motion. Global longitudinal strain was attempted. IMPRESSIONS  1. Left ventricular ejection fraction, by estimation, is 65 to 70%. The left ventricle has normal function. Left ventricular endocardial border not optimally defined to evaluate regional wall motion. There is mild left ventricular hypertrophy. Left ventricular diastolic parameters were normal.  2. Right ventricular systolic function is normal. The right ventricular size is normal.  3. The mitral valve is grossly normal. No evidence of mitral valve regurgitation. No evidence of mitral stenosis.  4. The aortic valve is tricuspid. Aortic valve regurgitation is not visualized. No aortic stenosis is present. FINDINGS  Left  Ventricle: Left ventricular ejection fraction, by estimation, is 65 to 70%. The left ventricle has normal function. Left ventricular endocardial border not optimally defined to evaluate regional wall motion. Definity contrast agent was given IV to delineate the left ventricular endocardial borders. Global longitudinal strain performed but not reported based on interpreter judgement due to suboptimal tracking. The left ventricular internal cavity size was normal in size. There is mild left ventricular hypertrophy. Left ventricular diastolic parameters were normal. Right Ventricle: The right ventricular size is normal. No increase in right ventricular wall thickness. Right ventricular systolic function is normal. Left Atrium: Left atrial size was normal in size. Right Atrium: Right atrial size was normal in size. Pericardium: The pericardium was not well visualized. Mitral Valve: The mitral valve is grossly normal. There is mild thickening  of the mitral valve leaflet(s). There is mild calcification of the mitral valve leaflet(s). No evidence of mitral valve regurgitation. No evidence of mitral valve stenosis. MV peak gradient, 5.7 mmHg. The mean mitral valve gradient is 2.0 mmHg. Tricuspid Valve: The tricuspid valve is normal in structure. Tricuspid valve regurgitation is not demonstrated. Aortic Valve: The aortic valve is tricuspid. Aortic valve regurgitation is not visualized. No aortic stenosis is present. Aortic valve mean gradient measures 6.0 mmHg. Aortic valve peak gradient measures 11.3 mmHg. Aortic valve area, by VTI measures 4.09  cm. Pulmonic Valve: The pulmonic valve was not well visualized. Pulmonic valve regurgitation is not visualized. No evidence of pulmonic stenosis. Aorta: The aortic root is normal in size and structure. Pulmonary Artery: The pulmonary artery is not well seen. Venous: The inferior vena cava was not well visualized. IAS/Shunts: The interatrial septum was not well visualized.  LEFT VENTRICLE PLAX 2D LVIDd:         3.80 cm   Diastology LVIDs:         2.96 cm   LV e' medial:    8.49 cm/s LV PW:         1.28 cm   LV E/e' medial:  9.4 LV IVS:        1.07 cm   LV e' lateral:   11.40 cm/s LVOT diam:     2.30 cm   LV E/e' lateral: 7.0 LV SV:         126 LV SV Index:   57 LVOT Area:     4.15 cm  RIGHT VENTRICLE RV Basal diam:  3.26 cm RV S prime:     14.80 cm/s LEFT ATRIUM             Index        RIGHT ATRIUM           Index LA diam:        5.00 cm 2.28 cm/m   RA Area:     16.40 cm LA Vol (A2C):   52.3 ml 23.81 ml/m  RA Volume:   44.80 ml  20.39 ml/m LA Vol (A4C):   64.7 ml 29.45 ml/m LA Biplane Vol: 62.9 ml 28.63 ml/m  AORTIC VALVE                     PULMONIC VALVE AV Area (Vmax):    3.61 cm      PV Vmax:       0.89 m/s AV Area (Vmean):   3.85 cm      PV Vmean:      56.700 cm/s AV Area (VTI):     4.09 cm  PV VTI:        0.160 m AV Vmax:           168.00 cm/s   PV Peak grad:  3.2 mmHg AV Vmean:           110.000 cm/s  PV Mean grad:  1.0 mmHg AV VTI:            0.309 m AV Peak Grad:      11.3 mmHg AV Mean Grad:      6.0 mmHg LVOT Vmax:         146.00 cm/s LVOT Vmean:        102.000 cm/s LVOT VTI:          0.304 m LVOT/AV VTI ratio: 0.98  AORTA Ao Root diam: 3.50 cm MITRAL VALVE MV Area (PHT): 3.19 cm     SHUNTS MV Area VTI:   4.18 cm     Systemic VTI:  0.30 m MV Peak grad:  5.7 mmHg     Systemic Diam: 2.30 cm MV Mean grad:  2.0 mmHg MV Vmax:       1.19 m/s MV Vmean:      75.0 cm/s MV Decel Time: 238 msec MV E velocity: 79.70 cm/s MV A velocity: 108.00 cm/s MV E/A ratio:  0.74 Christopher End MD Electronically signed by Nelva Bush MD Signature Date/Time: 05/21/2021/11:05:05 AM    Final    DG HIP UNILAT WITH PELVIS 1V RIGHT  Result Date: 05/21/2021 CLINICAL DATA:  Right hip arthroplasty EXAM: DG HIP (WITH OR WITHOUT PELVIS) 1V RIGHT COMPARISON:  Pelvis radiographs dated 1 day prior FINDINGS: Three C-arm fluoroscopic images were obtained intraoperatively and submitted for post operative interpretation. The initial image demonstrates a right femoral neck fracture in improved alignment compared to the pelvis radiographs obtained 1 day prior. Subsequent images demonstrate postsurgical changes reflecting right hip arthroplasty. Hardware alignment is within expected limits, without evidence of complication. Fluoro time 14 seconds. Please see the performing provider's procedural report for further detail. IMPRESSION: Status post right hip arthroplasty without evidence of complication. Electronically Signed   By: Valetta Mole M.D.   On: 05/21/2021 14:17   DG HIP UNILAT W OR W/O PELVIS 2-3 VIEWS RIGHT  Result Date: 05/21/2021 CLINICAL DATA:  Status post right hip replacement. EXAM: DG HIP (WITH OR WITHOUT PELVIS) 2-3V RIGHT COMPARISON:  Fluoroscopic images same day. FINDINGS: Right acetabular and femoral components are well situated. Expected postoperative changes seen in the surrounding soft tissues.  IMPRESSION: Status post right total hip arthroplasty. Electronically Signed   By: Marijo Conception M.D.   On: 05/21/2021 15:26   DG Hip Unilat W or Wo Pelvis 2-3 Views Right  Result Date: 05/20/2021 CLINICAL DATA:  Right hip pain after fall, abrasions EXAM: DG HIP (WITH OR WITHOUT PELVIS) 2-3V RIGHT COMPARISON:  None. FINDINGS: Frontal view of the pelvis as well as frontal and cross-table lateral views of the right hip are obtained. There is an impacted subcapital right femoral neck fracture. No evidence of dislocation. Left hip is unremarkable. Remainder of the bony pelvis is normal. IMPRESSION: 1. Impacted subcapital right femoral neck fracture. Electronically Signed   By: Randa Ngo M.D.   On: 05/20/2021 16:47     Echo normal left ventricular function EF around 65%  TELEMETRY: Normal sinus rhythm rate of 60:  ASSESSMENT AND PLAN:  Principal Problem:   Sepsis ([place]) Active Problems:   Altered mental state   Schizophrenia ([place])   Fall at home, initial  encounter   Fracture of femoral neck, right, closed (HCC)   Hyperglycemia   Elevated CK   Rhabdomyolysis   Elevated troponin   Pancreatic mass    Plan Postop hip surgery continue current management Maintain hydration because of rhabdo Will discontinue heparin from what appears to be a non-STEMI now has some anemia Consider transitioning to Plavix daily Do not recommend cardiac cath at this stage Stable chronic underlying psychiatric history Recommend medical therapy   Yolonda Kida, MD 05/22/2021 12:28 PM

## 2021-05-22 NOTE — Evaluation (Signed)
Occupational Therapy Evaluation Patient Details Name: Pedro Mccoy MRN: [id number] DOB: 1950-08-25 Today's Date: 05/22/2021   History of Present Illness Pedro Mccoy is a 70 y.o. male with medical history significant for hypertension, hyperlipidemia, non-insulin-dependent diabetes mellitus, schizophrenia, anxiety, depression, who presents to the emergency department from home for chief concerns of a fall. S/P R THA.   Clinical Impression   Pt seen for OT evaluation this date, POD#1 from above surgery. Upon arrival to room, pt awake and sitting upright in recliner. Pt agreeable to OT eval/tx. Pt was independent in all ADLs, living alone in a 1-level apartment prior to admission. Pt currently requires MOD A for LB dressing with reacher/sock aide, MIN A for BSC transfers, and MIN GUARD for functional mobility of short household distances (88ft) with RW due to R hip pain and decreased balance. Pt instructed in falls prevention strategies, home/routines modifications, and DME/AE for LB bathing/dressing tasks. Handout provided and pt verbalized understanding, however pt required multi-modal cues to return demo education provided regarding AE for LB dressing. Pt would benefit from skilled OT services including additional instruction in techniques with or without assistive devices for dressing and bathing skills to support recall and carryover prior to discharge and ultimately to maximize safety, independence, and minimize falls risk and caregiver burden. Due to lack of caregiver support and difficulty performing toilet transfers and LB ADLs without physical assist, recommend SNF following this hospitalization.        Recommendations for follow up therapy are one component of a multi-disciplinary discharge planning process, led by the attending physician.  Recommendations may be updated based on patient status, additional functional criteria and insurance authorization.   Follow Up Recommendations  Skilled  nursing-short term rehab (<3 hours/day)    Assistance Recommended at Discharge Frequent or constant Supervision/Assistance  Functional Status Assessment  Patient has had a recent decline in their functional status and demonstrates the ability to make significant improvements in function in a reasonable and predictable amount of time.  Equipment Recommendations  Other (comment) (defer to next venue of care)       Precautions / Restrictions Precautions Precautions: Fall;Anterior Hip Restrictions Weight Bearing Restrictions: Yes RLE Weight Bearing: Weight bearing as tolerated      Mobility Bed Mobility Overal bed mobility: Needs Assistance        General bed mobility comments: not assessed, pt in recliner at beginning/end of session    Transfers Overall transfer level: Needs assistance Equipment used: Rolling walker (2 wheels) Transfers: Sit to/from Stand Sit to Stand: Min guard;Min assist           General transfer comment: MIN GUARD from recliner, MIN A from [facility]. Requires verbal and tactile cues for body positining during transfer      Balance Overall balance assessment: Needs assistance;History of Falls Sitting-balance support: Feet supported;No upper extremity supported Sitting balance-Leahy Scale: Good Sitting balance - Comments: good sitting balance reaching within BOS   Standing balance support: Bilateral upper extremity supported;During functional activity;Reliant on assistive device for balance Standing balance-Leahy Scale: Poor Standing balance comment: Requires CGA for functional mobility of short household distances with RW                           ADL either performed or assessed with clinical judgement   ADL Overall ADL's : Needs assistance/impaired                     Lower Body Dressing:  Moderate assistance;With adaptive equipment;Sitting/lateral leans Lower Body Dressing Details (indicate cue type and reason): Following  education on reacher and sock aide use for LB dressing, pt required MOD A to don/doff socks with AD Toilet Transfer: Minimal assistance;Ambulation;BSC/3in1;Rolling walker (2 wheels) Toilet Transfer Details (indicate cue type and reason): Pt requires verbal & tactile cues for sequencing transfer with RW and MIN A for steadying         Functional mobility during ADLs: Min guard;Rolling walker (2 wheels) (to walk 66ft)        Pertinent Vitals/Pain Pain Assessment: 0-10 Pain Score: 2  Faces Pain Scale: Hurts a little bit Pain Location: R hip Pain Descriptors / Indicators: Discomfort Pain Intervention(s): Limited activity within patient's tolerance;Monitored during session;Ice applied        Extremity/Trunk Assessment Upper Extremity Assessment Upper Extremity Assessment: Generalized weakness   Lower Extremity Assessment Lower Extremity Assessment: Generalized weakness;RLE deficits/detail RLE Deficits / Details: s/p THA RLE Sensation: WNL RLE Coordination: decreased gross motor   Cervical / Trunk Assessment Cervical / Trunk Assessment: Normal   Communication Communication Communication: No difficulties   Cognition Arousal/Alertness: Awake/alert Behavior During Therapy: WFL for tasks assessed/performed Overall Cognitive Status: Within Functional Limits for tasks assessed                                          Exercises          Home Living Family/patient expects to be discharged to:: Private residence Living Arrangements: Alone Available Help at Discharge: Family;Available PRN/intermittently (42 yo aunt) Type of Home: Apartment Home Access: Level entry     Home Layout: One level     Bathroom Shower/Tub: [employer] aid/development worker Accessibility: Yes   Home Equipment: Cane - single point;Grab bars - tub/shower          Prior Functioning/Environment Prior Level of Function : Independent/Modified Independent;Driving              Mobility Comments: patient independent with functional mobility prior to admission. ADLs Comments: independent with ADLs. Helps to take care of his Aunt who is 84 yo        OT Problem List: Decreased strength;Decreased activity tolerance;Decreased range of motion;Impaired balance (sitting and/or standing);Pain;Decreased knowledge of use of DME or AE      OT Treatment/Interventions: Self-care/ADL training;Therapeutic exercise;DME and/or AE instruction;Therapeutic activities;Patient/family education;Balance training    OT Goals(Current goals can be found in the care plan section) Acute Rehab OT Goals Patient Stated Goal: to return home safely OT Goal Formulation: With patient Time For Goal Achievement: 06/05/21 Potential to Achieve Goals: Fair ADL Goals Pt Will Perform Grooming: with set-up;with supervision;standing Pt Will Perform Lower Body Dressing: with min guard assist;sit to/from stand Pt Will Transfer to Toilet: with supervision;ambulating;bedside commode  OT Frequency: Min 2X/week    AM-PAC OT "6 Clicks" Daily Activity     Outcome Measure Help from another person eating meals?: None Help from another person taking care of personal grooming?: A Little Help from another person toileting, which includes using toliet, bedpan, or urinal?: A Little Help from another person bathing (including washing, rinsing, drying)?: A Lot Help from another person to put on and taking off regular upper body clothing?: A Little Help from another person to put on and taking off regular lower body clothing?: A Lot 6 Click Score: 17   End of Session Equipment Utilized  During Treatment: Rolling walker (2 wheels) Nurse Communication: Mobility status  Activity Tolerance: Patient tolerated treatment well Patient left: in chair;with call bell/phone within reach;with chair alarm set  OT Visit Diagnosis: Unsteadiness on feet (R26.81);History of falling (Z91.81)                Time: 4591-3685 OT Time  Calculation (min): 27 min Charges:  OT General Charges $OT Visit: 1 Visit OT Treatments $Self Care/Home Management : 8-22 mins  Fredirick Maudlin, OTR/L [place]

## 2021-05-22 NOTE — Progress Notes (Signed)
   Subjective: 1 Day Post-Op Procedure(s) (LRB): ANTERIOR HIP TOTAL ARTHROPLASTY (Right) Patient reports pain as mild.   Patient is well, and has had no acute complaints or problems Denies any CP, SOB, ABD pain.  Objective: Vital signs in last 24 hours: Temp:  [97.6 F (36.4 C)-99.6 F (37.6 C)] 98.2 F (36.8 C) (11/23 1115) Pulse Rate:  [67-80] 78 (11/23 1115) Resp:  [14-21] 18 (11/23 1115) BP: (110-144)/(66-86) 118/82 (11/23 1115) SpO2:  [94 %-99 %] 97 % (11/23 1115)  Intake/Output from previous day: 11/22 0701 - 11/23 0700 In: 2885.9 [P.O.:480; I.V.:1415.7; IV Piggyback:990.2] Out: 775 [Urine:750; Blood:25] Intake/Output this shift: Total I/O In: 240 [P.O.:240] Out: 1300 [Urine:1300]  Recent Labs    05/20/21 1524 05/21/21 0431 05/22/21 0554  HGB 16.1 14.4 11.5*   Recent Labs    05/21/21 0431 05/22/21 0554  WBC 19.3* 15.3*  RBC 4.88 3.86*  HCT 41.7 33.0*  PLT 171 152   Recent Labs    05/21/21 0431 05/22/21 0554  NA 136 135  K 3.7 3.8  CL 108 108  CO2 20* 21*  BUN 16 17  CREATININE 0.87 0.82  GLUCOSE 137* 149*  CALCIUM 8.4* 7.8*   Recent Labs    05/20/21 1739 05/21/21 0431  INR 1.4* 1.5*    EXAM General - Patient is Alert, Appropriate, and Oriented Extremity - Neurovascular intact Sensation intact distally Intact pulses distally Dorsiflexion/Plantar flexion intact Dressing - dressing C/D/I and no drainage, prevena intact with out drainage Motor Function - intact, moving foot and toes well on exam.   Past Medical History:  Diagnosis Date   Hypertension    Hypertriglyceridemia .    Assessment/Plan:   1 Day Post-Op Procedure(s) (LRB): ANTERIOR HIP TOTAL ARTHROPLASTY (Right) Principal Problem:   Sepsis ([place]) Active Problems:   Altered mental state   Schizophrenia ([place])   Fall at home, initial encounter   Fracture of femoral neck, right, closed (HCC)   Hyperglycemia   Elevated CK   Rhabdomyolysis   Elevated troponin   Pancreatic  mass  Estimated body mass index is 24.8 kg/m as calculated from the following:   Height as of this encounter: 6\' 1"  (1.854 m).   Weight as of this encounter: 85.3 kg. Advance diet Up with therapy Pain controlled Labs and VSS CM to assist with discharge  Follow up with [place] [facility] in 2 weeks Please remove provena negative pressure dressing on 06/01/2021 and apply honey comb dressing. Keep dressing clean and dry at all times.    DVT Prophylaxis - TED hose and Heparin Weight-Bearing as tolerated to right leg   T. Rachelle Hora, PA-C [place] 05/22/2021, 12:38 PM

## 2021-05-22 NOTE — Progress Notes (Signed)
  Progress Note    Pedro Mccoy   NLZ:[id number]  DOB: 09-Dec-1950  DOA: 05/20/2021     2 Date of Service: 05/22/2021   Clinical Course Past medical history of HTN, HLD, NIDDM, schizophrenia, anxiety, depression, tremors.  Presents with complaints of mechanical fall.  Found to have hip fracture as well as rhabdomyolysis. Underwent hip surgery.  Cardiology and orthopedics following.  Assessment and Plan Right femoral neck impacted subcapital fracture. Present on admission. Mechanical fall. Prior history of recurrent fall. Appreciate consultation from Dr. Youlanda Mighty. Underwent anterior total hip arthroplasty right. Currently on therapeutic anticoagulation. Weightbearing and PT OT.  Orthopedics pain control per orthopedics.  Nontraumatic rhabdomyolysis. Patient had a mechanical fall.  Patient was on the ground for 3 hours after the fall. CK elevated. Rhabdomyolysis likely from the hypotension treated with IV fluids.  Elevated troponin. Possible demand ischemia. I do not think the patient actually has non-STEMI. EKG unremarkable. Started on heparin drip.  Heparin now stopped. Patient cardiology consultation. Echocardiogram shows preserved EF. Likely troponin elevation secondary to rhabdomyolysis with CK elevation.  Consult for SIRS.  Sepsis ruled out. Heart rate elevated at the time of admission with leukocytosis and lactic acidosis. Suspect this is actually his been secondary to fall and trauma rather than sepsis. Currently on broad-spectrum antibiotic. Will monitor.  Staph epi bacteremia 1 out of 4 blood culture bottles positive for staph epi. Suspect this is more likely contamination rather than an actual infection. Will repeat the culture. Most likely given lack of concern for clinical infection we will stop antibiotic tomorrow.  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  Concerning for neoplasm or focal collection. Will require an MRI  pancreatic protocol once medically stable.  Type 2 diabetes mellitus, without any long-term insulin use.  Controlled. Metformin currently on hold. Will continue sliding scale insulin.  Hyperlipidemia. Continue Crestor.  History of schizophrenia pretension patient is on Haldol, risperidone and lamotrigine. Will continue.  Depression. On fluoxetine furosemide. On Ativan as well.  We will continue.  Multiple bruising and scratch marks. Present on admission. Monitor.  L1 compression fracture. Chronic appearing. No pain. Outpatient follow-up with PT OT.  Subjective:  No nausea no vomiting no fever no chills.  Pain well controlled.  Objective Vitals:   05/22/21 0718 05/22/21 1115 05/22/21 1545 05/22/21 1949  BP: 127/68 118/82 128/76 135/78  Pulse: 74 78 60 65  Resp: 18 18 17 20   Temp: 98.3 F (36.8 C) 98.2 F (36.8 C) 98.3 F (36.8 C) 99.2 F (37.3 C)  TempSrc:  Oral    SpO2: 97% 97% 99% 97%  Weight:      Height:       85.3 kg  Exam General: Appear in mild distress, no Rash; Oral Mucosa Clear, moist. no Abnormal Neck Mass Or lumps, Conjunctiva normal  Cardiovascular: S1 and S2 Present, no Murmur, Respiratory: good respiratory effort, Bilateral Air entry present and CTA, no Crackles, no wheezes Abdomen: Bowel Sound present, Soft and no tenderness Extremities: no Pedal edema Neurology: alert and oriented to time, place, and person, resting tremor present. affect appropriate. no new focal deficit Gait not checked due to patient safety concerns    Labs / Other Information Hemoglobin dropped from 13-11.  Other labs stable.   Disposition Plan: Status is: Inpatient  Remains inpatient appropriate because: Requiring close monitoring of H&H.  May require transfusion.  Time spent: 35 minutes [facility] 05/22/2021, 8:04 PM

## 2021-05-22 NOTE — Consult Note (Signed)
[place] for IV Heparin Indication: chest pain/ACS  Patient Measurements: Height: 6\' 1"  (185.4 cm) Weight: 85.3 kg (188 lb) IBW/kg (Calculated) : 79.9 Heparin Dosing Weight: 85.3 kg  Labs: Recent Labs    05/20/21 1524 05/20/21 1739 05/20/21 2300 05/20/21 2355 05/21/21 0431 05/22/21 0554  HGB 16.1  --   --   --  14.4 11.5*  HCT 47.1  --   --   --  41.7 33.0*  PLT 200  --   --   --  171 152  APTT  --   --   --  85*  --   --   LABPROT  --  16.7*  --   --  17.6*  --   INR  --  1.4*  --   --  1.5*  --   HEPARINUNFRC  --   --   --   --  0.12* <0.10*  CREATININE 1.22  --   --   --  0.87 0.82  CKTOTAL 9,666*  --   --   --  6,770*  --   TROPONINIHS 738* 1,141* 1,163*  --   --   --      Estimated Creatinine Clearance: 94.7 mL/min (by C-G formula based on SCr of 0.82 mg/dL).   Medical History: Past Medical History:  Diagnosis Date   Hypertension    Hypertriglyceridemia .    Medications:  No anticoagulation prior to admission per my chart review  Assessment: Patient is a 70 y/o M with medical history as above who was BIBEMS from aunt's house with falls and disorientation. Patient being admitted with sepsis. Labs significant for elevated troponin, CK, WBC, lactic acidosis. Pharmacy consulted to initiate and manage heparin infusion for suspected ACS.  11/23 0554 HL <0.10, subtherapeutic  Goal of Therapy:  Heparin level 0.3-0.7 units/ml Monitor platelets by anticoagulation protocol: Yes   Plan:  HL subtherapeutic, will bolus 2600 and increase heparin infusion rate to 1850 units/hr Will recheck HL 6 hrs after rate change CBC daily while on heparin infusion   Sherilyn Banker, PharmD Clinical Pharmacist 05/22/2021 8:52 AM

## 2021-05-23 LAB — BASIC METABOLIC PANEL
Anion gap: 5 (ref 5–15)
BUN: 14 mg/dL (ref 8–23)
CO2: 22 mmol/L (ref 22–32)
Calcium: 7.6 mg/dL — ABNORMAL LOW (ref 8.9–10.3)
Chloride: 110 mmol/L (ref 98–111)
Creatinine, Ser: 0.7 mg/dL (ref 0.61–1.24)
GFR, Estimated: >60 mL/min (ref >60)
Glucose, Bld: 119 mg/dL — ABNORMAL HIGH (ref 70–99)
Potassium: 3.4 mmol/L — ABNORMAL LOW (ref 3.5–5.1)
Sodium: 137 mmol/L (ref 135–145)

## 2021-05-23 LAB — CBC
HCT: 30.9 % — ABNORMAL LOW (ref 39.0–52.0)
Hemoglobin: 11.4 g/dL — ABNORMAL LOW (ref 13.0–17.0)
MCH: 31.6 pg (ref 26.0–34.0)
MCHC: 36.9 g/dL — ABNORMAL HIGH (ref 30.0–36.0)
MCV: 85.6 fL (ref 80.0–100.0)
Platelets: 169 10*3/uL (ref 150–400)
RBC: 3.61 MIL/uL — ABNORMAL LOW (ref 4.22–5.81)
RDW: 13.5 % (ref 11.5–15.5)
WBC: 9.5 10*3/uL (ref 4.0–10.5)
nRBC: 0 % (ref 0.0–0.2)

## 2021-05-23 LAB — MAGNESIUM: Magnesium: 2.1 mg/dL (ref 1.7–2.4)

## 2021-05-23 LAB — CULTURE, BLOOD (ROUTINE X 2): Culture  Setup Time: NONE SEEN

## 2021-05-23 MED ORDER — DOCUSATE SODIUM 100 MG PO CAPS: 100.0000 mg | ORAL_CAPSULE | Freq: Two times a day (BID) | ORAL | 0 refills | Status: DC

## 2021-05-23 MED ORDER — ENOXAPARIN SODIUM 40 MG/0.4ML IJ SOSY: 40.0000 mg | PREFILLED_SYRINGE | INTRAMUSCULAR | Status: DC

## 2021-05-23 MED ORDER — SODIUM CHLORIDE 0.9 % IV SOLN: INTRAVENOUS | Status: DC

## 2021-05-23 MED ORDER — DOCUSATE SODIUM 100 MG PO CAPS: 100.0000 mg | ORAL_CAPSULE | Freq: Two times a day (BID) | ORAL | 0 refills | Status: AC

## 2021-05-23 MED ORDER — POTASSIUM CHLORIDE CRYS ER 20 MEQ PO TBCR
40.0000 meq | EXTENDED_RELEASE_TABLET | Freq: Once | ORAL | Status: AC
Start: 2021-05-23 — End: 2021-05-23
  Administered 2021-05-23: 40 meq via ORAL
  Filled 2021-05-23: qty 4

## 2021-05-23 MED ORDER — APIXABAN 2.5 MG PO TABS: 2.5000 mg | ORAL_TABLET | Freq: Two times a day (BID) | ORAL | 0 refills | Status: AC

## 2021-05-23 MED ORDER — ENOXAPARIN SODIUM 40 MG/0.4ML IJ SOSY: 40.0000 mg | PREFILLED_SYRINGE | INTRAMUSCULAR | 0 refills | Status: DC

## 2021-05-23 MED ORDER — APIXABAN 2.5 MG PO TABS: 2.5000 mg | ORAL_TABLET | Freq: Two times a day (BID) | ORAL | 0 refills | Status: DC

## 2021-05-23 NOTE — Progress Notes (Signed)
Physical Therapy Treatment Patient Details Name: Pedro Mccoy MRN: [id number] DOB: [birth year] Today's Date: 05/23/2021   History of Present Illness Pedro Mccoy is a 70 y.o. male with medical history significant for hypertension, hyperlipidemia, non-insulin-dependent diabetes mellitus, schizophrenia, anxiety, depression, who presents to the emergency department from home for chief concerns of a fall. S/P R THA.    PT Comments    Pt was long sitting in bed upon arriving. He agrees to PT session and is cooperative and motivated throughout. Required assistance to exit L side of bed however once sitting up was able to stand and ambulate with RW with CGA only. No LOB or safety concerns during all standing activity. Recommend DC home with HHPT to follow. Acute PT will continue to follow and progress as able per current POC.    Recommendations for follow up therapy are one component of a multi-disciplinary discharge planning process, led by the attending physician.  Recommendations may be updated based on patient status, additional functional criteria and insurance authorization.  Follow Up Recommendations  Home health PT     Assistance Recommended at Discharge Intermittent Supervision/Assistance  Equipment Recommendations  Other (comment) (defer to next level of care)       Precautions / Restrictions Precautions Precautions: Fall;Anterior Hip Precaution Booklet Issued: No Restrictions Weight Bearing Restrictions: Yes RLE Weight Bearing: Weight bearing as tolerated     Mobility  Bed Mobility Overal bed mobility: Needs Assistance Bed Mobility: Supine to Sit     Supine to sit: Min assist;Mod assist     General bed mobility comments: pt required moderate assistance to exit L sid eof bed with vcs for technique and sequencing    Transfers Overall transfer level: Needs assistance Equipment used: Rolling walker (2 wheels) Transfers: Sit to/from Stand Sit to Stand: Min guard      Ambulation/Gait Ambulation/Gait assistance: Min guard Gait Distance (Feet): 200 Feet Assistive device: Rolling walker (2 wheels) Gait Pattern/deviations: Step-through pattern;Decreased step length - right;Decreased step length - left Gait velocity: decreased     General Gait Details: Pt demonstrated safe steady gait kinematics but does endorse fatigue after ambulation ~ 200 ft. reliant on RW for safety     Balance Overall balance assessment: Needs assistance;History of Falls Sitting-balance support: Feet supported Sitting balance-Leahy Scale: Good     Standing balance support: Bilateral upper extremity supported;During functional activity;Reliant on assistive device for balance Standing balance-Leahy Scale: Fair Standing balance comment: Requires min guard for ambulation with walker in room        Cognition Arousal/Alertness: Awake/alert Behavior During Therapy: WFL for tasks assessed/performed Overall Cognitive Status: Within Functional Limits for tasks assessed    General Comments: Pt is A and O x 4               Pertinent Vitals/Pain Pain Assessment: No/denies pain Pain Score: 0-No pain     PT Goals (current goals can now be found in the care plan section) Acute Rehab PT Goals Patient Stated Goal: to return home Progress towards PT goals: Progressing toward goals    Frequency    BID      PT Plan Current plan remains appropriate       AM-PAC PT "6 Clicks" Mobility   Outcome Measure  Help needed turning from your back to your side while in a flat bed without using bedrails?: A Little Help needed moving from lying on your back to sitting on the side of a flat bed without using bedrails?: A Little Help needed  moving to and from a bed to a chair (including a wheelchair)?: A Little Help needed standing up from a chair using your arms (e.g., wheelchair or bedside chair)?: A Little Help needed to walk in hospital room?: A Little Help needed climbing 3-5  steps with a railing? : A Lot 6 Click Score: 17    End of Session Equipment Utilized During Treatment: Gait belt Activity Tolerance: Patient tolerated treatment well Patient left: with call bell/phone within reach;with SCD's reapplied;in chair;with chair alarm set Nurse Communication: Mobility status PT Visit Diagnosis: Unsteadiness on feet (R26.81);Other abnormalities of gait and mobility (R26.89);Muscle weakness (generalized) (M62.81);Difficulty in walking, not elsewhere classified (R26.2);History of falling (Z91.81);Pain Pain - Right/Left: Right Pain - part of body: Hip     Time: 9924-2683 PT Time Calculation (min) (ACUTE ONLY): 23 min  Charges:  $Gait Training: 8-22 mins $Therapeutic Activity: 8-22 mins                    Julaine Fusi PTA 05/23/21, 12:51 PM

## 2021-05-23 NOTE — Discharge Instructions (Signed)
Follow up with [facility] [facility] in 2 weeks Please remove provena negative pressure dressing on 06/01/2021 and apply honey comb dressing. Keep dressing clean and dry at all times.

## 2021-05-23 NOTE — Progress Notes (Signed)
Subjective: 2 Days Post-Op Procedure(s) (LRB): ANTERIOR HIP TOTAL ARTHROPLASTY (Right) Patient reports pain as mild.   Patient is well, and has had no acute complaints or problems Denies any CP, SOB, ABD pain. Reports that he is passing gas this morning, no BM since surgery. Reports that he feels weak when trying to ambulate.  Objective: Vital signs in last 24 hours: Temp:  [98.1 F (36.7 C)-99.2 F (37.3 C)] 98.4 F (36.9 C) (11/24 0728) Pulse Rate:  [60-78] 66 (11/24 0728) Resp:  [16-20] 16 (11/24 0728) BP: (109-135)/(68-98) 133/79 (11/24 0728) SpO2:  [95 %-99 %] 95 % (11/24 0728)  Intake/Output from previous day: 11/23 0701 - 11/24 0700 In: 2219.9 [P.O.:720; I.V.:1499.9] Out: 2440 [Urine:2440] Intake/Output this shift: No intake/output data recorded.  Recent Labs    05/21/21 0431 05/22/21 0554 05/22/21 1304 05/22/21 2045 05/23/21 0529  HGB 14.4 11.5* 11.4* 10.8* 11.4*   Recent Labs    05/22/21 2045 05/23/21 0529  WBC 12.3* 9.5  RBC 3.56* 3.61*  HCT 30.4* 30.9*  PLT 143* 169   Recent Labs    05/22/21 0554 05/23/21 0529  NA 135 137  K 3.8 3.4*  CL 108 110  CO2 21* 22  BUN 17 14  CREATININE 0.82 0.70  GLUCOSE 149* 119*  CALCIUM 7.8* 7.6*   Recent Labs    05/20/21 1739 05/21/21 0431  INR 1.4* 1.5*    EXAM General - Patient is Alert, Appropriate, and Oriented Extremity - Neurovascular intact Sensation intact distally Intact pulses distally Dorsiflexion/Plantar flexion intact Dressing - dressing C/D/I and no drainage, prevena intact with out drainage Motor Function - intact, moving foot and toes well on exam.  Abdomen soft with normal bowel sounds.  Past Medical History:  Diagnosis Date   Hypertension    Hypertriglyceridemia .    Assessment/Plan:   2 Days Post-Op Procedure(s) (LRB): ANTERIOR HIP TOTAL ARTHROPLASTY (Right) Principal Problem:   Sepsis ([place]) Active Problems:   Altered mental state   Schizophrenia ([place])   Fall at home,  initial encounter   Fracture of femoral neck, right, closed (HCC)   Hyperglycemia   Elevated CK   Rhabdomyolysis   Elevated troponin   Pancreatic mass  Estimated body mass index is 24.8 kg/m as calculated from the following:   Height as of this encounter: 6\' 1"  (1.854 m).   Weight as of this encounter: 85.3 kg. Advance diet Up with therapy  Pain controlled Labs and VSS K+ 3.4, will supplement. CM to assist with discharge. PT recommending HHPT yesterday, will see how he progresses today.  Follow up with [place] in 2 weeks Please remove provena negative pressure dressing on 06/01/2021 and apply honey comb dressing. Keep dressing clean and dry at all times.   DVT Prophylaxis - Lovenox and TED hose Weight-Bearing as tolerated to right leg   J. Cameron Proud, PA-C [place] 05/23/2021, 8:22 AM

## 2021-05-23 NOTE — TOC Progression Note (Signed)
Transition of Care [facility]) - Progression Note    Patient Details  Name: Pedro Mccoy MRN: [id number] Date of Birth: [birth year]  Transition of Care [facility]) CM/SW [place], RN Phone Number: 05/23/2021, 12:34 PM  Clinical Narrative:   Using [facility] thru [place], Booked the patient an Melburn Popper ref number [id number], Scheduled Pick up at 130, requested a call to the nurses desk 5 min prior to arrival    Expected Discharge Plan: [place] Barriers to Discharge: Continued Medical Work up  Expected Discharge Plan and Services Expected Discharge Plan: [place] Choice: Durable Medical Equipment, Home Health Living arrangements for the past 2 months: Single Family Home Expected Discharge Date: 05/23/21                         HH Arranged: PT, OT HH Agency: [place] ([place]) Date HH Agency Contacted: 05/22/21   Representative spoke with at [place]: [place] (SDOH) Interventions    Readmission Risk Interventions Readmission Risk Prevention Plan 05/22/2021  Transportation Screening Complete  PCP or Specialist Appt within 3-5 Days Complete  HRI or [place] Complete  Social Work Consult for [place] Planning/Counseling Complete  Palliative Care Screening Not Applicable  Medication Review Press photographer) Complete  Some recent data might be hidden

## 2021-05-23 NOTE — Progress Notes (Signed)
[facility] Cardiology    SUBJECTIVE: Patient states to be doing much better no pain no shortness of breath no chest discomfort receiving physical therapy states that his leg is not as mobile as it was yesterday but has yet to undergo physical therapy today but feels well in general   Vitals:   05/22/21 1949 05/23/21 0038 05/23/21 0310 05/23/21 0728  BP: 135/78 109/68 (!) 132/98 133/79  Pulse: 65 60 67 66  Resp: 20 20 18 16   Temp: 99.2 F (37.3 C) 98.8 F (37.1 C) 98.1 F (36.7 C) 98.4 F (36.9 C)  TempSrc:    Oral  SpO2: 97% 96% 95% 95%  Weight:      Height:         Intake/Output Summary (Last 24 hours) at 05/23/2021 0949 Last data filed at 05/23/2021 0300 Gross per 24 hour  Intake 1979.9 ml  Output 1140 ml  Net 839.9 ml      PHYSICAL EXAM  General: Well developed, well nourished, in no acute distress HEENT:  Normocephalic and atramatic Neck:  No JVD.  Lungs: Clear bilaterally to auscultation and percussion. Heart: HRRR . Normal S1 and S2 without gallops or murmurs.  Abdomen: Bowel sounds are positive, abdomen soft and non-tender  Msk:  Back normal, normal gait. Normal strength and tone for age. Extremities: No clubbing, cyanosis or edema.   Neuro: Alert and oriented X 3. Psych:  Good affect, responds appropriately   LABS: Basic Metabolic Panel: Recent Labs    05/21/21 0431 05/22/21 0554 05/23/21 0529  NA 136 135 137  K 3.7 3.8 3.4*  CL 108 108 110  CO2 20* 21* 22  GLUCOSE 137* 149* 119*  BUN 16 17 14   CREATININE 0.87 0.82 0.70  CALCIUM 8.4* 7.8* 7.6*  MG 1.7 2.5* 2.1  PHOS 3.0  --   --    Liver Function Tests: Recent Labs    05/20/21 1524  AST 158*  ALT 43  ALKPHOS 58  BILITOT 2.7*  PROT 6.8  ALBUMIN 3.8   Recent Labs    05/20/21 1739  LIPASE 22   CBC: Recent Labs    05/20/21 1524 05/21/21 0431 05/22/21 0554 05/22/21 2045 05/23/21 0529  WBC 25.6* 19.3*   < > 12.3* 9.5  NEUTROABS 23.0* 17.3*  --   --   --   HGB 16.1 14.4   < > 10.8*  11.4*  HCT 47.1 41.7   < > 30.4* 30.9*  MCV 86.1 85.5   < > 85.4 85.6  PLT 200 171   < > 143* 169   < > = values in this interval not displayed.   Cardiac Enzymes: Recent Labs    05/20/21 1524 05/21/21 0431 05/22/21 1304  CKTOTAL 9,666* 6,770* 2,329*   BNP: Invalid input(s): POCBNP D-Dimer: No results for input(s): DDIMER in the last 72 hours. Hemoglobin A1C: No results for input(s): HGBA1C in the last 72 hours. Fasting Lipid Panel: No results for input(s): CHOL, HDL, LDLCALC, TRIG, CHOLHDL, LDLDIRECT in the last 72 hours. Thyroid Function Tests: No results for input(s): TSH, T4TOTAL, T3FREE, THYROIDAB in the last 72 hours.  Invalid input(s): FREET3 Anemia Panel: No results for input(s): VITAMINB12, FOLATE, FERRITIN, TIBC, IRON, RETICCTPCT in the last 72 hours.  DG C-Arm 1-60 Min-No Report  Result Date: 05/21/2021 Fluoroscopy was utilized by the requesting physician.  No radiographic interpretation.   ECHOCARDIOGRAM COMPLETE  Result Date: 05/21/2021    ECHOCARDIOGRAM REPORT   Patient Name:   VINCIENT VANAMAN Date of  Exam: 05/21/2021 Medical Rec #:  [id number]    Height:       73.0 in Accession #:    [id number]   Weight:       210.0 lb Date of Birth:  19-Sep-1950    BSA:          2.197 m Patient Age:    70 years     BP:           139/84 mmHg Patient Gender: M            HR:           77 bpm. Exam Location:  [facility] Procedure: 2D Echo, Color Doppler, Cardiac Doppler and Intracardiac            Opacification Agent Indications:     I21.4 NSTEMI  History:         Patient has no prior history of Echocardiogram examinations.                  Risk Factors:Hypertension.  Sonographer:     Charmayne Sheer Referring Phys:  [id number] AMY N COX Diagnosing Phys: Nelva Bush MD  Sonographer Comments: Suboptimal subcostal window. Image acquisition challenging due to respiratory motion. Global longitudinal strain was attempted. IMPRESSIONS  1. Left ventricular ejection fraction, by estimation, is 65 to 70%.  The left ventricle has normal function. Left ventricular endocardial border not optimally defined to evaluate regional wall motion. There is mild left ventricular hypertrophy. Left ventricular diastolic parameters were normal.  2. Right ventricular systolic function is normal. The right ventricular size is normal.  3. The mitral valve is grossly normal. No evidence of mitral valve regurgitation. No evidence of mitral stenosis.  4. The aortic valve is tricuspid. Aortic valve regurgitation is not visualized. No aortic stenosis is present. FINDINGS  Left Ventricle: Left ventricular ejection fraction, by estimation, is 65 to 70%. The left ventricle has normal function. Left ventricular endocardial border not optimally defined to evaluate regional wall motion. Definity contrast agent was given IV to delineate the left ventricular endocardial borders. Global longitudinal strain performed but not reported based on interpreter judgement due to suboptimal tracking. The left ventricular internal cavity size was normal in size. There is mild left ventricular hypertrophy. Left ventricular diastolic parameters were normal. Right Ventricle: The right ventricular size is normal. No increase in right ventricular wall thickness. Right ventricular systolic function is normal. Left Atrium: Left atrial size was normal in size. Right Atrium: Right atrial size was normal in size. Pericardium: The pericardium was not well visualized. Mitral Valve: The mitral valve is grossly normal. There is mild thickening of the mitral valve leaflet(s). There is mild calcification of the mitral valve leaflet(s). No evidence of mitral valve regurgitation. No evidence of mitral valve stenosis. MV peak gradient, 5.7 mmHg. The mean mitral valve gradient is 2.0 mmHg. Tricuspid Valve: The tricuspid valve is normal in structure. Tricuspid valve regurgitation is not demonstrated. Aortic Valve: The aortic valve is tricuspid. Aortic valve regurgitation is not  visualized. No aortic stenosis is present. Aortic valve mean gradient measures 6.0 mmHg. Aortic valve peak gradient measures 11.3 mmHg. Aortic valve area, by VTI measures 4.09  cm. Pulmonic Valve: The pulmonic valve was not well visualized. Pulmonic valve regurgitation is not visualized. No evidence of pulmonic stenosis. Aorta: The aortic root is normal in size and structure. Pulmonary Artery: The pulmonary artery is not well seen. Venous: The inferior vena cava was not well visualized. IAS/Shunts: The interatrial septum was not  well visualized.  LEFT VENTRICLE PLAX 2D LVIDd:         3.80 cm   Diastology LVIDs:         2.96 cm   LV e' medial:    8.49 cm/s LV PW:         1.28 cm   LV E/e' medial:  9.4 LV IVS:        1.07 cm   LV e' lateral:   11.40 cm/s LVOT diam:     2.30 cm   LV E/e' lateral: 7.0 LV SV:         126 LV SV Index:   57 LVOT Area:     4.15 cm  RIGHT VENTRICLE RV Basal diam:  3.26 cm RV S prime:     14.80 cm/s LEFT ATRIUM             Index        RIGHT ATRIUM           Index LA diam:        5.00 cm 2.28 cm/m   RA Area:     16.40 cm LA Vol (A2C):   52.3 ml 23.81 ml/m  RA Volume:   44.80 ml  20.39 ml/m LA Vol (A4C):   64.7 ml 29.45 ml/m LA Biplane Vol: 62.9 ml 28.63 ml/m  AORTIC VALVE                     PULMONIC VALVE AV Area (Vmax):    3.61 cm      PV Vmax:       0.89 m/s AV Area (Vmean):   3.85 cm      PV Vmean:      56.700 cm/s AV Area (VTI):     4.09 cm      PV VTI:        0.160 m AV Vmax:           168.00 cm/s   PV Peak grad:  3.2 mmHg AV Vmean:          110.000 cm/s  PV Mean grad:  1.0 mmHg AV VTI:            0.309 m AV Peak Grad:      11.3 mmHg AV Mean Grad:      6.0 mmHg LVOT Vmax:         146.00 cm/s LVOT Vmean:        102.000 cm/s LVOT VTI:          0.304 m LVOT/AV VTI ratio: 0.98  AORTA Ao Root diam: 3.50 cm MITRAL VALVE MV Area (PHT): 3.19 cm     SHUNTS MV Area VTI:   4.18 cm     Systemic VTI:  0.30 m MV Peak grad:  5.7 mmHg     Systemic Diam: 2.30 cm MV Mean grad:  2.0 mmHg MV  Vmax:       1.19 m/s MV Vmean:      75.0 cm/s MV Decel Time: 238 msec MV E velocity: 79.70 cm/s MV A velocity: 108.00 cm/s MV E/A ratio:  0.74 Christopher End MD Electronically signed by Nelva Bush MD Signature Date/Time: 05/21/2021/11:05:05 AM    Final    DG HIP UNILAT WITH PELVIS 1V RIGHT  Result Date: 05/21/2021 CLINICAL DATA:  Right hip arthroplasty EXAM: DG HIP (WITH OR WITHOUT PELVIS) 1V RIGHT COMPARISON:  Pelvis radiographs dated 1 day prior FINDINGS: Three C-arm fluoroscopic images were obtained intraoperatively and  submitted for post operative interpretation. The initial image demonstrates a right femoral neck fracture in improved alignment compared to the pelvis radiographs obtained 1 day prior. Subsequent images demonstrate postsurgical changes reflecting right hip arthroplasty. Hardware alignment is within expected limits, without evidence of complication. Fluoro time 14 seconds. Please see the performing provider's procedural report for further detail. IMPRESSION: Status post right hip arthroplasty without evidence of complication. Electronically Signed   By: Valetta Mole M.D.   On: 05/21/2021 14:17   DG HIP UNILAT W OR W/O PELVIS 2-3 VIEWS RIGHT  Result Date: 05/21/2021 CLINICAL DATA:  Status post right hip replacement. EXAM: DG HIP (WITH OR WITHOUT PELVIS) 2-3V RIGHT COMPARISON:  Fluoroscopic images same day. FINDINGS: Right acetabular and femoral components are well situated. Expected postoperative changes seen in the surrounding soft tissues. IMPRESSION: Status post right total hip arthroplasty. Electronically Signed   By: Marijo Conception M.D.   On: 05/21/2021 15:26     Echo preserved overall left ventricular function  TELEMETRY: Normal sinus rhythm rate of 64:  ASSESSMENT AND PLAN:  Principal Problem:   Sepsis ([place]) Active Problems:   Altered mental state   Schizophrenia ([place])   Fall at home, initial encounter   Fracture of femoral neck, right, closed (HCC)    Hyperglycemia   Elevated CK   Rhabdomyolysis   Elevated troponin   Pancreatic mass    Plan Status post hip fracture repair on the right recommend continue physical therapy increased activity Elevated troponins related to rhabdo do not recommend any direct therapy related changes maintain adequate hydration Recommend aspirin daily no other anticoagulation necessary except DVT prophylaxis Telemetry no longer necessary patient can be transferred off of telemetry to orthopedics Mental status has significantly improved patient alert conversing feels reasonably well Continue diabetes management and control Chronic mass incidentally found may need further work-up Agree with postop therapy for lipid management Maintained schizophrenia therapy  Cardiology will sign off for now have the patient follow-up with cardiology as an outpatient as necessary 2 to 4 weeks    Yolonda Kida, MD 05/23/2021 9:49 AM

## 2021-05-24 LAB — SURGICAL PATHOLOGY

## 2021-05-25 LAB — CULTURE, BLOOD (ROUTINE X 2): Culture: NO GROWTH

## 2021-05-26 NOTE — Discharge Summary (Signed)
Physician Discharge Summary   Patient name: Nikan Ellingson  Admit date:     05/20/2021  Discharge date: 05/23/2021   Discharge Physician: Berle Mull   PCP: Romualdo Bolk, FNP   Recommendations at discharge: follow up with Orthopedics as recommended  Will require an MRI pancreatic protocol. Follow up with PCP  Discharge Diagnoses Principal Problem:   Sepsis ([place]) Active Problems:   Altered mental state   Schizophrenia ([place])   Fall at home, initial encounter   Fracture of femoral neck, right, closed (HCC)   Hyperglycemia   Elevated CK   Rhabdomyolysis   Elevated troponin   Pancreatic mass  Hospital Course   Right femoral neck impacted subcapital fracture. Present on admission. Mechanical fall. Prior history of recurrent fall. Appreciate consultation from Dr. Youlanda Mighty. Underwent anterior total hip arthroplasty right. 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 Weightbearing and PT OT.  Orthopedics pain control per orthopedics.   Nontraumatic rhabdomyolysis. Patient had a mechanical fall.  Patient was on the ground for 3 hours after the fall. CK elevated. Rhabdomyolysis likely from the being on ground.  treated with IV fluids.   Elevated troponin. Possible demand ischemia. I do not think the patient actually has non-STEMI. EKG unremarkable. Started on heparin drip.  Heparin now stopped. cardiology consultation. Echocardiogram shows preserved EF. Likely troponin elevation secondary to rhabdomyolysis with CK elevation.   Concern for SIRS.  Sepsis ruled out. Heart rate elevated at the time of admission with leukocytosis and lactic acidosis. Suspect this is actually his been secondary to fall and trauma rather than sepsis.   Staph epi bacteremia 1 out of 4 blood culture bottles positive for staph epi. Suspect this is more likely contamination rather than an actual infection. 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  2.7 cm mass in the body of the pancreas.  Concerning for neoplasm or focal collection. Will require an MRI pancreatic protocol. Follow up with PCP   Type 2 diabetes mellitus, without any long-term insulin use.  Controlled. Continue home regimen    Hyperlipidemia. Continue Crestor.   History of schizophrenia pretension patient is on Haldol, risperidone and lamotrigine. Will continue.   Depression. On fluoxetine furosemide. On Ativan as well.  We will continue.   Multiple bruising and scratch marks. Present on admission. Monitor.   L1 compression fracture. Chronic appearing. No pain. Outpatient follow-up with PT OT.  Procedures performed: hip arthroplasty    Condition at discharge: good  Exam General: Appear in mild distress, no Rash; Oral Mucosa Clear, moist. no Abnormal Neck Mass Or lumps, Conjunctiva normal  Cardiovascular: S1 and S2 Present, no Murmur, Respiratory: good respiratory effort, Bilateral Air entry present and CTA, no Crackles, no wheezes Abdomen: Bowel Sound present, Soft and no tenderness Extremities: no Pedal edema Neurology: alert and oriented to time, place, and person affect appropriate. no new focal deficit Gait not checked due to patient safety concerns     Disposition: Home  Discharge time: greater than 30 minutes.  Follow-up Information     [facility].   Specialty: Home Health Services Why: They will follow up with you for your home health needs.        Duanne Guess, PA-C Follow up in 2 week(s).   Specialties: Orthopedic Surgery, Emergency Medicine Contact information: [place] [address] [phone]         Romualdo Bolk, FNP. Schedule an  appointment as soon as possible for a visit in 1 week(s).   Specialty: Nurse Practitioner Contact information: [place] [address] [phone]                 Allergies as of  05/23/2021       Reactions   Pioglitazone Nausea Only   Ace Inhibitors    Codeine    Sitagliptin Nausea And Vomiting   Lithium Diarrhea        Medication List     STOP taking these medications    NIFEdipine 30 MG 24 hr tablet Commonly known as: ADALAT CC   telmisartan 20 MG tablet Commonly known as: MICARDIS       TAKE these medications    apixaban 2.5 MG Tabs tablet Commonly known as: ELIQUIS Take 1 tablet (2.5 mg total) by mouth 2 (two) times daily for 13 days.   baclofen 10 MG tablet Commonly known as: LIORESAL Take 1 tablet (10 mg total) by mouth 2 (two) times daily.   benztropine 0.5 MG tablet Commonly known as: COGENTIN Take 0.5 mg by mouth 2 (two) times a day.   docusate sodium 100 MG capsule Commonly known as: COLACE Take 1 capsule (100 mg total) by mouth 2 (two) times daily.   Eszopiclone 3 MG Tabs Take 3 mg by mouth at bedtime.   FLUoxetine 20 MG capsule Commonly known as: PROZAC Take 20 mg by mouth daily.   fluvoxaMINE 100 MG tablet Commonly known as: LUVOX Take 100 mg by mouth 2 (two) times a day.   haloperidol 5 MG tablet Commonly known as: HALDOL Take 2.5 mg by mouth daily.   lamoTRIgine 200 MG tablet Commonly known as: LAMICTAL Take 200 mg by mouth 2 (two) times a day.   LORazepam 1 MG tablet Commonly known as: ATIVAN Take 1-1.5 mg by mouth daily as needed for anxiety.   metFORMIN 1000 MG tablet Commonly known as: GLUCOPHAGE Take 1,000 mg by mouth 2 (two) times a day.   metoCLOPramide 10 MG tablet Commonly known as: REGLAN Take 1 tablet (10 mg total) by mouth 3 (three) times daily before meals.   metoprolol succinate 50 MG 24 hr tablet Commonly known as: TOPROL-XL Take 50 mg by mouth daily. Take with or immediately following a meal.   omega-3 acid ethyl esters 1 g capsule Commonly known as: LOVAZA Take 1 g by mouth daily.   oxyCODONE-acetaminophen 5-325 MG tablet Commonly known as: PERCOCET/ROXICET Take 1 tablet by  mouth every 6 (six) hours as needed for moderate pain.   risperiDONE 1 MG tablet Commonly known as: RISPERDAL Take 1 mg by mouth 2 (two) times a day.   rosuvastatin 20 MG tablet Commonly known as: CRESTOR Take 20 mg by mouth daily.               Discharge Care Instructions  (From admission, onward)           Start     Ordered   05/23/21 0000  Leave dressing on - Keep it clean, dry, and intact until clinic visit        05/23/21 1134            DG Chest 1 View  Result Date: 05/20/2021 CLINICAL DATA:  Fall, found down EXAM: CHEST  1 VIEW COMPARISON:  11/14/2018 FINDINGS: Mild cardiomegaly. Both lungs are clear. The visualized skeletal structures are unremarkable. IMPRESSION: Mild cardiomegaly without acute abnormality of the lungs in AP portable projection. Electronically Signed  By: Delanna Ahmadi M.D.   On: 05/20/2021 16:48   CT Head Wo Contrast  Result Date: 05/20/2021 CLINICAL DATA:  Un witnessed fall with headaches, initial encounter EXAM: CT HEAD WITHOUT CONTRAST TECHNIQUE: Contiguous axial images were obtained from the base of the skull through the vertex without intravenous contrast. COMPARISON:  05/20/2021 FINDINGS: Brain: No evidence of acute infarction, hemorrhage, hydrocephalus, extra-axial collection or mass lesion/mass effect. Chronic atrophic and ischemic changes are noted stable in appearance from the prior exam. Vascular: No hyperdense vessel or unexpected calcification. Skull: Normal. Negative for fracture or focal lesion. Sinuses/Orbits: No acute finding. Other: None. IMPRESSION: Chronic atrophic and ischemic changes stable from the recent exam. No acute abnormality noted. Electronically Signed   By: Inez Catalina M.D.   On: 05/20/2021 21:30   CT Head Wo Contrast  Result Date: 05/20/2021 CLINICAL DATA:  Head trauma, minor (Age >= 65y) EXAM: CT HEAD WITHOUT CONTRAST TECHNIQUE: Contiguous axial images were obtained from the base of the skull through the  vertex without intravenous contrast. COMPARISON:  CT head 11/14/2018 FINDINGS: Brain: There is no acute intracranial hemorrhage, mass effect, or edema. Gray-white differentiation is preserved. There is no extra-axial fluid collection. Prominence of the ventricles and sulci reflects similar parenchymal volume loss. Minimal patchy low density in the supratentorial white matter is nonspecific but may reflect minor chronic microvascular ischemic changes. Vascular: No hyperdense vessel or unexpected calcification. Skull: Calvarium is unremarkable. Sinuses/Orbits: Mild mucosal thickening.  Orbits are unremarkable. Other: Right periorbital soft tissue swelling. Mastoid air cells are clear. IMPRESSION: No evidence of acute intracranial injury. Electronically Signed   By: Macy Mis M.D.   On: 05/20/2021 16:32   CT Cervical Spine Wo Contrast  Result Date: 05/20/2021 CLINICAL DATA:  Neck trauma (Age >= 65y) EXAM: CT CERVICAL SPINE WITHOUT CONTRAST TECHNIQUE: Multidetector CT imaging of the cervical spine was performed without intravenous contrast. Multiplanar CT image reconstructions were also generated. COMPARISON:  2020 FINDINGS: Alignment: Stable. Skull base and vertebrae: Stable vertebral body heights. No acute fracture. Soft tissues and spinal canal: No prevertebral fluid or swelling. No visible canal hematoma. Disc levels: Multilevel degenerative changes are present including disc space narrowing, endplate osteophytes, and facet and uncovertebral hypertrophy. Upper chest: No apical lung mass. A small nodule at the left lung apex was present in 2020 and is therefore benign. Other: Exophytic or adherent tissue within the right oropharynx (series 3, image 44) extending toward the piriformis sinus. IMPRESSION: No acute cervical spine fracture. Exophytic or adherent tissue within the right oropharynx. While a mass is difficult to exclude, this is favored to represent small volume retained ingested material.  Electronically Signed   By: Macy Mis M.D.   On: 05/20/2021 16:41   CT ABDOMEN PELVIS W CONTRAST  Result Date: 05/20/2021 CLINICAL DATA:  Abdominal pain, nonlocalized EXAM: CT ABDOMEN AND PELVIS WITH CONTRAST TECHNIQUE: Multidetector CT imaging of the abdomen and pelvis was performed using the standard protocol following bolus administration of intravenous contrast. CONTRAST:  152mL OMNIPAQUE IOHEXOL 300 MG/ML  SOLN COMPARISON:  None. FINDINGS: Lower chest: No focal pulmonary opacity or pleural effusion. No pericardial effusion. Hepatobiliary: Hepatic steatosis. No focal liver abnormality. The hepatic and portal veins are patent. No intra or extrahepatic biliary ductal dilatation. A Phrygian cap is noted on an otherwise normal gallbladder. No gallstones or gallbladder wall thickening. Pancreas: In the body of the pancreas, there is a 2.0 x 2.7 x 2.0 cm mass (series 2, image 20 and series 5, image 39), this  central low density and mild adjacent stranding. Pancreas is otherwise unremarkable. Spleen: Normal in size without focal abnormality. Adrenals/Urinary Tract: The adrenal glands are unremarkable. The kidneys enhance symmetrically with no hydronephrosis. Multiple low-attenuation lesions in the right greater than left kidney, which are too small to characterize but most likely renal cysts. The bladder contains a Foley. Apparent wall thickening is likely secondary to under distension. Stomach/Bowel: Stomach is within normal limits. Appendix appears normal. No evidence of bowel wall thickening, distention, or inflammatory changes. Diverticulosis without evidence of diverticulitis. Vascular/Lymphatic: Aortic atherosclerosis. No enlarged abdominal or pelvic lymph nodes. Reproductive: Prostatomegaly. Other: No abdominal wall hernia or abnormality. No abdominopelvic ascites. Musculoskeletal: Right subcapital femoral neck fracture (series 2, image 78), moderately impacted. Degenerative changes in the lumbar spine  with approximately 40% height loss in the anterior aspect of L1, which appears chronic. Bilateral L5-S1 assimilation joints. IMPRESSION: 1. 2.7 cm mass in the body of the pancreas, with central low density, which may represent a neoplasm or focal collection. Adjacent surrounding suggest inflammatory process. This may represent neoplasm with superimposed acute pancreatitis. Correlate with lipase. An MRI pancreatic protocol is recommended. 2. Right subcapital femoral neck fracture, moderately impacted, as seen on the same-day radiograph. 3. Chronic appearing compression deformity of L1, although no prior spine imaging is available. Correlate with point tenderness. If there is concern that this fracture may be acute, consider MRI. 4. Wall thickening in the bladder, which may be secondary to underdistension, given the presence of a Foley. These results were called by telephone at the time of interpretation on 05/20/2021 at 7:40 pm to provider AMY COX , who verbally acknowledged these results. Electronically Signed   By: Merilyn Baba M.D.   On: 05/20/2021 19:44   DG C-Arm 1-60 Min-No Report  Result Date: 05/21/2021 Fluoroscopy was utilized by the requesting physician.  No radiographic interpretation.   ECHOCARDIOGRAM COMPLETE  Result Date: 05/21/2021    ECHOCARDIOGRAM REPORT   Patient Name:   THURMON MIZELL Date of Exam: 05/21/2021 Medical Rec #:  [id number]    Height:       73.0 in Accession #:    [id number]   Weight:       210.0 lb Date of Birth:  [birth year]    BSA:          2.197 m Patient Age:    89 years     BP:           139/84 mmHg Patient Gender: M            HR:           77 bpm. Exam Location:  [facility] Procedure: 2D Echo, Color Doppler, Cardiac Doppler and Intracardiac            Opacification Agent Indications:     I21.4 NSTEMI  History:         Patient has no prior history of Echocardiogram examinations.                  Risk Factors:Hypertension.  Sonographer:     Charmayne Sheer Referring Phys:  [id number]  N COX Diagnosing Phys: Nelva Bush MD  Sonographer Comments: Suboptimal subcostal window. Image acquisition challenging due to respiratory motion. Global longitudinal strain was attempted. IMPRESSIONS  1. Left ventricular ejection fraction, by estimation, is 65 to 70%. The left ventricle has normal function. Left ventricular endocardial border not optimally defined to evaluate regional wall motion. There is mild left ventricular hypertrophy. Left ventricular diastolic parameters were  normal.  2. Right ventricular systolic function is normal. The right ventricular size is normal.  3. The mitral valve is grossly normal. No evidence of mitral valve regurgitation. No evidence of mitral stenosis.  4. The aortic valve is tricuspid. Aortic valve regurgitation is not visualized. No aortic stenosis is present. FINDINGS  Left Ventricle: Left ventricular ejection fraction, by estimation, is 65 to 70%. The left ventricle has normal function. Left ventricular endocardial border not optimally defined to evaluate regional wall motion. Definity contrast agent was given IV to delineate the left ventricular endocardial borders. Global longitudinal strain performed but not reported based on interpreter judgement due to suboptimal tracking. The left ventricular internal cavity size was normal in size. There is mild left ventricular hypertrophy. Left ventricular diastolic parameters were normal. Right Ventricle: The right ventricular size is normal. No increase in right ventricular wall thickness. Right ventricular systolic function is normal. Left Atrium: Left atrial size was normal in size. Right Atrium: Right atrial size was normal in size. Pericardium: The pericardium was not well visualized. Mitral Valve: The mitral valve is grossly normal. There is mild thickening of the mitral valve leaflet(s). There is mild calcification of the mitral valve leaflet(s). No evidence of mitral valve regurgitation. No evidence of mitral valve  stenosis. MV peak gradient, 5.7 mmHg. The mean mitral valve gradient is 2.0 mmHg. Tricuspid Valve: The tricuspid valve is normal in structure. Tricuspid valve regurgitation is not demonstrated. Aortic Valve: The aortic valve is tricuspid. Aortic valve regurgitation is not visualized. No aortic stenosis is present. Aortic valve mean gradient measures 6.0 mmHg. Aortic valve peak gradient measures 11.3 mmHg. Aortic valve area, by VTI measures 4.09  cm. Pulmonic Valve: The pulmonic valve was not well visualized. Pulmonic valve regurgitation is not visualized. No evidence of pulmonic stenosis. Aorta: The aortic root is normal in size and structure. Pulmonary Artery: The pulmonary artery is not well seen. Venous: The inferior vena cava was not well visualized. IAS/Shunts: The interatrial septum was not well visualized.  LEFT VENTRICLE PLAX 2D LVIDd:         3.80 cm   Diastology LVIDs:         2.96 cm   LV e' medial:    8.49 cm/s LV PW:         1.28 cm   LV E/e' medial:  9.4 LV IVS:        1.07 cm   LV e' lateral:   11.40 cm/s LVOT diam:     2.30 cm   LV E/e' lateral: 7.0 LV SV:         126 LV SV Index:   57 LVOT Area:     4.15 cm  RIGHT VENTRICLE RV Basal diam:  3.26 cm RV S prime:     14.80 cm/s LEFT ATRIUM             Index        RIGHT ATRIUM           Index LA diam:        5.00 cm 2.28 cm/m   RA Area:     16.40 cm LA Vol (A2C):   52.3 ml 23.81 ml/m  RA Volume:   44.80 ml  20.39 ml/m LA Vol (A4C):   64.7 ml 29.45 ml/m LA Biplane Vol: 62.9 ml 28.63 ml/m  AORTIC VALVE                     PULMONIC VALVE AV Area (  Vmax):    3.61 cm      PV Vmax:       0.89 m/s AV Area (Vmean):   3.85 cm      PV Vmean:      56.700 cm/s AV Area (VTI):     4.09 cm      PV VTI:        0.160 m AV Vmax:           168.00 cm/s   PV Peak grad:  3.2 mmHg AV Vmean:          110.000 cm/s  PV Mean grad:  1.0 mmHg AV VTI:            0.309 m AV Peak Grad:      11.3 mmHg AV Mean Grad:      6.0 mmHg LVOT Vmax:         146.00 cm/s LVOT Vmean:         102.000 cm/s LVOT VTI:          0.304 m LVOT/AV VTI ratio: 0.98  AORTA Ao Root diam: 3.50 cm MITRAL VALVE MV Area (PHT): 3.19 cm     SHUNTS MV Area VTI:   4.18 cm     Systemic VTI:  0.30 m MV Peak grad:  5.7 mmHg     Systemic Diam: 2.30 cm MV Mean grad:  2.0 mmHg MV Vmax:       1.19 m/s MV Vmean:      75.0 cm/s MV Decel Time: 238 msec MV E velocity: 79.70 cm/s MV A velocity: 108.00 cm/s MV E/A ratio:  0.74 Christopher End MD Electronically signed by Nelva Bush MD Signature Date/Time: 05/21/2021/11:05:05 AM    Final    DG HIP UNILAT WITH PELVIS 1V RIGHT  Result Date: 05/21/2021 CLINICAL DATA:  Right hip arthroplasty EXAM: DG HIP (WITH OR WITHOUT PELVIS) 1V RIGHT COMPARISON:  Pelvis radiographs dated 1 day prior FINDINGS: Three C-arm fluoroscopic images were obtained intraoperatively and submitted for post operative interpretation. The initial image demonstrates a right femoral neck fracture in improved alignment compared to the pelvis radiographs obtained 1 day prior. Subsequent images demonstrate postsurgical changes reflecting right hip arthroplasty. Hardware alignment is within expected limits, without evidence of complication. Fluoro time 14 seconds. Please see the performing provider's procedural report for further detail. IMPRESSION: Status post right hip arthroplasty without evidence of complication. Electronically Signed   By: Valetta Mole M.D.   On: 05/21/2021 14:17   DG HIP UNILAT W OR W/O PELVIS 2-3 VIEWS RIGHT  Result Date: 05/21/2021 CLINICAL DATA:  Status post right hip replacement. EXAM: DG HIP (WITH OR WITHOUT PELVIS) 2-3V RIGHT COMPARISON:  Fluoroscopic images same day. FINDINGS: Right acetabular and femoral components are well situated. Expected postoperative changes seen in the surrounding soft tissues. IMPRESSION: Status post right total hip arthroplasty. Electronically Signed   By: Marijo Conception M.D.   On: 05/21/2021 15:26   DG Hip Unilat W or Wo Pelvis 2-3 Views  Right  Result Date: 05/20/2021 CLINICAL DATA:  Right hip pain after fall, abrasions EXAM: DG HIP (WITH OR WITHOUT PELVIS) 2-3V RIGHT COMPARISON:  None. FINDINGS: Frontal view of the pelvis as well as frontal and cross-table lateral views of the right hip are obtained. There is an impacted subcapital right femoral neck fracture. No evidence of dislocation. Left hip is unremarkable. Remainder of the bony pelvis is normal. IMPRESSION: 1. Impacted subcapital right femoral neck fracture. Electronically Signed   By: Legrand Como  Owens Shark M.D.   On: 05/20/2021 16:47   Results for orders placed or performed during the hospital encounter of 05/20/21  Resp Panel by RT-PCR (Flu A&B, Covid) Nasopharyngeal Swab     Status: None   Collection Time: 05/20/21  3:24 PM   Specimen: Nasopharyngeal Swab; Nasopharyngeal(NP) swabs in vial transport medium  Result Value Ref Range Status   SARS Coronavirus 2 by RT PCR NEGATIVE NEGATIVE Final    Comment: (NOTE) SARS-CoV-2 target nucleic acids are NOT DETECTED.  The SARS-CoV-2 RNA is generally detectable in upper respiratory specimens during the acute phase of infection. The lowest concentration of SARS-CoV-2 viral copies this assay can detect is 138 copies/mL. A negative result does not preclude SARS-Cov-2 infection and should not be used as the sole basis for treatment or other patient management decisions. A negative result may occur with  improper specimen collection/handling, submission of specimen other than nasopharyngeal swab, presence of viral mutation(s) within the areas targeted by this assay, and inadequate number of viral copies(<138 copies/mL). A negative result must be combined with clinical observations, patient history, and epidemiological information. The expected result is Negative.  Fact Sheet for Patients:  EntrepreneurPulse.com.au  Fact Sheet for Healthcare Providers:  IncredibleEmployment.be  This test is no t  yet approved or cleared by the [place] FDA and  has been authorized for detection and/or diagnosis of SARS-CoV-2 by FDA under an Emergency Use Authorization (EUA). This EUA will remain  in effect (meaning this test can be used) for the duration of the COVID-19 declaration under Section 564(b)(1) of the Act, 21 U.S.C.section 360bbb-3(b)(1), unless the authorization is terminated  or revoked sooner.       Influenza A by PCR NEGATIVE NEGATIVE Final   Influenza B by PCR NEGATIVE NEGATIVE Final    Comment: (NOTE) The Xpert Xpress SARS-CoV-2/FLU/RSV plus assay is intended as an aid in the diagnosis of influenza from Nasopharyngeal swab specimens and should not be used as a sole basis for treatment. Nasal washings and aspirates are unacceptable for Xpert Xpress SARS-CoV-2/FLU/RSV testing.  Fact Sheet for Patients: EntrepreneurPulse.com.au  Fact Sheet for Healthcare Providers: IncredibleEmployment.be  This test is not yet approved or cleared by the [place] FDA and has been authorized for detection and/or diagnosis of SARS-CoV-2 by FDA under an Emergency Use Authorization (EUA). This EUA will remain in effect (meaning this test can be used) for the duration of the COVID-19 declaration under Section 564(b)(1) of the Act, 21 U.S.C. section 360bbb-3(b)(1), unless the authorization is terminated or revoked.  Performed at [facility], [address] [place], [place] [address]   Culture, blood (routine x 2)     Status: Abnormal   Collection Time: 05/20/21  3:24 PM   Specimen: BLOOD  Result Value Ref Range Status   Specimen Description   Final    BLOOD RIGHT UPPER ARM Performed at [facility], [address], [place], [place] [address]    Special Requests   Final    BOTTLES DRAWN AEROBIC AND ANAEROBIC Blood Culture results may not be optimal due to an excessive volume of blood received in culture bottles Performed at  [facility], [address] [place], [place] [address]    Culture  Setup Time   Final    ANAEROBIC BOTTLE ONLY NO ORGANISMS SEEN GRAM POSITIVE COCCI AEROBIC BOTTLE ONLY Organism ID to follow CRITICAL RESULT CALLED TO, READ BACK BY AND VERIFIED WITH: WALID NAZARI @ [id number] 05/21/21.Marland KitchenMarland KitchenTKR Performed at [facility], [address] [place], [place] [address]    Culture (A)  Final    STAPHYLOCOCCUS EPIDERMIDIS STAPHYLOCOCCUS HOMINIS THE SIGNIFICANCE OF ISOLATING THIS ORGANISM FROM A SINGLE SET OF BLOOD CULTURES WHEN MULTIPLE SETS ARE DRAWN IS UNCERTAIN. PLEASE NOTIFY THE MICROBIOLOGY DEPARTMENT WITHIN ONE WEEK IF SPECIATION AND SENSITIVITIES ARE REQUIRED. Performed at [facility], [address] [place], [place] [address]    Report Status 05/23/2021 FINAL  Final  Blood Culture ID Panel (Reflexed)     Status: Abnormal   Collection Time: 05/20/21  3:24 PM  Result Value Ref Range Status   Enterococcus faecalis NOT DETECTED NOT DETECTED Final   Enterococcus Faecium NOT DETECTED NOT DETECTED Final   Listeria monocytogenes NOT DETECTED NOT DETECTED Final   Staphylococcus species DETECTED (A) NOT DETECTED Final    Comment: CRITICAL RESULT CALLED TO, READ BACK BY AND VERIFIED WITH: WALID NAZARI @ 1019 05/21/21.Marland KitchenMarland KitchenTKR    Staphylococcus aureus (BCID) NOT DETECTED NOT DETECTED Final   Staphylococcus epidermidis DETECTED (A) NOT DETECTED Final    Comment: Methicillin (oxacillin) resistant coagulase negative staphylococcus. Possible blood culture contaminant (unless isolated from more than one blood culture draw or clinical case suggests pathogenicity). No antibiotic treatment is indicated for blood  culture contaminants. CRITICAL RESULT CALLED TO, READ BACK BY AND VERIFIED WITH: WALID NAZARI @ 1019 05/21/21.Marland KitchenMarland KitchenTKR    Staphylococcus lugdunensis NOT DETECTED NOT DETECTED Final   Streptococcus species NOT DETECTED NOT DETECTED Final   Streptococcus agalactiae NOT DETECTED  NOT DETECTED Final   Streptococcus pneumoniae NOT DETECTED NOT DETECTED Final   Streptococcus pyogenes NOT DETECTED NOT DETECTED Final   A.calcoaceticus-baumannii NOT DETECTED NOT DETECTED Final   Bacteroides fragilis NOT DETECTED NOT DETECTED Final   Enterobacterales NOT DETECTED NOT DETECTED Final   Enterobacter cloacae complex NOT DETECTED NOT DETECTED Final   Escherichia coli NOT DETECTED NOT DETECTED Final   Klebsiella aerogenes NOT DETECTED NOT DETECTED Final   Klebsiella oxytoca NOT DETECTED NOT DETECTED Final   Klebsiella pneumoniae NOT DETECTED NOT DETECTED Final   Proteus species NOT DETECTED NOT DETECTED Final   Salmonella species NOT DETECTED NOT DETECTED Final   Serratia marcescens NOT DETECTED NOT DETECTED Final   Haemophilus influenzae NOT DETECTED NOT DETECTED Final   Neisseria meningitidis NOT DETECTED NOT DETECTED Final   Pseudomonas aeruginosa NOT DETECTED NOT DETECTED Final   Stenotrophomonas maltophilia NOT DETECTED NOT DETECTED Final   Candida albicans NOT DETECTED NOT DETECTED Final   Candida auris NOT DETECTED NOT DETECTED Final   Candida glabrata NOT DETECTED NOT DETECTED Final   Candida krusei NOT DETECTED NOT DETECTED Final   Candida parapsilosis NOT DETECTED NOT DETECTED Final   Candida tropicalis NOT DETECTED NOT DETECTED Final   Cryptococcus neoformans/gattii NOT DETECTED NOT DETECTED Final   Methicillin resistance mecA/C DETECTED (A) NOT DETECTED Final    Comment: CRITICAL RESULT CALLED TO, READ BACK BY AND VERIFIED WITH: WALID NAZARI @ 1019 05/21/21.Marland KitchenMarland KitchenTKR Performed at [facility], [address] [place], [place] [address]   Culture, blood (routine x 2)     Status: None   Collection Time: 05/20/21  5:04 PM   Specimen: BLOOD  Result Value Ref Range Status   Specimen Description BLOOD LEFT ANTECUBITAL  Final   Special Requests   Final    BOTTLES DRAWN AEROBIC AND ANAEROBIC Blood Culture results may not be optimal due to an excessive volume  of blood received in culture bottles   Culture   Final    NO GROWTH 5  DAYS Performed at [facility], [address] [place], [place] [address]    Report Status 05/25/2021 FINAL  Final    Signed:  Berle Mull MD.  [facility] 05/23/2021, 7:18 AM

## 2021-05-26 NOTE — Consult Note (Signed)
CARDIOLOGY CONSULT NOTE               Patient ID: Pedro Mccoy MRN: [id number] DOB/AGE: 70-Oct-1952 70 y.o.  Admit date: 05/20/2021 Referring Physician Dr. Rupert Stacks  hospitalist Primary Physician  Primary Cardiologist  Reason for Consultation elevated troponins preop for hip surgery  HPI: Patient is a 70 year old male recently had a mechanical fall suffering a fractured hip now preop for surgery hypertension hyperlipidemia non-insulin-dependent diabetes schizophrenia anxiety depression reportedly had a mechanical fall with elevated CPK and troponins suggestive of rhabdo.  Little bit lethargic dehydrated weak fatigue denies any chest pain or shortness of breath.  Patient denies syncope feels like he just lost his balance and had a mechanical fall now preop for surgery  Review of systems complete and found to be negative unless listed above     Past Medical History:  Diagnosis Date   Hypertension    Hypertriglyceridemia .    Past Surgical History:  Procedure Laterality Date   NO PAST SURGERIES     TOTAL HIP ARTHROPLASTY Right 05/21/2021   Procedure: ANTERIOR HIP TOTAL ARTHROPLASTY;  Surgeon: Hessie Knows, MD;  Location: [facility] ORS;  Service: Orthopedics;  Laterality: Right;    No medications prior to admission.   Social History   Socioeconomic History   Marital status: Widowed    Spouse name: Not on file   Number of children: Not on file   Years of education: Not on file   Highest education level: Not on file  Occupational History   Not on file  Tobacco Use   Smoking status: Never   Smokeless tobacco: Never  Vaping Use   Vaping Use: Never used  Substance and Sexual Activity   Alcohol use: No   Drug use: No   Sexual activity: Not on file  Other Topics Concern   Not on file  Social History Narrative   Not on file   Social Determinants of Health   Financial Resource Strain: Not on file  Food Insecurity: Not on file  Transportation Needs: Not on file   Physical Activity: Not on file  Stress: Not on file  Social Connections: Not on file  Intimate Partner Violence: Not on file    Family History  Problem Relation Age of Onset   COPD Mother    Heart attack Father       Review of systems complete and found to be negative unless listed above      PHYSICAL EXAM  General: Well developed, well nourished, in no acute distress HEENT:  Normocephalic and atramatic Neck:  No JVD.  Lungs: Clear bilaterally to auscultation and percussion. Heart: HRRR . Normal S1 and S2 without gallops or murmurs.  Abdomen: Bowel sounds are positive, abdomen soft and non-tender  Msk:  Back normal, normal gait. Normal strength and tone for age. Extremities: No clubbing, cyanosis or edema.   Neuro: Alert and oriented X 3. Psych:  Good affect, responds appropriately  Labs:   Lab Results  Component Value Date   WBC 9.5 05/23/2021   HGB 11.4 (L) 05/23/2021   HCT 30.9 (L) 05/23/2021   MCV 85.6 05/23/2021   PLT 169 05/23/2021    Recent Labs  Lab 05/20/21 1524 05/21/21 0431 05/23/21 0529  NA 138   < > 137  K 4.3   < > 3.4*  CL 103   < > 110  CO2 17*   < > 22  BUN 13   < > 14  CREATININE 1.22   < >  0.70  CALCIUM 9.6   < > 7.6*  PROT 6.8  --   --   BILITOT 2.7*  --   --   ALKPHOS 58  --   --   ALT 43  --   --   AST 158*  --   --   GLUCOSE 185*   < > 119*   < > = values in this interval not displayed.   Lab Results  Component Value Date   CKTOTAL 2,329 (H) 05/22/2021   TROPONINI <0.03 09/09/2018   No results found for: CHOL No results found for: HDL No results found for: LDLCALC No results found for: TRIG No results found for: CHOLHDL No results found for: LDLDIRECT    Radiology: DG Chest 1 View  Result Date: 05/20/2021 CLINICAL DATA:  Fall, found down EXAM: CHEST  1 VIEW COMPARISON:  11/14/2018 FINDINGS: Mild cardiomegaly. Both lungs are clear. The visualized skeletal structures are unremarkable. IMPRESSION: Mild cardiomegaly  without acute abnormality of the lungs in AP portable projection. Electronically Signed   By: Delanna Ahmadi M.D.   On: 05/20/2021 16:48   CT Head Wo Contrast  Result Date: 05/20/2021 CLINICAL DATA:  Un witnessed fall with headaches, initial encounter EXAM: CT HEAD WITHOUT CONTRAST TECHNIQUE: Contiguous axial images were obtained from the base of the skull through the vertex without intravenous contrast. COMPARISON:  05/20/2021 FINDINGS: Brain: No evidence of acute infarction, hemorrhage, hydrocephalus, extra-axial collection or mass lesion/mass effect. Chronic atrophic and ischemic changes are noted stable in appearance from the prior exam. Vascular: No hyperdense vessel or unexpected calcification. Skull: Normal. Negative for fracture or focal lesion. Sinuses/Orbits: No acute finding. Other: None. IMPRESSION: Chronic atrophic and ischemic changes stable from the recent exam. No acute abnormality noted. Electronically Signed   By: Inez Catalina M.D.   On: 05/20/2021 21:30   CT Head Wo Contrast  Result Date: 05/20/2021 CLINICAL DATA:  Head trauma, minor (Age >= 65y) EXAM: CT HEAD WITHOUT CONTRAST TECHNIQUE: Contiguous axial images were obtained from the base of the skull through the vertex without intravenous contrast. COMPARISON:  CT head 11/14/2018 FINDINGS: Brain: There is no acute intracranial hemorrhage, mass effect, or edema. Gray-white differentiation is preserved. There is no extra-axial fluid collection. Prominence of the ventricles and sulci reflects similar parenchymal volume loss. Minimal patchy low density in the supratentorial white matter is nonspecific but may reflect minor chronic microvascular ischemic changes. Vascular: No hyperdense vessel or unexpected calcification. Skull: Calvarium is unremarkable. Sinuses/Orbits: Mild mucosal thickening.  Orbits are unremarkable. Other: Right periorbital soft tissue swelling. Mastoid air cells are clear. IMPRESSION: No evidence of acute intracranial  injury. Electronically Signed   By: Macy Mis M.D.   On: 05/20/2021 16:32   CT Cervical Spine Wo Contrast  Result Date: 05/20/2021 CLINICAL DATA:  Neck trauma (Age >= 65y) EXAM: CT CERVICAL SPINE WITHOUT CONTRAST TECHNIQUE: Multidetector CT imaging of the cervical spine was performed without intravenous contrast. Multiplanar CT image reconstructions were also generated. COMPARISON:  2020 FINDINGS: Alignment: Stable. Skull base and vertebrae: Stable vertebral body heights. No acute fracture. Soft tissues and spinal canal: No prevertebral fluid or swelling. No visible canal hematoma. Disc levels: Multilevel degenerative changes are present including disc space narrowing, endplate osteophytes, and facet and uncovertebral hypertrophy. Upper chest: No apical lung mass. A small nodule at the left lung apex was present in 2020 and is therefore benign. Other: Exophytic or adherent tissue within the right oropharynx (series 3, image 44) extending toward the piriformis  sinus. IMPRESSION: No acute cervical spine fracture. Exophytic or adherent tissue within the right oropharynx. While a mass is difficult to exclude, this is favored to represent small volume retained ingested material. Electronically Signed   By: Macy Mis M.D.   On: 05/20/2021 16:41   CT ABDOMEN PELVIS W CONTRAST  Result Date: 05/20/2021 CLINICAL DATA:  Abdominal pain, nonlocalized EXAM: CT ABDOMEN AND PELVIS WITH CONTRAST TECHNIQUE: Multidetector CT imaging of the abdomen and pelvis was performed using the standard protocol following bolus administration of intravenous contrast. CONTRAST:  127mL OMNIPAQUE IOHEXOL 300 MG/ML  SOLN COMPARISON:  None. FINDINGS: Lower chest: No focal pulmonary opacity or pleural effusion. No pericardial effusion. Hepatobiliary: Hepatic steatosis. No focal liver abnormality. The hepatic and portal veins are patent. No intra or extrahepatic biliary ductal dilatation. A Phrygian cap is noted on an otherwise normal  gallbladder. No gallstones or gallbladder wall thickening. Pancreas: In the body of the pancreas, there is a 2.0 x 2.7 x 2.0 cm mass (series 2, image 20 and series 5, image 39), this central low density and mild adjacent stranding. Pancreas is otherwise unremarkable. Spleen: Normal in size without focal abnormality. Adrenals/Urinary Tract: The adrenal glands are unremarkable. The kidneys enhance symmetrically with no hydronephrosis. Multiple low-attenuation lesions in the right greater than left kidney, which are too small to characterize but most likely renal cysts. The bladder contains a Foley. Apparent wall thickening is likely secondary to under distension. Stomach/Bowel: Stomach is within normal limits. Appendix appears normal. No evidence of bowel wall thickening, distention, or inflammatory changes. Diverticulosis without evidence of diverticulitis. Vascular/Lymphatic: Aortic atherosclerosis. No enlarged abdominal or pelvic lymph nodes. Reproductive: Prostatomegaly. Other: No abdominal wall hernia or abnormality. No abdominopelvic ascites. Musculoskeletal: Right subcapital femoral neck fracture (series 2, image 78), moderately impacted. Degenerative changes in the lumbar spine with approximately 40% height loss in the anterior aspect of L1, which appears chronic. Bilateral L5-S1 assimilation joints. IMPRESSION: 1. 2.7 cm mass in the body of the pancreas, with central low density, which may represent a neoplasm or focal collection. Adjacent surrounding suggest inflammatory process. This may represent neoplasm with superimposed acute pancreatitis. Correlate with lipase. An MRI pancreatic protocol is recommended. 2. Right subcapital femoral neck fracture, moderately impacted, as seen on the same-day radiograph. 3. Chronic appearing compression deformity of L1, although no prior spine imaging is available. Correlate with point tenderness. If there is concern that this fracture may be acute, consider MRI. 4. Wall  thickening in the bladder, which may be secondary to underdistension, given the presence of a Foley. These results were called by telephone at the time of interpretation on 05/20/2021 at 7:40 pm to provider AMY COX , who verbally acknowledged these results. Electronically Signed   By: Merilyn Baba M.D.   On: 05/20/2021 19:44   DG C-Arm 1-60 Min-No Report  Result Date: 05/21/2021 Fluoroscopy was utilized by the requesting physician.  No radiographic interpretation.   ECHOCARDIOGRAM COMPLETE  Result Date: 05/21/2021    ECHOCARDIOGRAM REPORT   Patient Name:   Pedro Mccoy Date of Exam: 05/21/2021 Medical Rec #:  [id number]    Height:       73.0 in Accession #:    [id number]   Weight:       210.0 lb Date of Birth:  02-05-1951    BSA:          2.197 m Patient Age:    5 years     BP:  139/84 mmHg Patient Gender: M            HR:           77 bpm. Exam Location:  [facility] Procedure: 2D Echo, Color Doppler, Cardiac Doppler and Intracardiac            Opacification Agent Indications:     I21.4 NSTEMI  History:         Patient has no prior history of Echocardiogram examinations.                  Risk Factors:Hypertension.  Sonographer:     Charmayne Sheer Referring Phys:  [id number] AMY N COX Diagnosing Phys: Nelva Bush MD  Sonographer Comments: Suboptimal subcostal window. Image acquisition challenging due to respiratory motion. Global longitudinal strain was attempted. IMPRESSIONS  1. Left ventricular ejection fraction, by estimation, is 65 to 70%. The left ventricle has normal function. Left ventricular endocardial border not optimally defined to evaluate regional wall motion. There is mild left ventricular hypertrophy. Left ventricular diastolic parameters were normal.  2. Right ventricular systolic function is normal. The right ventricular size is normal.  3. The mitral valve is grossly normal. No evidence of mitral valve regurgitation. No evidence of mitral stenosis.  4. The aortic valve is tricuspid.  Aortic valve regurgitation is not visualized. No aortic stenosis is present. FINDINGS  Left Ventricle: Left ventricular ejection fraction, by estimation, is 65 to 70%. The left ventricle has normal function. Left ventricular endocardial border not optimally defined to evaluate regional wall motion. Definity contrast agent was given IV to delineate the left ventricular endocardial borders. Global longitudinal strain performed but not reported based on interpreter judgement due to suboptimal tracking. The left ventricular internal cavity size was normal in size. There is mild left ventricular hypertrophy. Left ventricular diastolic parameters were normal. Right Ventricle: The right ventricular size is normal. No increase in right ventricular wall thickness. Right ventricular systolic function is normal. Left Atrium: Left atrial size was normal in size. Right Atrium: Right atrial size was normal in size. Pericardium: The pericardium was not well visualized. Mitral Valve: The mitral valve is grossly normal. There is mild thickening of the mitral valve leaflet(s). There is mild calcification of the mitral valve leaflet(s). No evidence of mitral valve regurgitation. No evidence of mitral valve stenosis. MV peak gradient, 5.7 mmHg. The mean mitral valve gradient is 2.0 mmHg. Tricuspid Valve: The tricuspid valve is normal in structure. Tricuspid valve regurgitation is not demonstrated. Aortic Valve: The aortic valve is tricuspid. Aortic valve regurgitation is not visualized. No aortic stenosis is present. Aortic valve mean gradient measures 6.0 mmHg. Aortic valve peak gradient measures 11.3 mmHg. Aortic valve area, by VTI measures 4.09  cm. Pulmonic Valve: The pulmonic valve was not well visualized. Pulmonic valve regurgitation is not visualized. No evidence of pulmonic stenosis. Aorta: The aortic root is normal in size and structure. Pulmonary Artery: The pulmonary artery is not well seen. Venous: The inferior vena cava  was not well visualized. IAS/Shunts: The interatrial septum was not well visualized.  LEFT VENTRICLE PLAX 2D LVIDd:         3.80 cm   Diastology LVIDs:         2.96 cm   LV e' medial:    8.49 cm/s LV PW:         1.28 cm   LV E/e' medial:  9.4 LV IVS:        1.07 cm   LV e' lateral:  11.40 cm/s LVOT diam:     2.30 cm   LV E/e' lateral: 7.0 LV SV:         126 LV SV Index:   57 LVOT Area:     4.15 cm  RIGHT VENTRICLE RV Basal diam:  3.26 cm RV S prime:     14.80 cm/s LEFT ATRIUM             Index        RIGHT ATRIUM           Index LA diam:        5.00 cm 2.28 cm/m   RA Area:     16.40 cm LA Vol (A2C):   52.3 ml 23.81 ml/m  RA Volume:   44.80 ml  20.39 ml/m LA Vol (A4C):   64.7 ml 29.45 ml/m LA Biplane Vol: 62.9 ml 28.63 ml/m  AORTIC VALVE                     PULMONIC VALVE AV Area (Vmax):    3.61 cm      PV Vmax:       0.89 m/s AV Area (Vmean):   3.85 cm      PV Vmean:      56.700 cm/s AV Area (VTI):     4.09 cm      PV VTI:        0.160 m AV Vmax:           168.00 cm/s   PV Peak grad:  3.2 mmHg AV Vmean:          110.000 cm/s  PV Mean grad:  1.0 mmHg AV VTI:            0.309 m AV Peak Grad:      11.3 mmHg AV Mean Grad:      6.0 mmHg LVOT Vmax:         146.00 cm/s LVOT Vmean:        102.000 cm/s LVOT VTI:          0.304 m LVOT/AV VTI ratio: 0.98  AORTA Ao Root diam: 3.50 cm MITRAL VALVE MV Area (PHT): 3.19 cm     SHUNTS MV Area VTI:   4.18 cm     Systemic VTI:  0.30 m MV Peak grad:  5.7 mmHg     Systemic Diam: 2.30 cm MV Mean grad:  2.0 mmHg MV Vmax:       1.19 m/s MV Vmean:      75.0 cm/s MV Decel Time: 238 msec MV E velocity: 79.70 cm/s MV A velocity: 108.00 cm/s MV E/A ratio:  0.74 Christopher End MD Electronically signed by Nelva Bush MD Signature Date/Time: 05/21/2021/11:05:05 AM    Final    DG HIP UNILAT WITH PELVIS 1V RIGHT  Result Date: 05/21/2021 CLINICAL DATA:  Right hip arthroplasty EXAM: DG HIP (WITH OR WITHOUT PELVIS) 1V RIGHT COMPARISON:  Pelvis radiographs dated 1 day prior  FINDINGS: Three C-arm fluoroscopic images were obtained intraoperatively and submitted for post operative interpretation. The initial image demonstrates a right femoral neck fracture in improved alignment compared to the pelvis radiographs obtained 1 day prior. Subsequent images demonstrate postsurgical changes reflecting right hip arthroplasty. Hardware alignment is within expected limits, without evidence of complication. Fluoro time 14 seconds. Please see the performing provider's procedural report for further detail. IMPRESSION: Status post right hip arthroplasty without evidence of complication. Electronically Signed   By: Court Joy.D.  On: 05/21/2021 14:17   DG HIP UNILAT W OR W/O PELVIS 2-3 VIEWS RIGHT  Result Date: 05/21/2021 CLINICAL DATA:  Status post right hip replacement. EXAM: DG HIP (WITH OR WITHOUT PELVIS) 2-3V RIGHT COMPARISON:  Fluoroscopic images same day. FINDINGS: Right acetabular and femoral components are well situated. Expected postoperative changes seen in the surrounding soft tissues. IMPRESSION: Status post right total hip arthroplasty. Electronically Signed   By: Marijo Conception M.D.   On: 05/21/2021 15:26   DG Hip Unilat W or Wo Pelvis 2-3 Views Right  Result Date: 05/20/2021 CLINICAL DATA:  Right hip pain after fall, abrasions EXAM: DG HIP (WITH OR WITHOUT PELVIS) 2-3V RIGHT COMPARISON:  None. FINDINGS: Frontal view of the pelvis as well as frontal and cross-table lateral views of the right hip are obtained. There is an impacted subcapital right femoral neck fracture. No evidence of dislocation. Left hip is unremarkable. Remainder of the bony pelvis is normal. IMPRESSION: 1. Impacted subcapital right femoral neck fracture. Electronically Signed   By: Randa Ngo M.D.   On: 05/20/2021 16:47    EKG: Normal sinus rhythm rate of about 90 nonspecific ST-T wave changes  ASSESSMENT AND PLAN:  Preop for hip surgery Elevated troponins Rhabdo Mechanical  fall Hypertension Hyperlipidemia Diabetes Schizophrenia  . Plan Agree with admit for mechanical fall and fractured hip Patient appears to be an acceptable moderate risk for hip surgery Recommend hydration for 1 place to be rhabdo from mechanical fall Elevated troponins probably related to rhabdo Echocardiogram for assessment left ventricular function to evaluate for wall motion and possibility of non-STEMI Recommend hydration for possible dehydration and rhabdo Anticoagulation pre and postop DVT prophylaxis possible non-STEMI Continue schizophrenia therapy No invasive cardiac procedures planned   Signed: Yolonda Kida MD 05/26/2021, 2:50 PM

## 2021-06-19 ENCOUNTER — Encounter (HOSPITAL_COMMUNITY): Payer: Self-pay | Admitting: Radiology

## 2021-07-29 ENCOUNTER — Other Ambulatory Visit: Payer: Self-pay

## 2021-07-29 ENCOUNTER — Ambulatory Visit
Admission: EM | Admit: 2021-07-29 | Discharge: 2021-07-29 | Disposition: A | Discharge status: Home / Self Care | Payer: Medicare Other | Attending: Emergency Medicine | Admitting: Emergency Medicine

## 2021-07-29 DIAGNOSIS — H6123 Impacted cerumen, bilateral: Secondary | ICD-10-CM

## 2021-07-29 NOTE — Discharge Instructions (Signed)
We were able to remove the ear wax from your ears and there are no signs of infection or complication  If this occurs in the future you may attempt over-the-counter Debrox drops, which is a medicine that makes it easier to remove earwax , place a few drops into each ear waiting 5 to 10 minutes and then removing with a cottonball  You may return to urgent care as needed

## 2021-07-29 NOTE — ED Provider Notes (Signed)
[facility]    CSN: [id number] Arrival date & time: 07/29/21  1554      History   Chief Complaint Chief Complaint  Patient presents with   Hearing Problem    HPI Pedro Mccoy is a 71 y.o. male.   Patient presents with decreased hearing in the right ear for 3 days.  Endorses that his ear feels clogged and he has required ear washing in the past.  Denies fever, chills, URI symptoms, ear itching, ear drainage or ear pain.  Past Medical History:  Diagnosis Date   Hypertension    Hypertriglyceridemia .    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 Authorizing Provider  baclofen (LIORESAL) 10 MG tablet Take 1 tablet (10 mg total) by mouth 2 (two) times daily. 04/09/21  Yes Lattie Corns, PA-C  benztropine (COGENTIN) 0.5 MG tablet Take 0.5 mg by mouth 2 (two) times a day. 10/20/18  Yes [provider]  docusate sodium (COLACE) 100 MG capsule Take 1 capsule (100 mg total) by mouth 2 (two) times daily. 05/23/21  Yes Lavina Hamman, MD  Eszopiclone 3 MG TABS Take 3 mg by mouth at bedtime. 10/20/18  Yes [provider]  FLUoxetine (PROZAC) 20 MG capsule Take 20 mg by mouth daily. 08/17/18  Yes [provider]  fluvoxaMINE (LUVOX) 100 MG tablet  Take 100 mg by mouth 2 (two) times a day. 10/20/18  Yes [provider]  haloperidol (HALDOL) 5 MG tablet Take 2.5 mg by mouth daily. 10/20/18  Yes [provider]  lamoTRIgine (LAMICTAL) 200 MG tablet Take 200 mg by mouth 2 (two) times a day. 10/20/18  Yes [provider]  LORazepam (ATIVAN) 1 MG tablet Take 1-1.5 mg by mouth daily as needed for anxiety. 10/20/18  Yes [provider]  metFORMIN (GLUCOPHAGE) 1000 MG tablet Take 1,000 mg by mouth 2 (two) times a day.   Yes [provider]  metoCLOPramide (REGLAN) 10 MG tablet Take 1 tablet (10 mg total) by mouth 3 (three) times daily before meals. 04/09/21  Yes Lattie Corns, PA-C  metoprolol succinate (TOPROL-XL) 50 MG 24 hr tablet Take 50 mg by mouth daily. Take with or immediately following a meal.   Yes [provider]  omega-3 acid ethyl esters (LOVAZA) 1 g capsule Take 1 g by mouth daily.   Yes [provider]  oxyCODONE-acetaminophen (PERCOCET/ROXICET) 5-325 MG tablet Take 1 tablet by mouth every 6 (six) hours as needed for moderate pain. 05/22/21  Yes Duanne Guess, PA-C  risperiDONE (RISPERDAL) 1 MG tablet Take 1 mg by mouth 2 (two) times a day. 10/20/18  Yes [provider]  rosuvastatin (CRESTOR) 20 MG tablet Take 20 mg by mouth daily.  Yes [provider]  apixaban (ELIQUIS) 2.5 MG TABS tablet Take 1 tablet (2.5 mg total) by mouth 2 (two) times daily for 13 days. 05/23/21 06/05/21  Lavina Hamman, MD    Family History Family History  Problem Relation Age of Onset   COPD Mother    Heart attack Father     Social History Social History   Tobacco Use   Smoking status: Never   Smokeless tobacco: Never  Vaping Use   Vaping Use: Never used  Substance Use Topics   Alcohol use: No   Drug use: No     Allergies   Pioglitazone, Ace inhibitors, Codeine, Sitagliptin, and Lithium   Review of Systems Review of Systems  Constitutional: Negative.    HENT:  Positive for hearing loss. Negative for congestion, dental problem, drooling, ear discharge, ear pain, facial swelling, mouth sores, nosebleeds, postnasal drip, rhinorrhea, sinus pressure, sinus pain, sneezing, sore throat, tinnitus, trouble swallowing and voice change.   Respiratory: Negative.    Cardiovascular: Negative.   Skin: Negative.   Neurological: Negative.     Physical Exam Triage Vital Signs ED Triage Vitals  Enc Vitals Group     BP 07/29/21 1605 112/69     Pulse Rate 07/29/21 1605 79     Resp 07/29/21 1605 18     Temp 07/29/21 1605 98.9 F (37.2 C)     Temp Source 07/29/21 1605 Oral     SpO2 07/29/21 1605 99 %     Weight 07/29/21 1604 181 lb (82.1 kg)     Height 07/29/21 1604 6\' 1"  (1.854 m)     Head Circumference --      Peak Flow --      Pain Score 07/29/21 1604 0     Pain Loc --      Pain Edu? --      Excl. in [place]? --    No data found.  Updated Vital Signs BP 112/69 (BP Location: Left Arm)    Pulse 79    Temp 98.9 F (37.2 C) (Oral)    Resp 18    Ht 6\' 1"  (1.854 m)    Wt 181 lb (82.1 kg)    SpO2 99%    BMI 23.88 kg/m   Visual Acuity Right Eye Distance:   Left Eye Distance:   Bilateral Distance:    Right Eye Near:   Left Eye Near:    Bilateral Near:     Physical Exam Constitutional:      Appearance: Normal appearance.  HENT:     Head: Normocephalic.     Right Ear: There is impacted cerumen.     Left Ear: There is impacted cerumen.  Eyes:     Extraocular Movements: Extraocular movements intact.  Pulmonary:     Effort: Pulmonary effort is normal.  Skin:    General: Skin is warm and dry.  Neurological:     Mental Status: He is alert and oriented to person, place, and time. Mental status is at baseline.  Psychiatric:        Mood and Affect: Mood normal.        Behavior: Behavior normal.     UC Treatments / Results  Labs (all labs ordered are listed, but only abnormal results are displayed) Labs Reviewed - No data to  display  EKG   Radiology No results found.  Procedures Procedures (including critical care time)  Medications Ordered in UC Medications - No data to display  Initial Impression /  Assessment and Plan / UC Course  I have reviewed the triage vital signs and the nursing notes.  Pertinent labs & imaging results that were available during my care of the patient were reviewed by me and considered in my medical decision making (see chart for details).  Bilateral cerumen impaction  Bilateral ear wash completed by nursing staff, tympanic membrane with no abnormality upon reassessment, advised patient of use of over-the-counter Debrox drops if symptoms recur, may follow-up with urgent care as needed Final Clinical Impressions(s) / UC Diagnoses   Final diagnoses:  None   Discharge Instructions   None    ED Prescriptions   None    PDMP not reviewed this encounter.   Hans Eden, NP 07/29/21 1658

## 2021-07-29 NOTE — ED Triage Notes (Signed)
Pt c/o right ear being clogged and loss of hearing x3days.

## 2022-04-21 IMAGING — CT CT ABD-PELV W/ CM
2 of 5 series · 15 of 46 positions shown, 17 images · IV contrast (APPLIED)
Comparison: None.

CLINICAL DATA: Abdominal pain, nonlocalized

EXAM:
CT ABDOMEN AND PELVIS WITH CONTRAST
TECHNIQUE: Multidetector CT imaging of the abdomen and pelvis was performed
using the standard protocol following bolus administration of
intravenous contrast.
CONTRAST:  100mL OMNIPAQUE IOHEXOL 300 MG/ML  SOLN

[Series 2: routine abd/pel with · axial · 0.84mm/px · z∈[-1083,-648]mm · 12 of 99 slices shown, 14 images]
[im 6/99  soft-tissue]
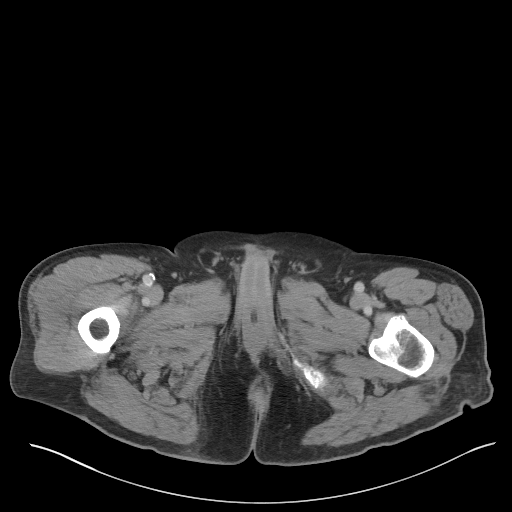
[im 6/99  bone]
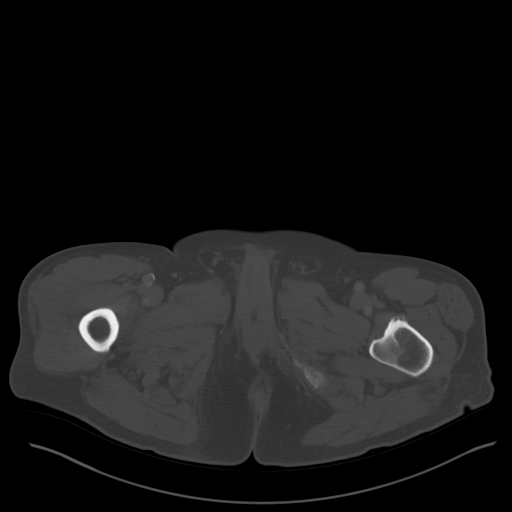
[im 16/99  soft-tissue]
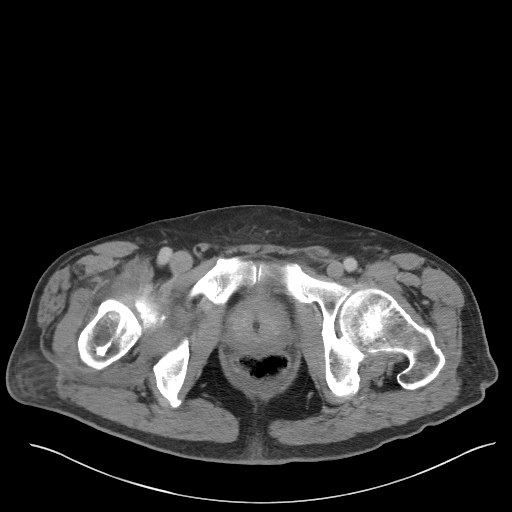
[im 21/99  soft-tissue]
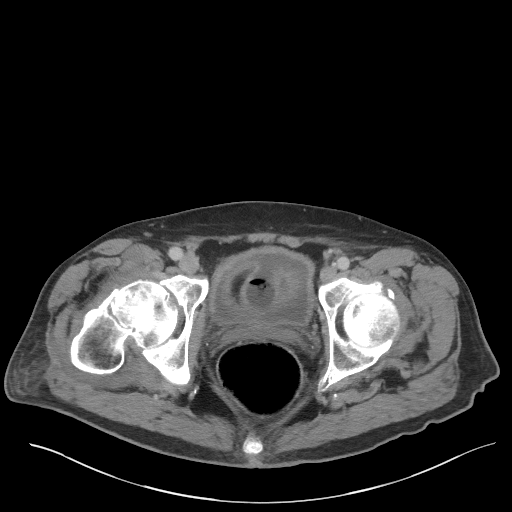
[im 31/99  soft-tissue]
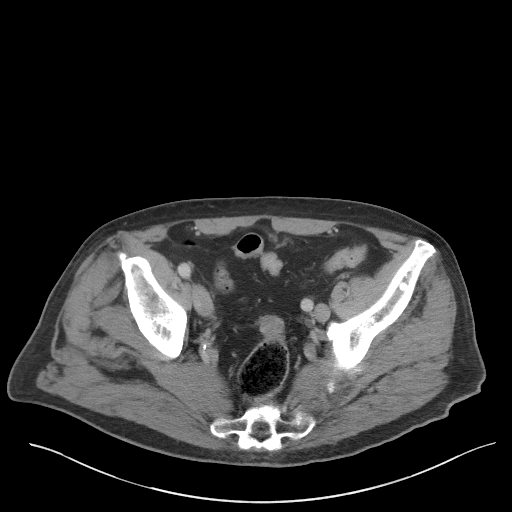
[im 37/99  soft-tissue]
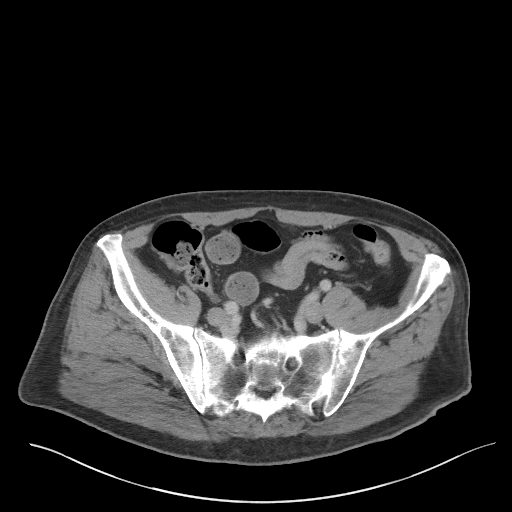
[im 47/99  soft-tissue]
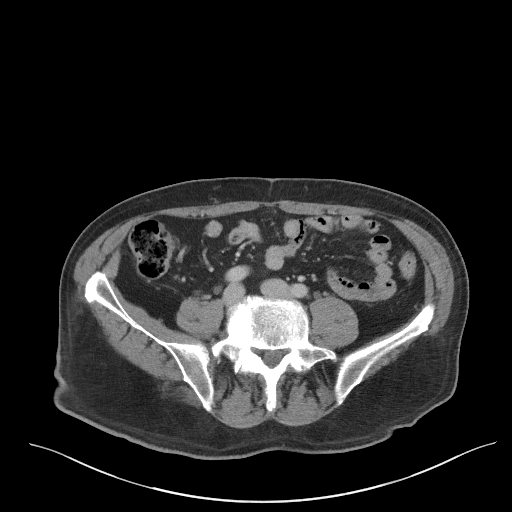
[im 52/99  soft-tissue]
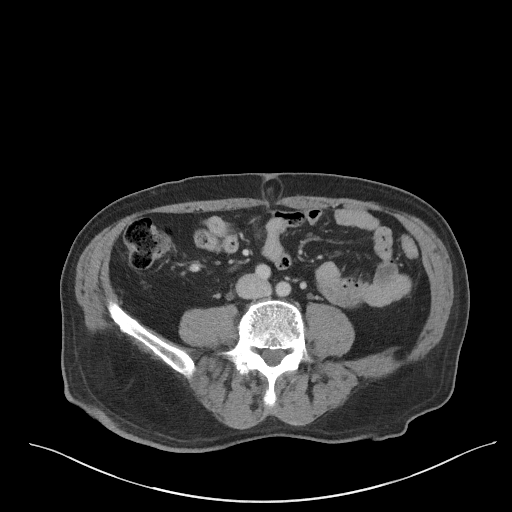
[im 62/99  soft-tissue]
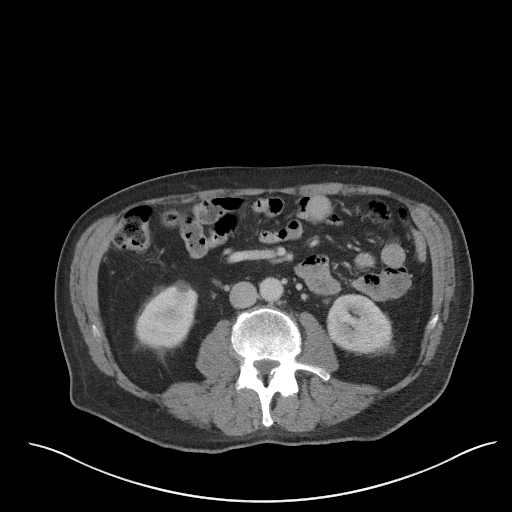
[im 68/99  soft-tissue]
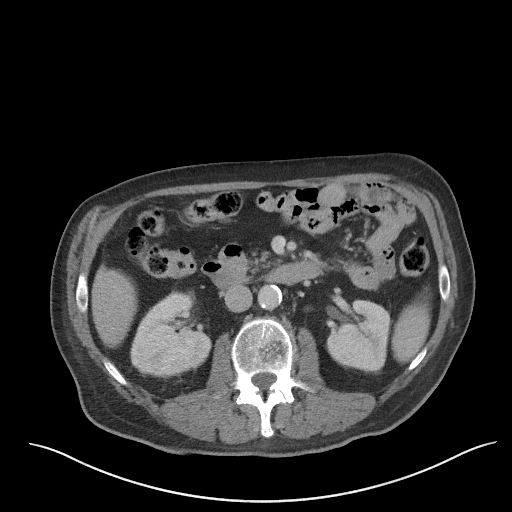
[im 68/99  bone]
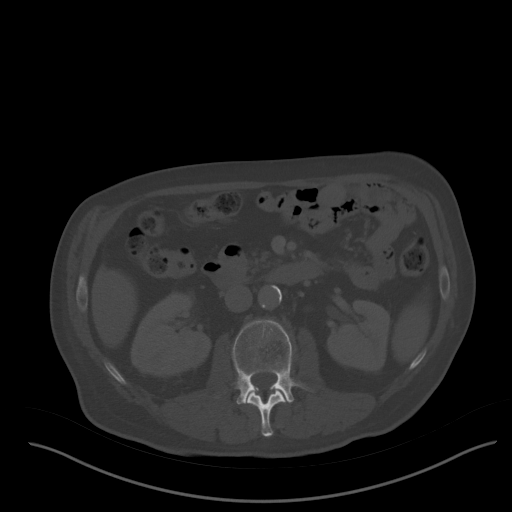
[im 78/99  soft-tissue]
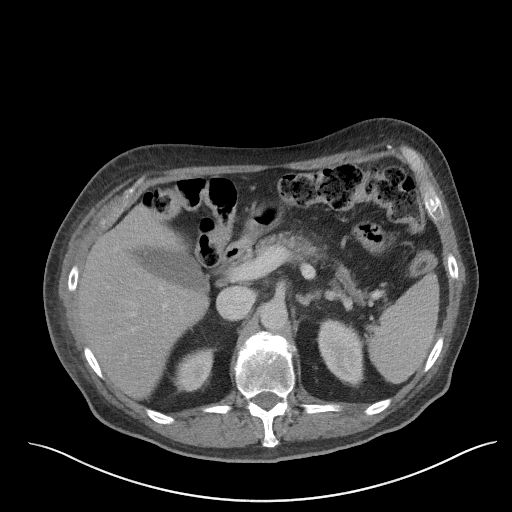
[im 83/99  soft-tissue]
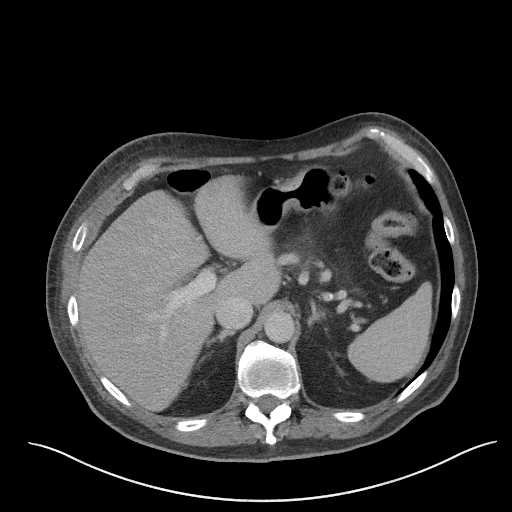
[im 93/99  soft-tissue]
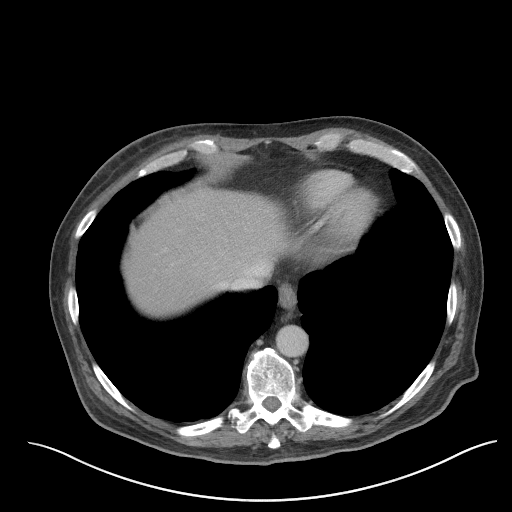

[Series 5: coronal st · coronal · 0.82mm/px · 3 of 102 slices shown]
[im 34/102  soft-tissue]
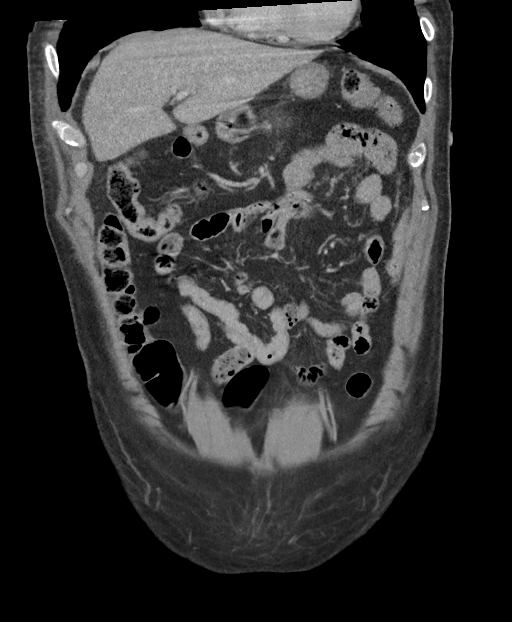
[im 45/102  soft-tissue]
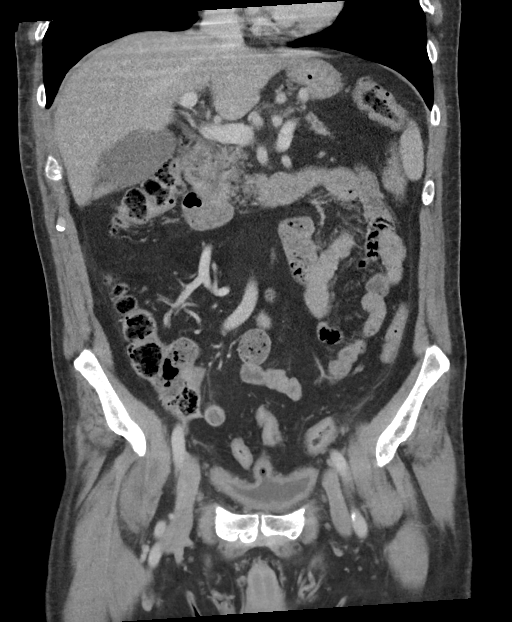
[im 57/102  soft-tissue]
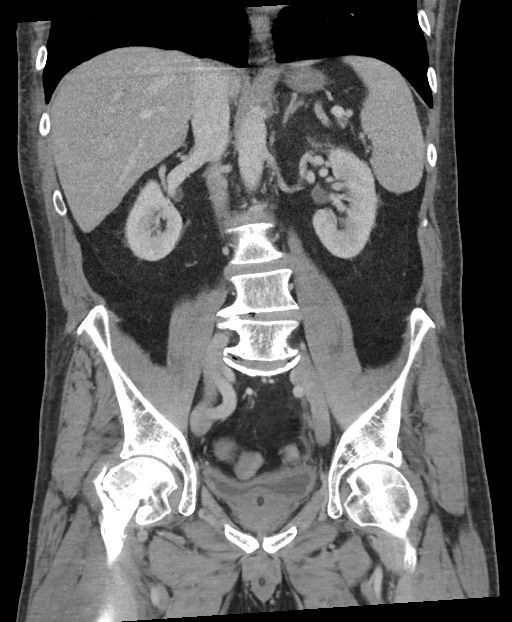

[15 of 46 positions shown; findings below may reference images not displayed]

FINDINGS: Lower chest: No focal pulmonary opacity or pleural effusion. No
pericardial effusion.

Hepatobiliary: Hepatic steatosis. No focal liver abnormality. The
hepatic and portal veins are patent. No intra or extrahepatic
biliary ductal dilatation. A Phrygian cap is noted on an otherwise
normal gallbladder. No gallstones or gallbladder wall thickening.

Pancreas: In the body of the pancreas, there is a 2.0 x 2.7 x 2.0 cm
mass (series 2, image 20 and series 5, image 39), this central low
density and mild adjacent stranding. Pancreas is otherwise
unremarkable.

Spleen: Normal in size without focal abnormality.

Adrenals/Urinary Tract: The adrenal glands are unremarkable. The
kidneys enhance symmetrically with no hydronephrosis. Multiple
low-attenuation lesions in the right greater than left kidney, which
are too small to characterize but most likely renal cysts. The
bladder contains a Foley. Apparent wall thickening is likely
secondary to under distension.

Stomach/Bowel: Stomach is within normal limits. Appendix appears
normal. No evidence of bowel wall thickening, distention, or
inflammatory changes. Diverticulosis without evidence of
diverticulitis.

Vascular/Lymphatic: Aortic atherosclerosis. No enlarged abdominal or
pelvic lymph nodes.

Reproductive: Prostatomegaly.

Other: No abdominal wall hernia or abnormality. No abdominopelvic
ascites.

Musculoskeletal: Right subcapital femoral neck fracture (series 2,
image 78), moderately impacted. Degenerative changes in the lumbar
spine with approximately 40% height loss in the anterior aspect of
L1, which appears chronic. Bilateral L5-S1 assimilation joints.
IMPRESSION: 1. 2.7 cm mass in the body of the pancreas, with central low
density, which may represent a neoplasm or focal collection.
Adjacent surrounding suggest inflammatory process. This may
represent neoplasm with superimposed acute pancreatitis. Correlate
with lipase. An MRI pancreatic protocol is recommended.
2. Right subcapital femoral neck fracture, moderately impacted, as
seen on the same-day radiograph.
3. Chronic appearing compression deformity of L1, although no prior
spine imaging is available. Correlate with point tenderness. If
there is concern that this fracture may be acute, consider MRI.
4. Wall thickening in the bladder, which may be secondary to
underdistension, given the presence of a Foley.

These results were called by telephone at the time of interpretation
on 05/20/2021 at [DATE] to provider HLAM DIM , who verbally
acknowledged these results.

## 2022-04-21 IMAGING — CT CT CERVICAL SPINE W/O CM
3 of 4 series · 12 of 33 positions shown, 14 images · non-contrast
Comparison: 4747

CLINICAL DATA: Neck trauma (Age >= 65y)

EXAM:
CT CERVICAL SPINE WITHOUT CONTRAST
TECHNIQUE: Multidetector CT imaging of the cervical spine was performed without
intravenous contrast. Multiplanar CT image reconstructions were also
generated.

[Series 6: orthogonal bone · axial · 0.29mm/px · z∈[-206,-62]mm · 4 of 115 slices shown, 5 images]
[im 20/115  soft-tissue]
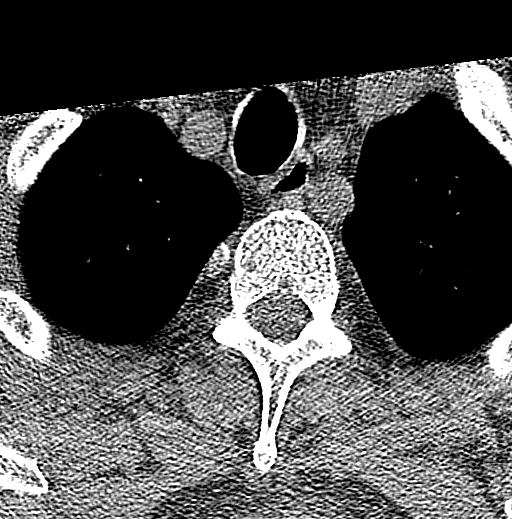
[im 20/115  bone]
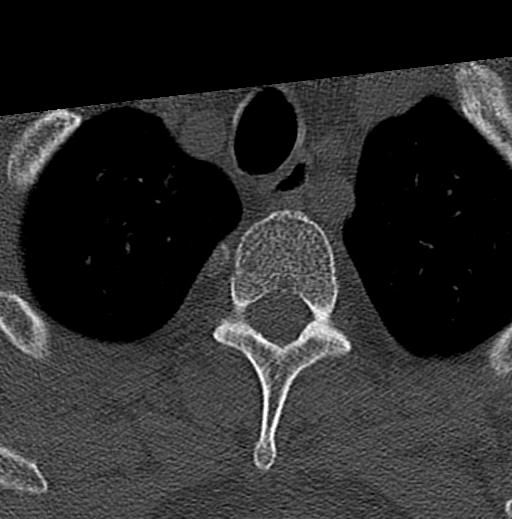
[im 39/115  bone]
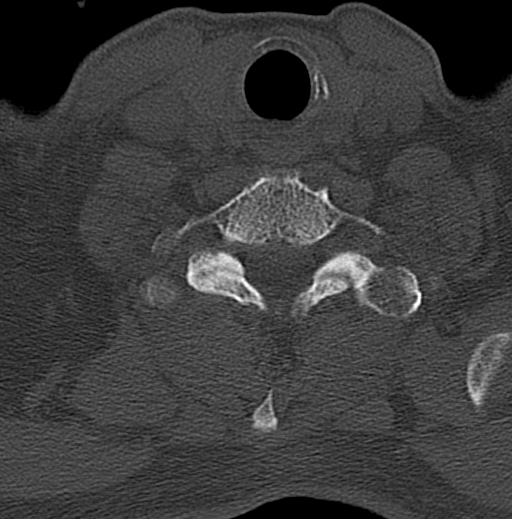
[im 77/115  bone]
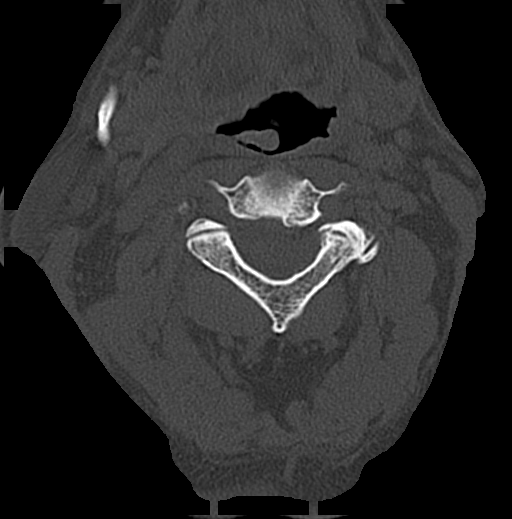
[im 96/115  bone]
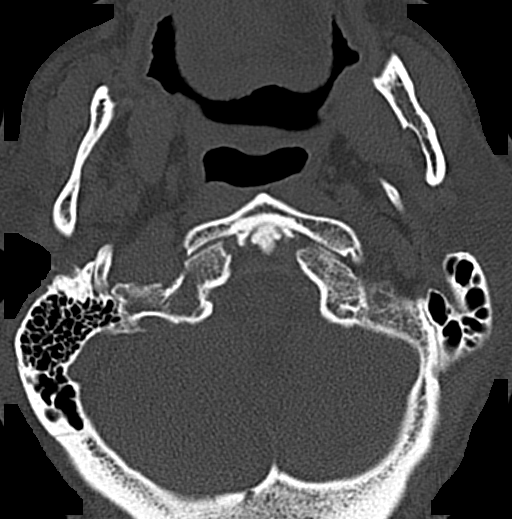

[Series 7: sagittal bone · sagittal · 0.30mm/px · 5 of 69 slices shown, 6 images]
[im 23/69  bone]
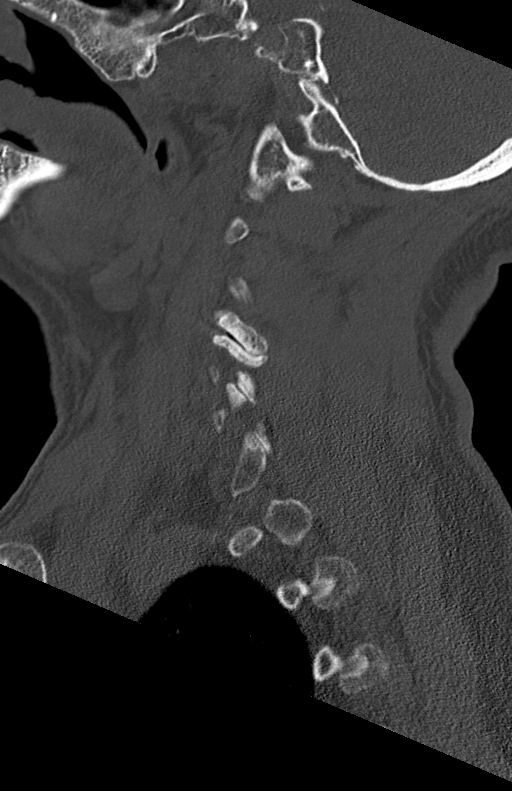
[im 29/69  bone]
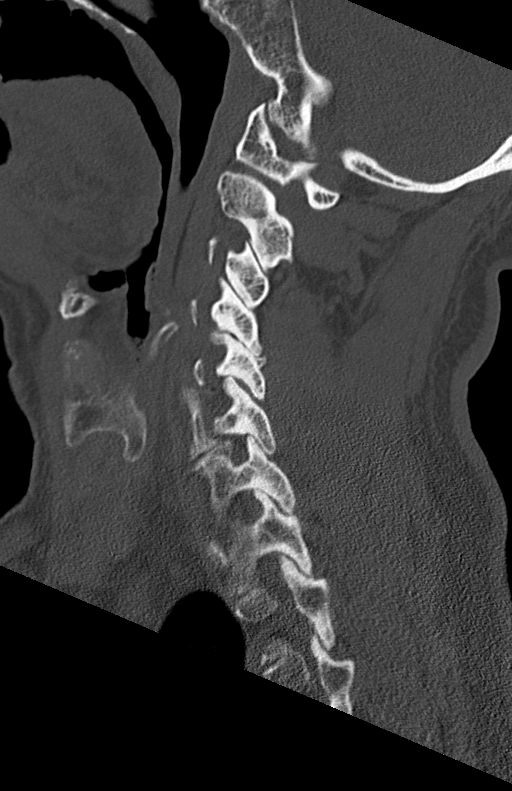
[im 35/69  soft-tissue]
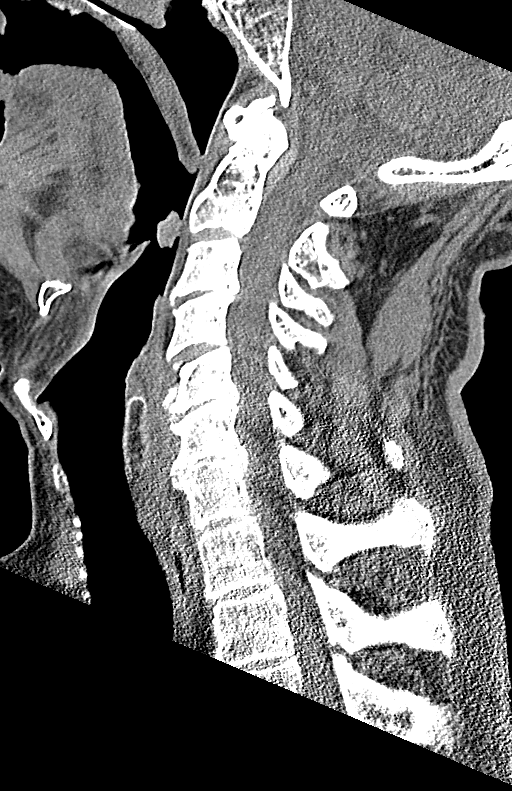
[im 35/69  bone]
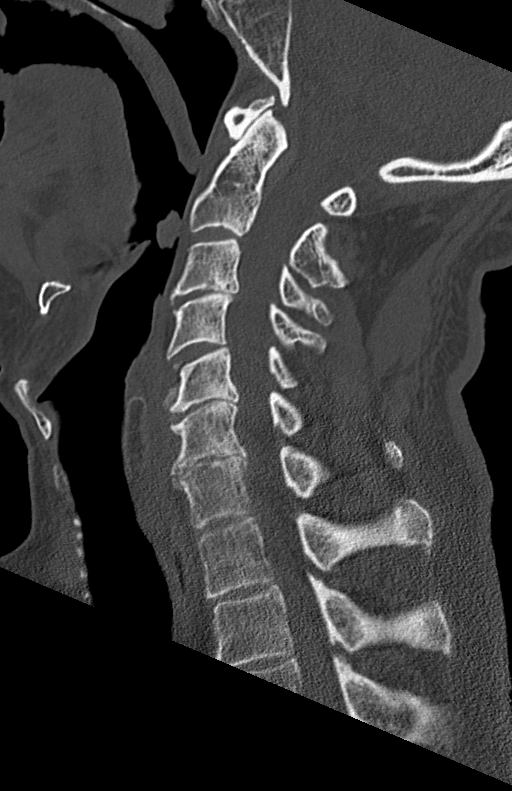
[im 40/69  bone]
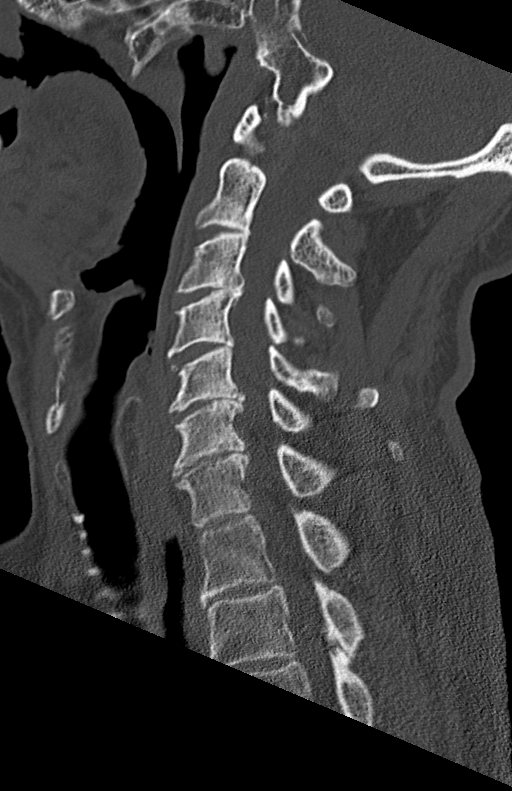
[im 46/69  bone]
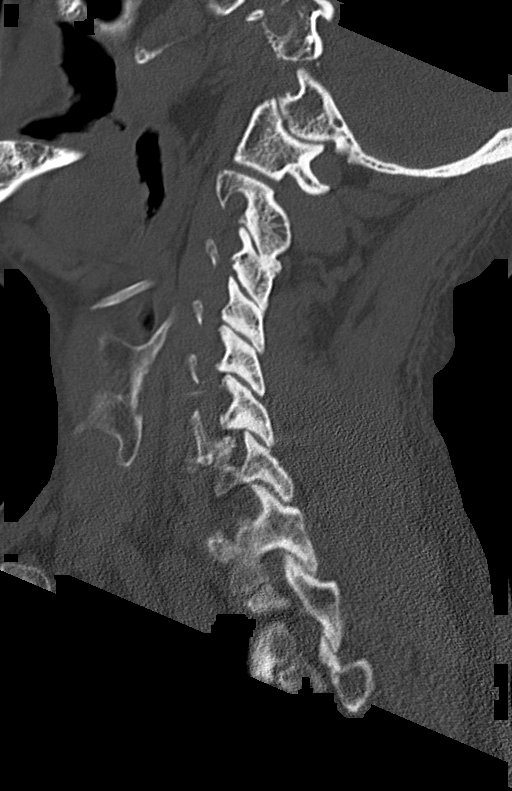

[Series 8: coronal bone · coronal · 0.29mm/px · 3 of 78 slices shown]
[im 21/78  bone]
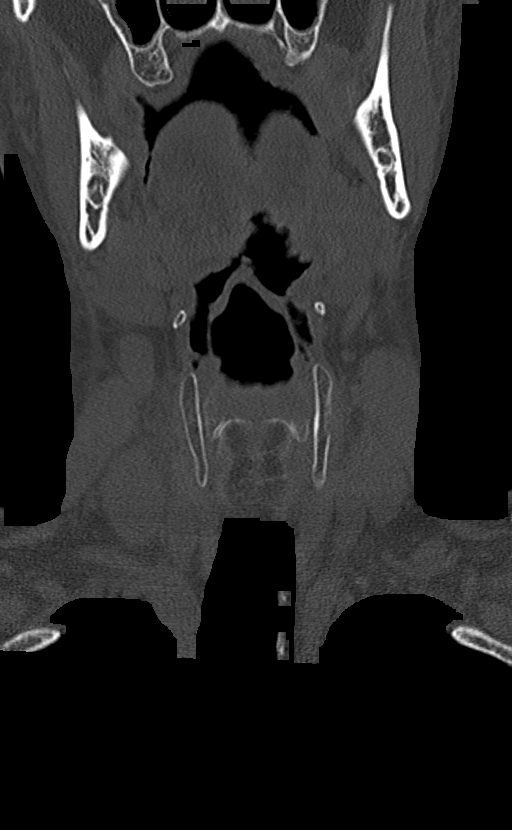
[im 33/78  bone]
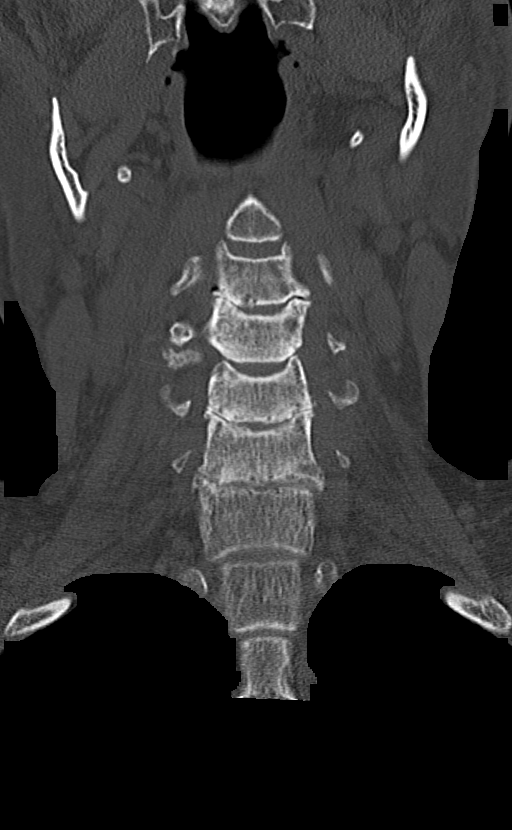
[im 45/78  bone]
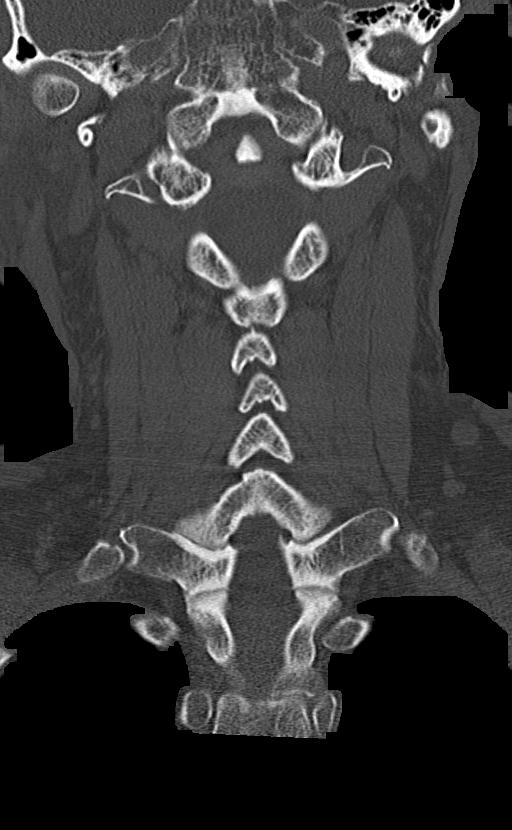

[12 of 33 positions shown; findings below may reference images not displayed]

FINDINGS: Alignment: Stable.

Skull base and vertebrae: Stable vertebral body heights. No acute
fracture.

Soft tissues and spinal canal: No prevertebral fluid or swelling. No
visible canal hematoma.

Disc levels: Multilevel degenerative changes are present including
disc space narrowing, endplate osteophytes, and facet and
uncovertebral hypertrophy.

Upper chest: No apical lung mass. A small nodule at the left lung
apex was present in 4747 and is therefore benign.

Other: Exophytic or adherent tissue within the right oropharynx
(series 3, image 44) extending toward the piriformis sinus.
IMPRESSION: No acute cervical spine fracture.

Exophytic or adherent tissue within the right oropharynx. While a
mass is difficult to exclude, this is favored to represent small
volume retained ingested material.

## 2022-04-21 IMAGING — CR DG HIP (WITH OR WITHOUT PELVIS) 2-3V*R*
3 series · 3 of 3 positions shown · non-contrast
Comparison: None.

CLINICAL DATA: Right hip pain after fall, abrasions

EXAM:
DG HIP (WITH OR WITHOUT PELVIS) 2-3V RIGHT

[pelvis ap]
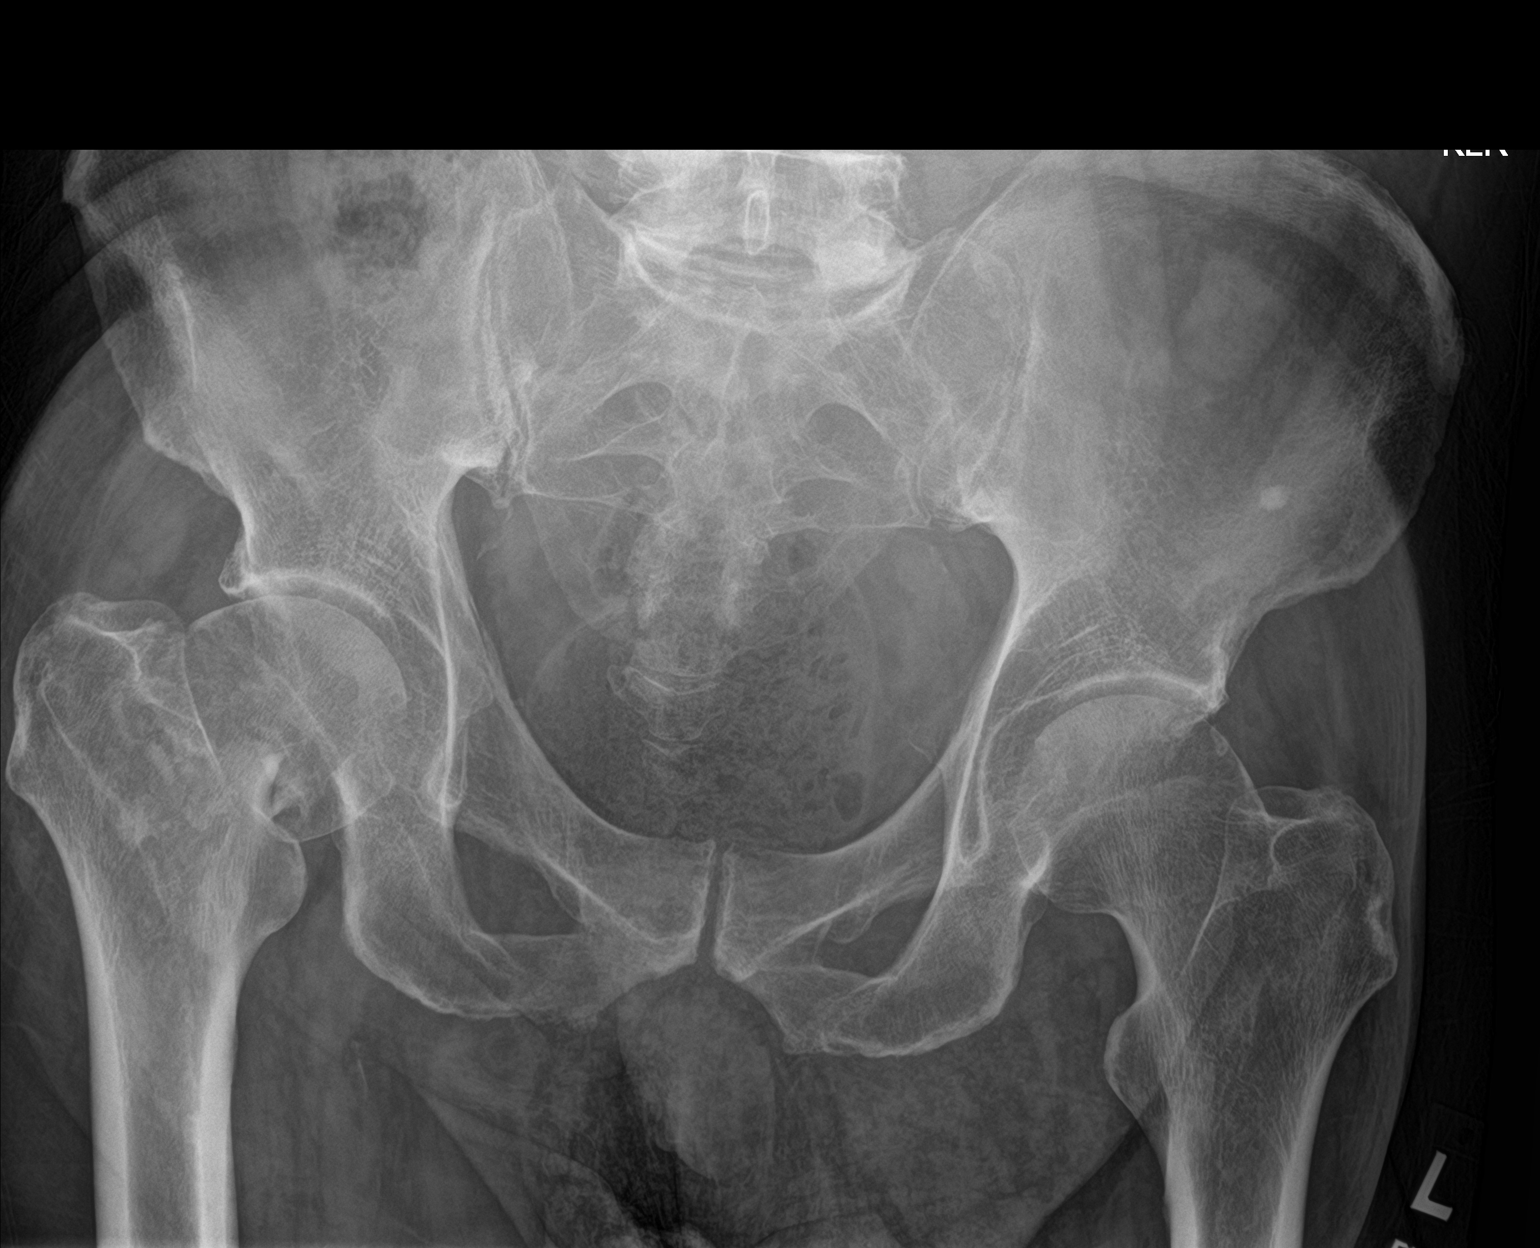

[hip ap]
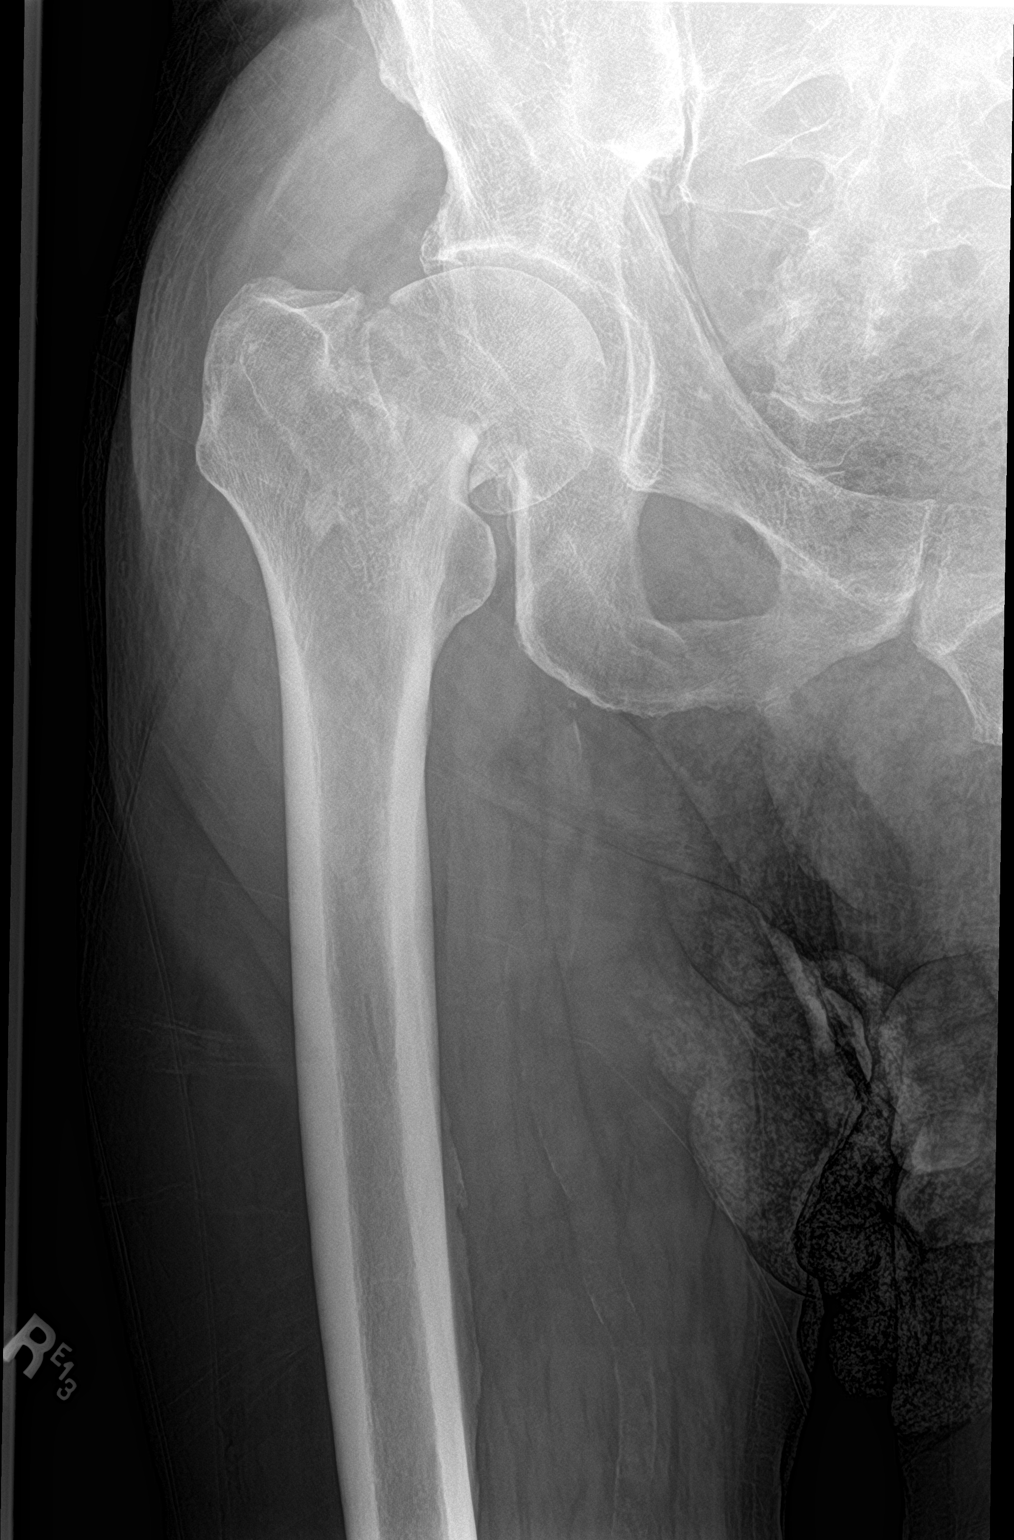

[hip lat]
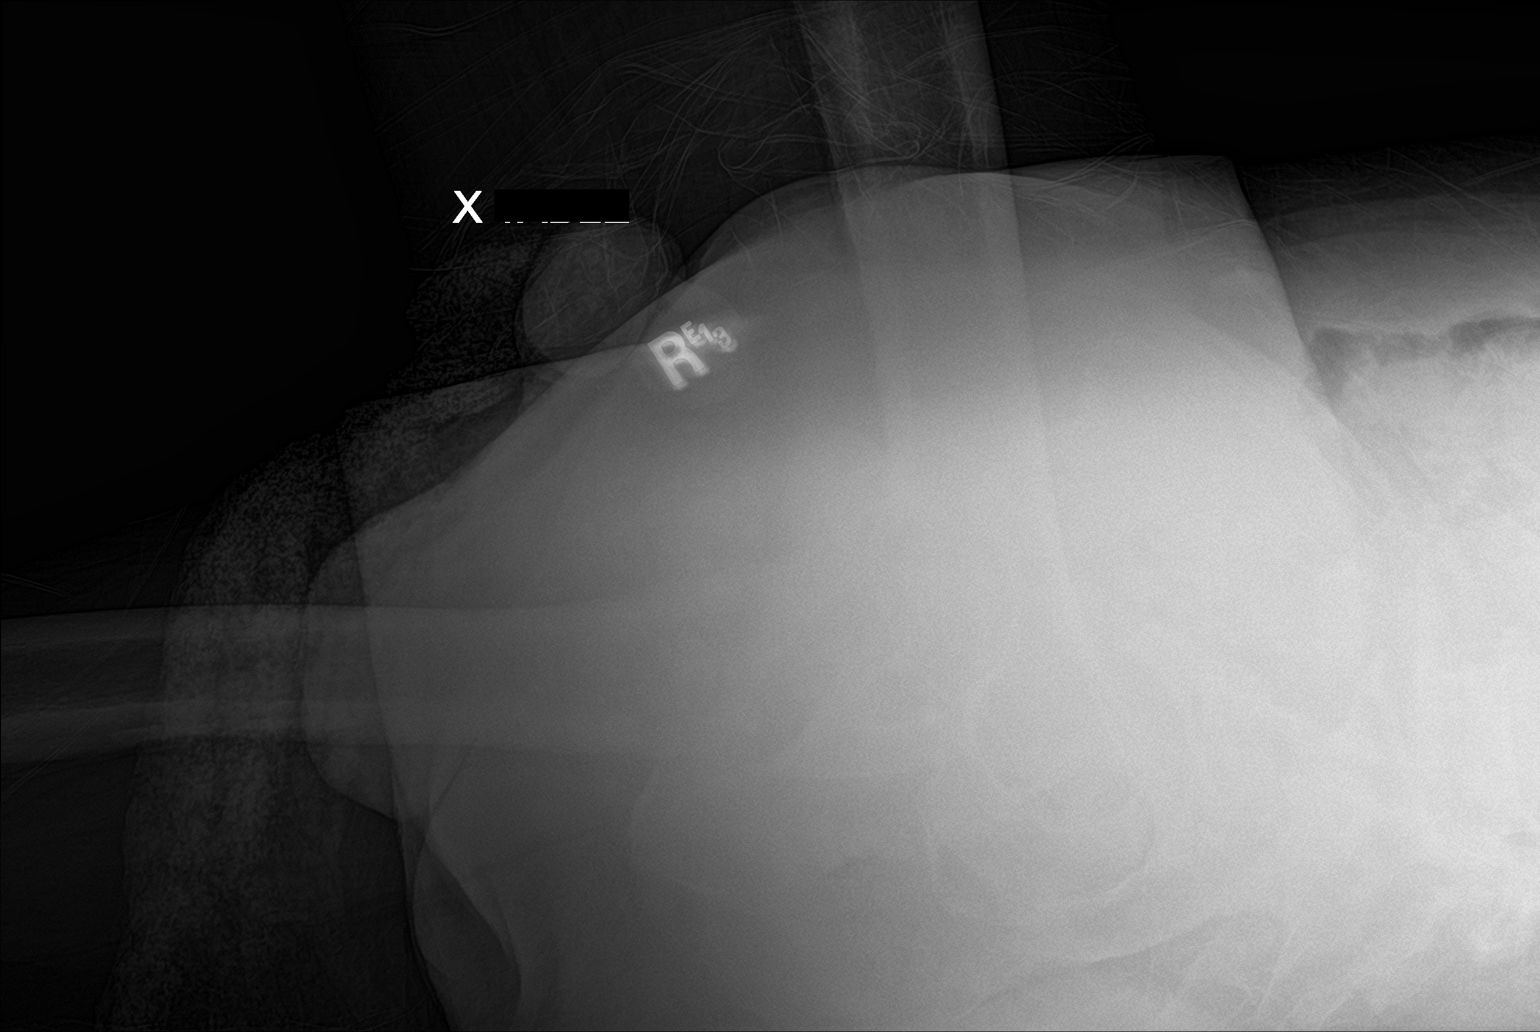

[3 of 3 positions shown; findings below may reference images not displayed]

FINDINGS: Frontal view of the pelvis as well as frontal and cross-table
lateral views of the right hip are obtained. There is an impacted
subcapital right femoral neck fracture. No evidence of dislocation.
Left hip is unremarkable. Remainder of the bony pelvis is normal.
IMPRESSION: 1. Impacted subcapital right femoral neck fracture.

## 2022-04-21 IMAGING — CR DG CHEST 1V
2 series · 2 of 2 positions shown · non-contrast
Comparison: 11/14/2018

CLINICAL DATA: Fall, found down

EXAM:
CHEST  1 VIEW

[chest ap (1 of 2)]
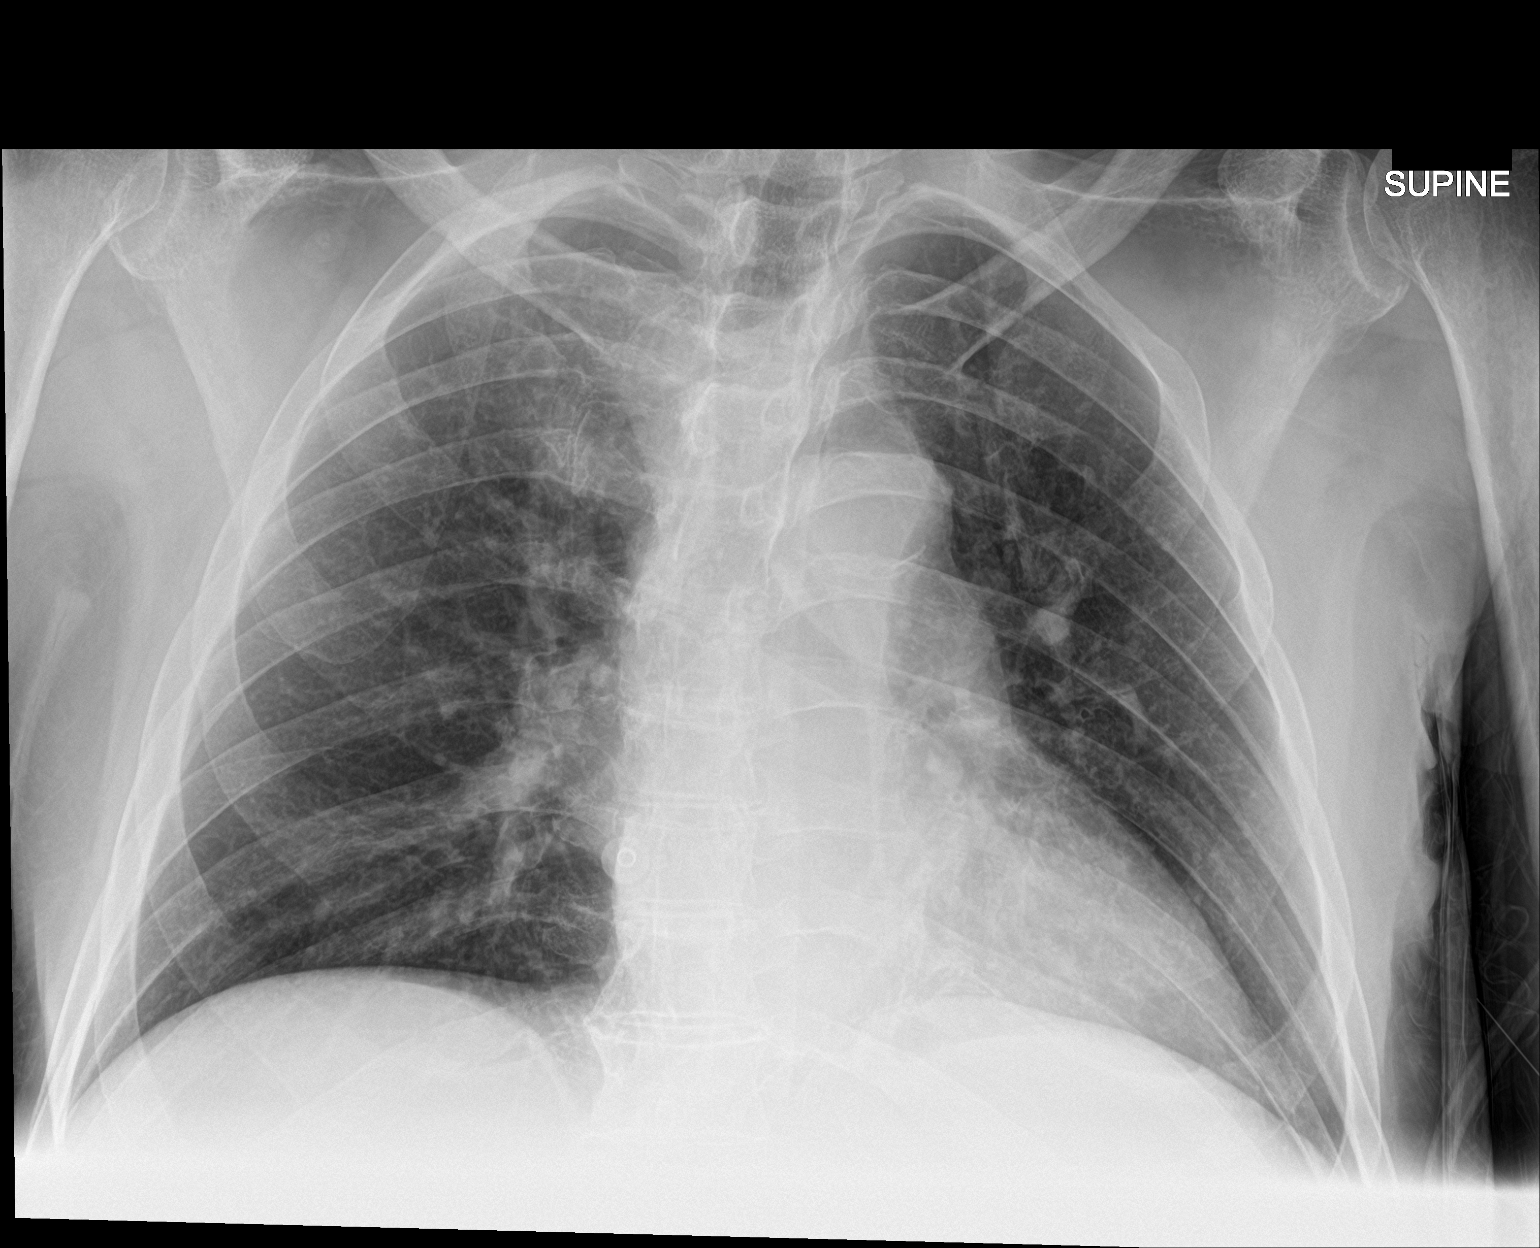

[chest ap (2 of 2)]
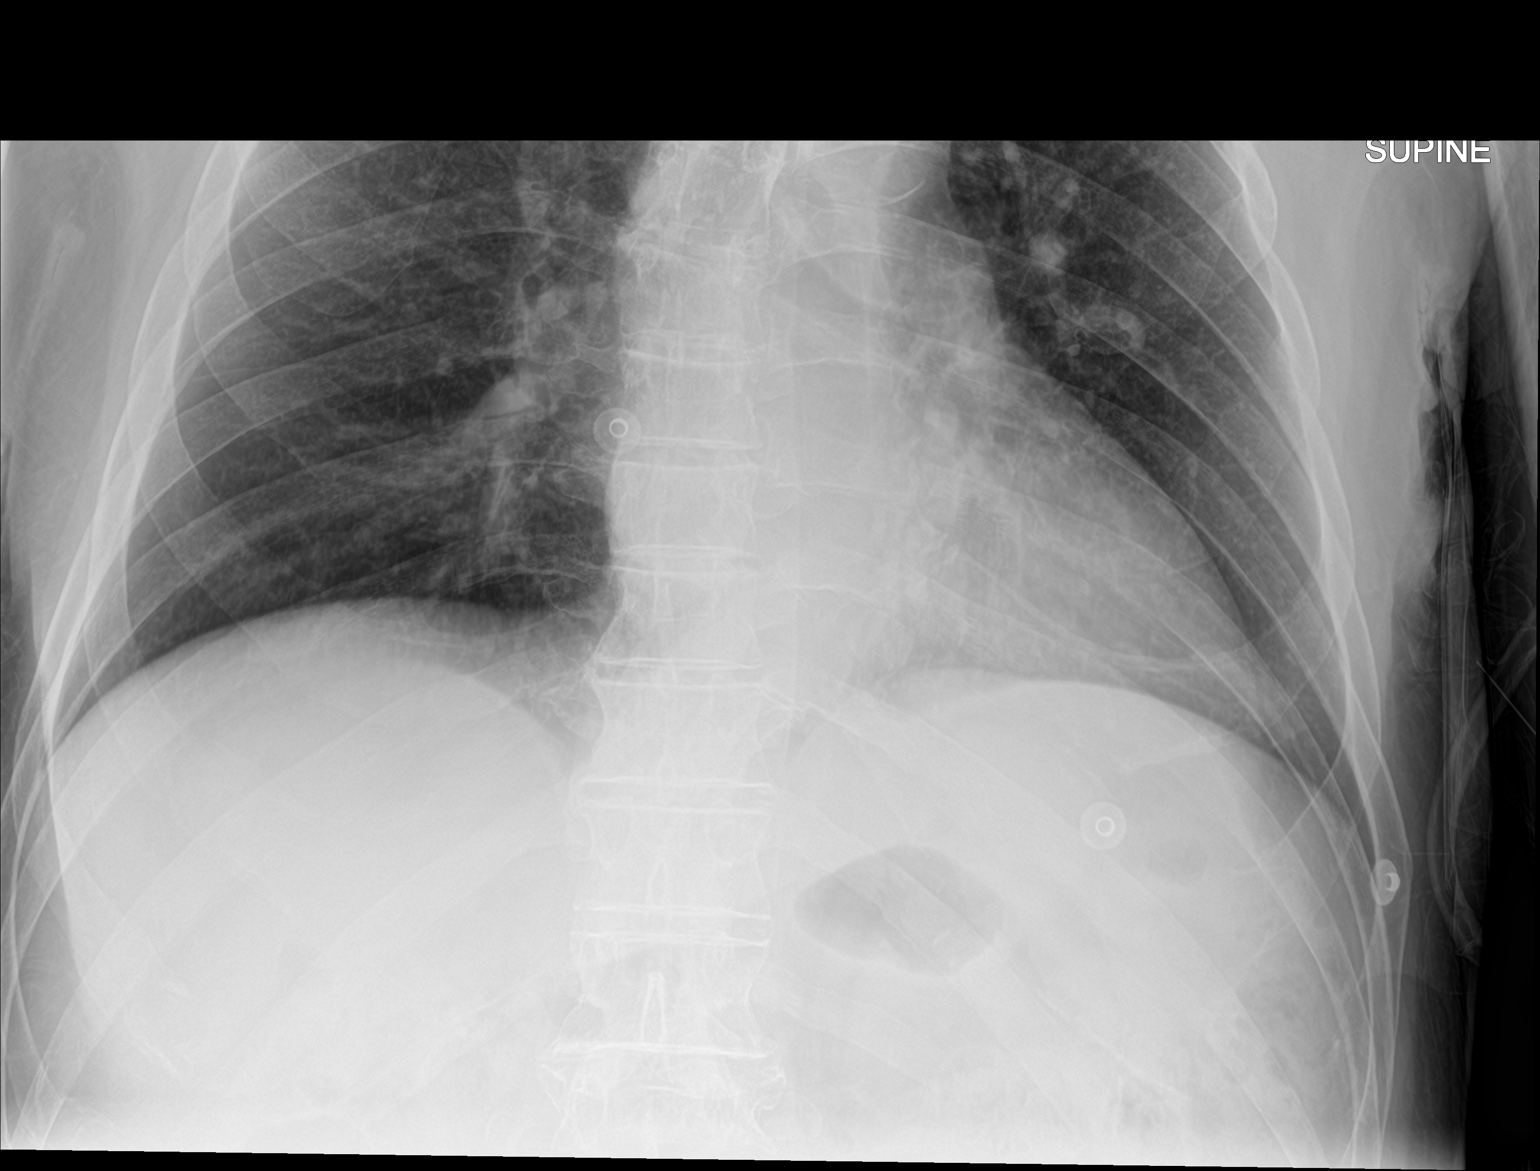

[2 of 2 positions shown; findings below may reference images not displayed]

FINDINGS: Mild cardiomegaly. Both lungs are clear. The visualized skeletal
structures are unremarkable.
IMPRESSION: Mild cardiomegaly without acute abnormality of the lungs in AP
portable projection.

## 2022-04-21 IMAGING — CT CT HEAD W/O CM
4 series · 17 of 47 positions shown, 19 images · non-contrast
Comparison: 05/20/2021

CLINICAL DATA: Un witnessed fall with headaches, initial encounter

EXAM:
CT HEAD WITHOUT CONTRAST
TECHNIQUE: Contiguous axial images were obtained from the base of the skull
through the vertex without intravenous contrast.

[Series 2: head wo · axial · 0.44mm/px · z∈[-225,-95]mm · 7 of 36 slices shown, 9 images]
[im 5/36  brain]
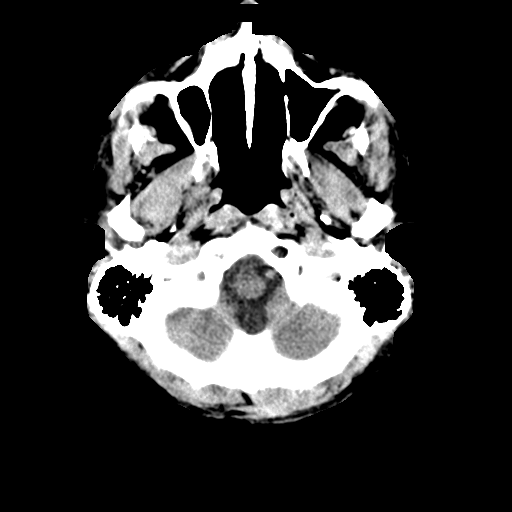
[im 5/36  bone]
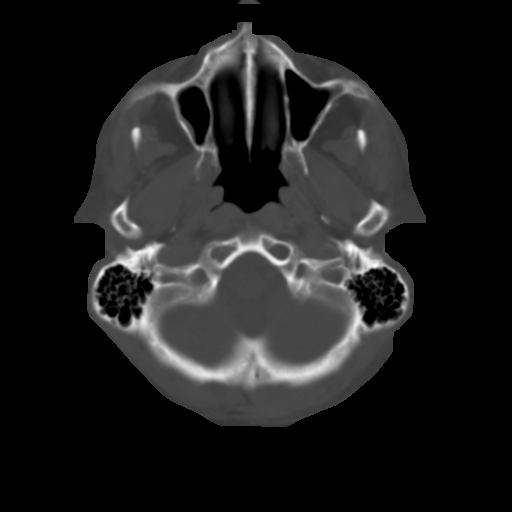
[im 9/36  brain]
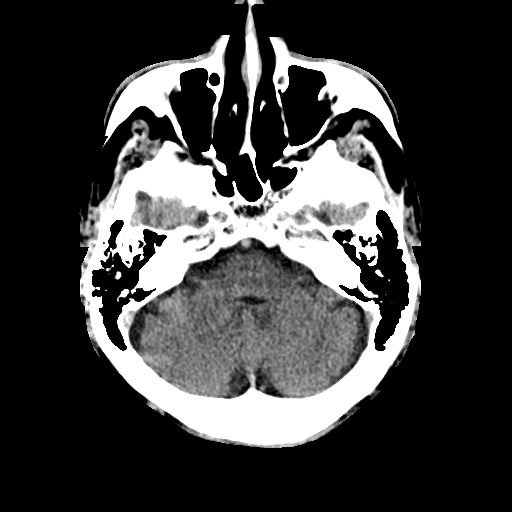
[im 14/36  brain]
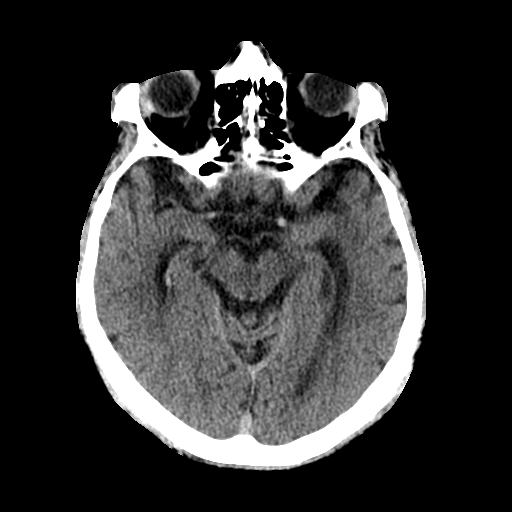
[im 18/36  brain]
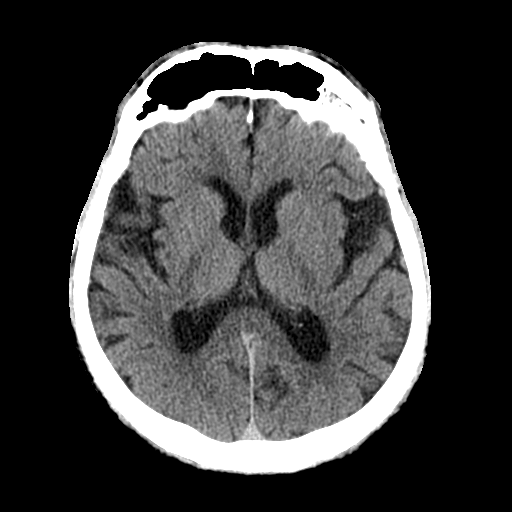
[im 22/36  brain]
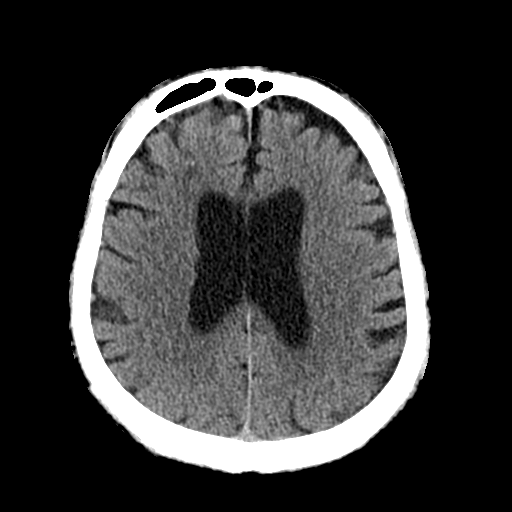
[im 22/36  bone]
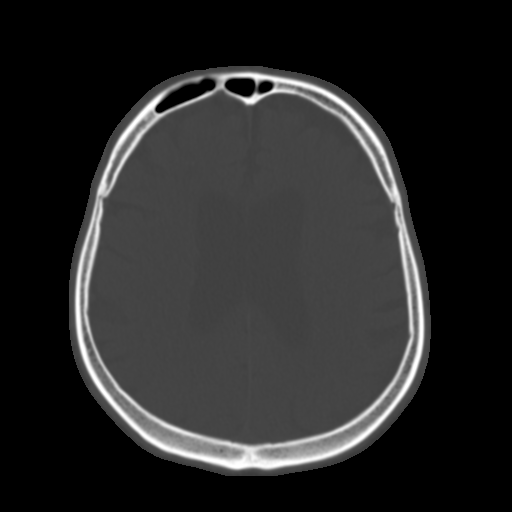
[im 27/36  brain]
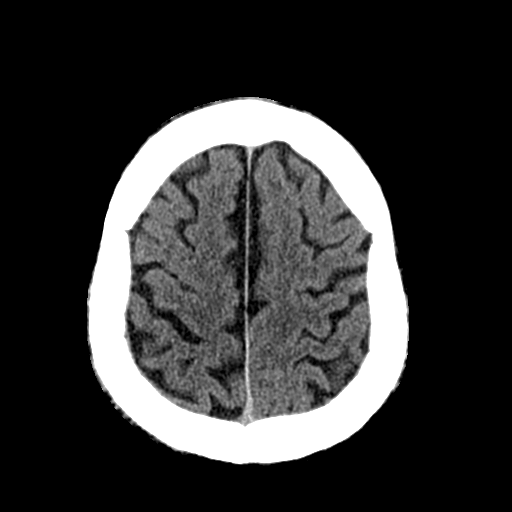
[im 31/36  brain]
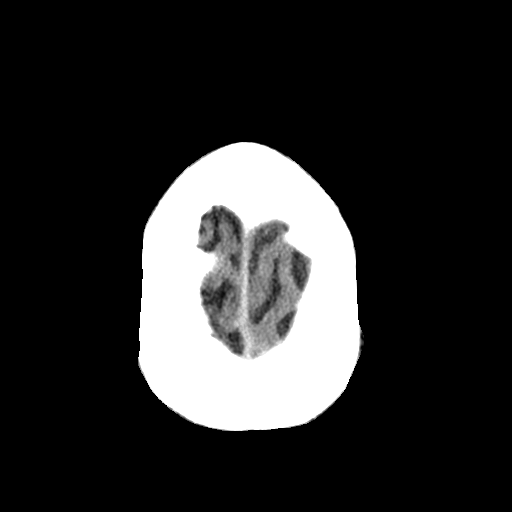

[Series 3: head bone · axial · 0.44mm/px · z∈[-229,-167]mm · 4 of 89 slices shown]
[im 9/89  bone]
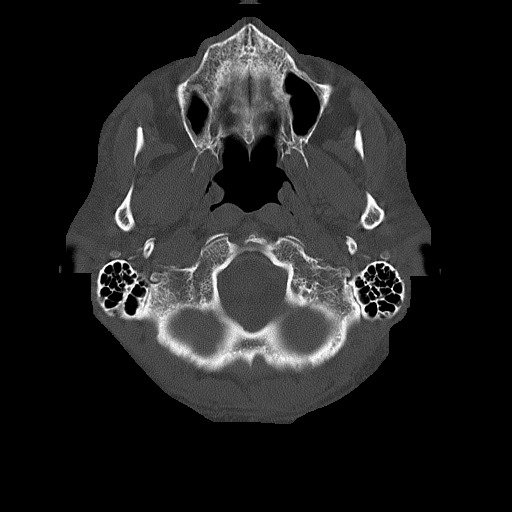
[im 18/89  bone]
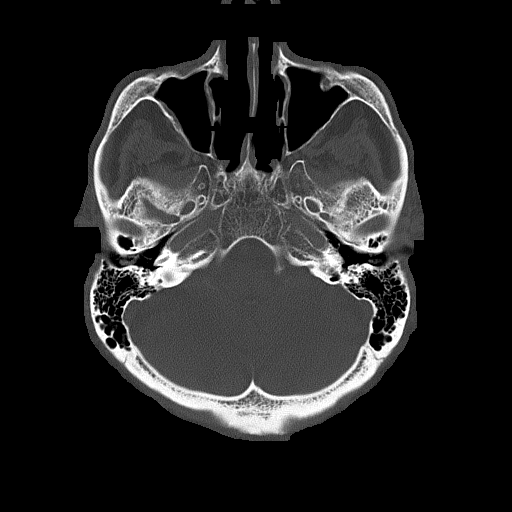
[im 27/89  bone]
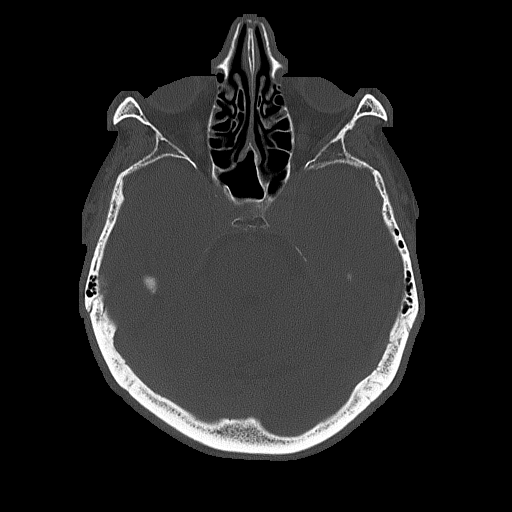
[im 40/89  bone]
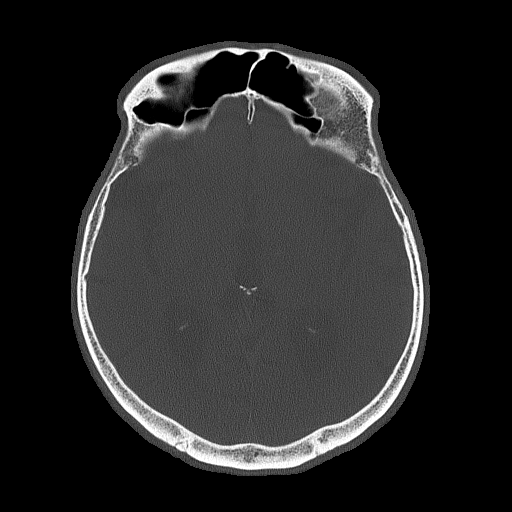

[Series 4: coronal soft tissue · coronal · 0.38mm/px · 3 of 74 slices shown]
[im 25/74  brain]
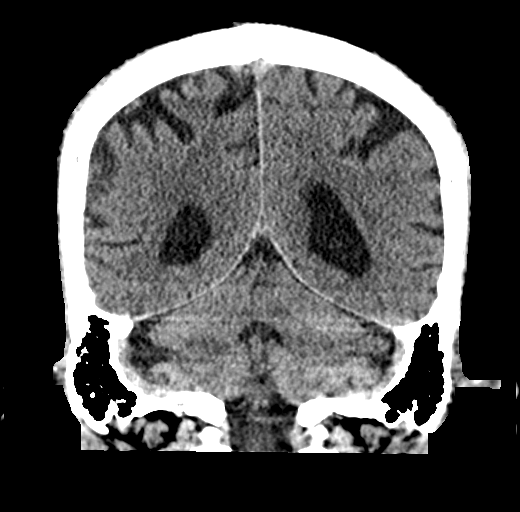
[im 33/74  brain]
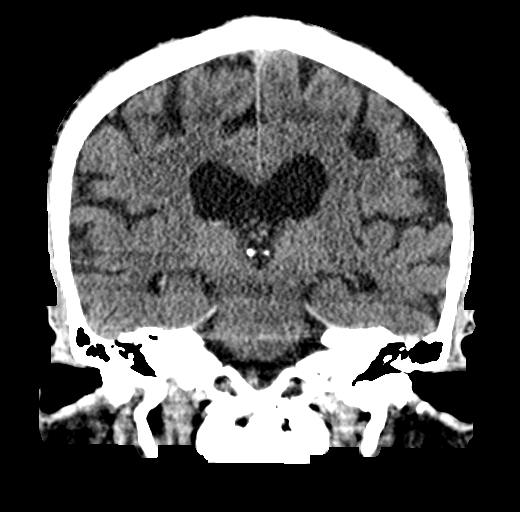
[im 41/74  brain]
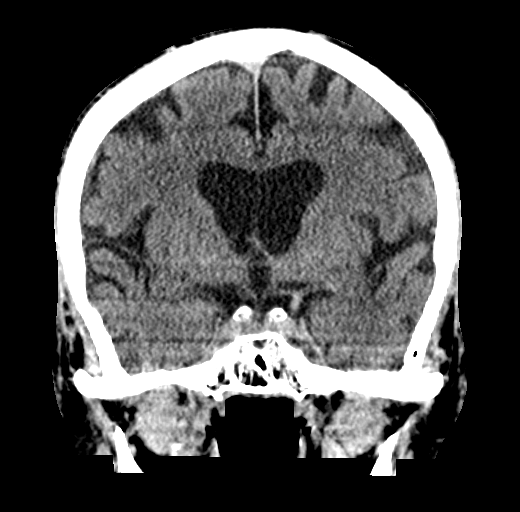

[Series 5: sagittal soft tissue · sagittal · 0.39mm/px · 3 of 65 slices shown]
[im 22/65  brain]
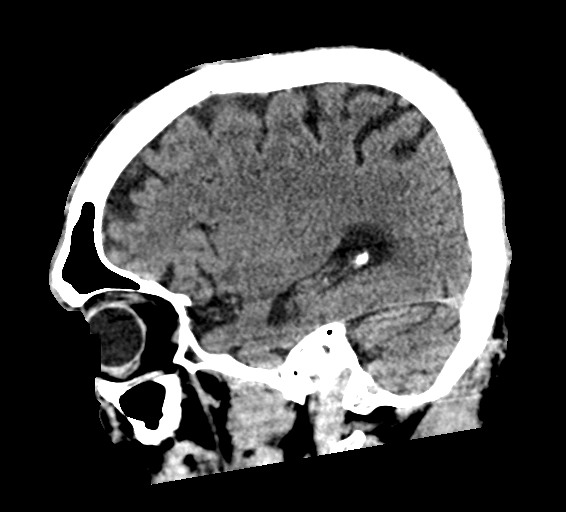
[im 33/65  brain]
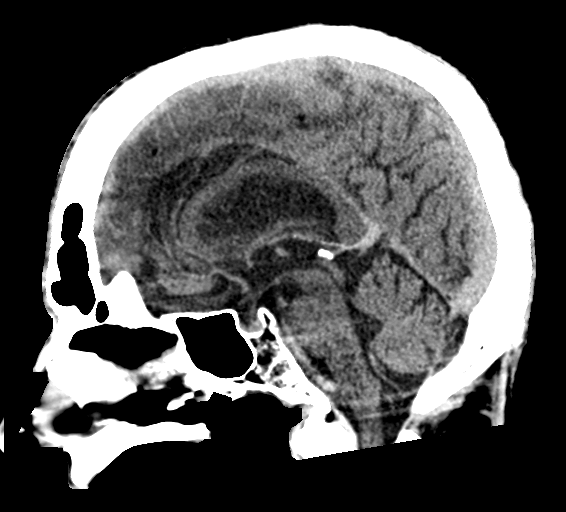
[im 43/65  brain]
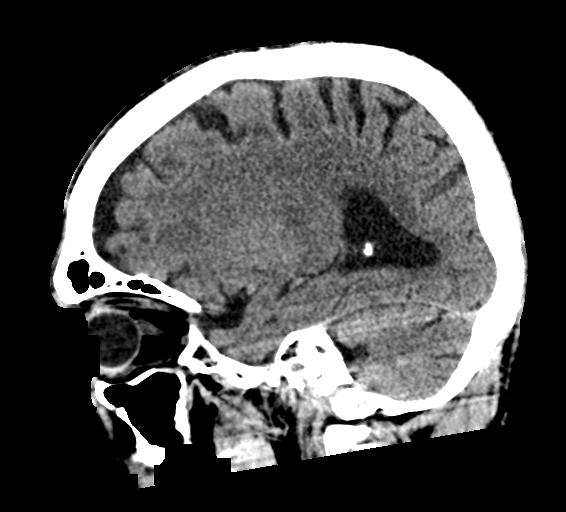

[17 of 47 positions shown; findings below may reference images not displayed]

FINDINGS: Brain: No evidence of acute infarction, hemorrhage, hydrocephalus,
extra-axial collection or mass lesion/mass effect. Chronic atrophic
and ischemic changes are noted stable in appearance from the prior
exam.

Vascular: No hyperdense vessel or unexpected calcification.

Skull: Normal. Negative for fracture or focal lesion.

Sinuses/Orbits: No acute finding.

Other: None.
IMPRESSION: Chronic atrophic and ischemic changes stable from the recent exam.
No acute abnormality noted.

## 2022-04-21 IMAGING — CT CT HEAD W/O CM
4 series · 17 of 47 positions shown, 19 images · non-contrast
Comparison: CT head 11/14/2018

CLINICAL DATA: Head trauma, minor (Age >= 65y)

EXAM:
CT HEAD WITHOUT CONTRAST
TECHNIQUE: Contiguous axial images were obtained from the base of the skull
through the vertex without intravenous contrast.

[Series 2: head wo · axial · 0.46mm/px · z∈[-15,+110]mm · 7 of 35 slices shown, 9 images]
[im 5/35  brain]
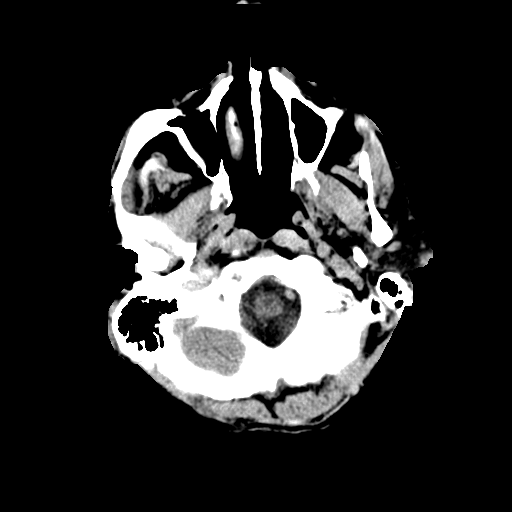
[im 5/35  bone]
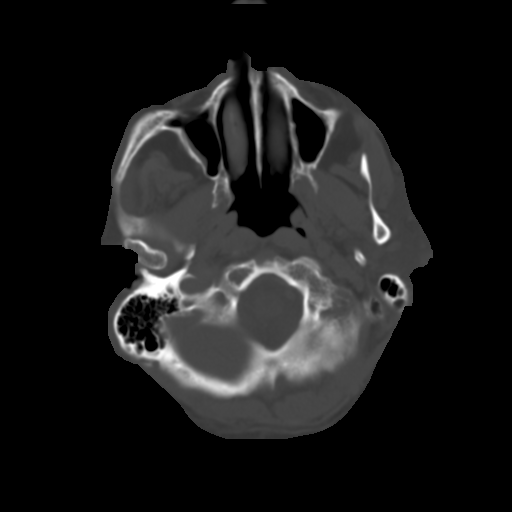
[im 9/35  brain]
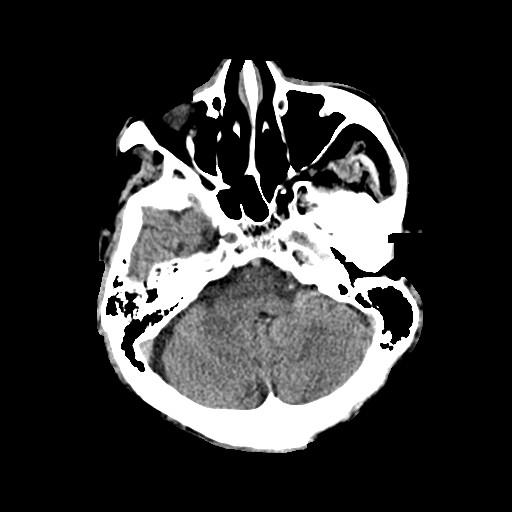
[im 13/35  brain]
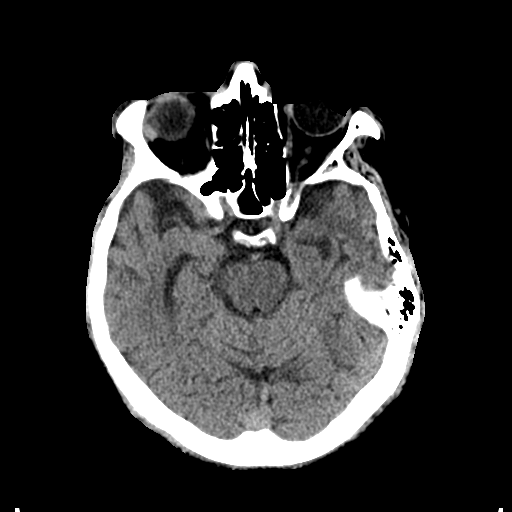
[im 18/35  brain]
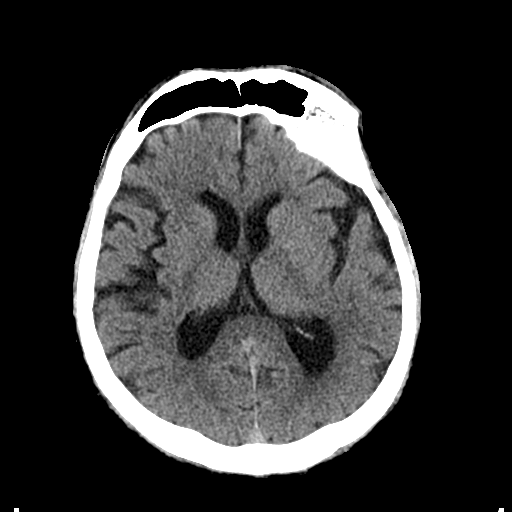
[im 22/35  brain]
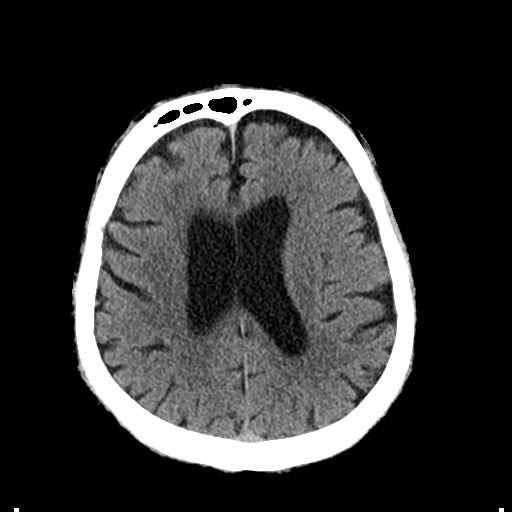
[im 22/35  bone]
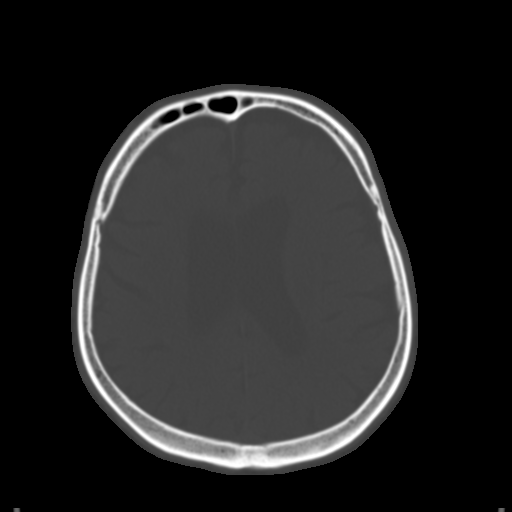
[im 26/35  brain]
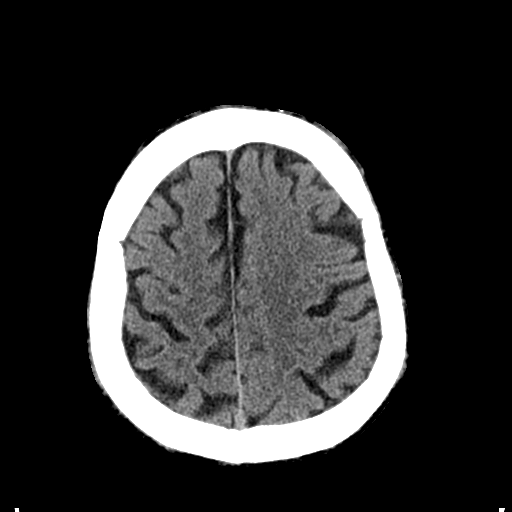
[im 30/35  brain]
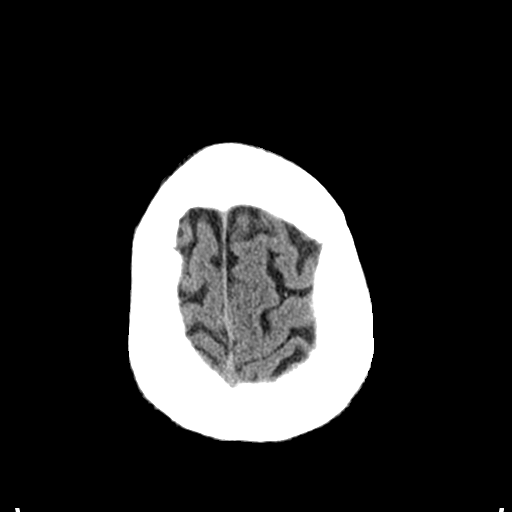

[Series 3: head bone · axial · 0.46mm/px · z∈[-19,+41]mm · 4 of 87 slices shown]
[im 9/87  bone]
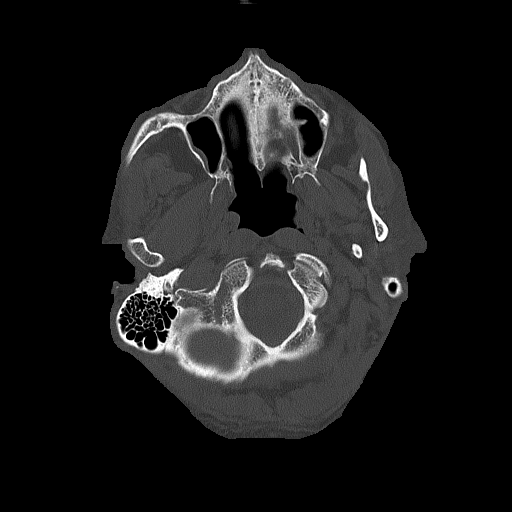
[im 18/87  bone]
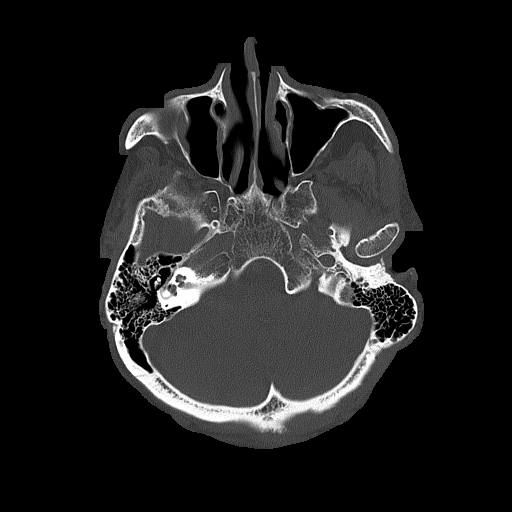
[im 26/87  bone]
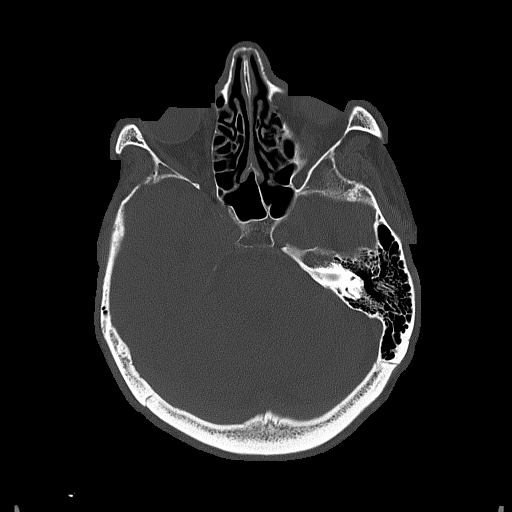
[im 39/87  bone]
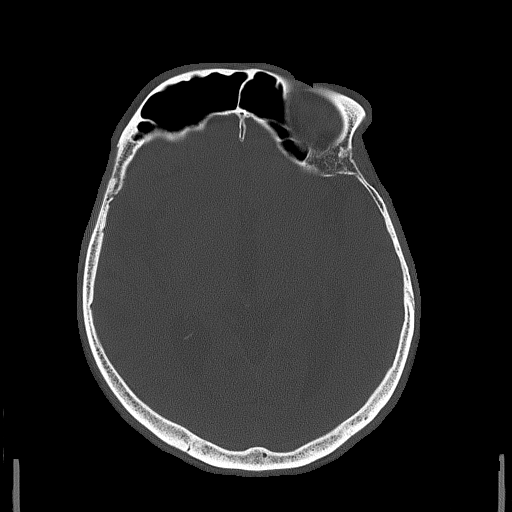

[Series 4: coronal soft tissue · coronal · 0.36mm/px · 3 of 75 slices shown]
[im 25/75  brain]
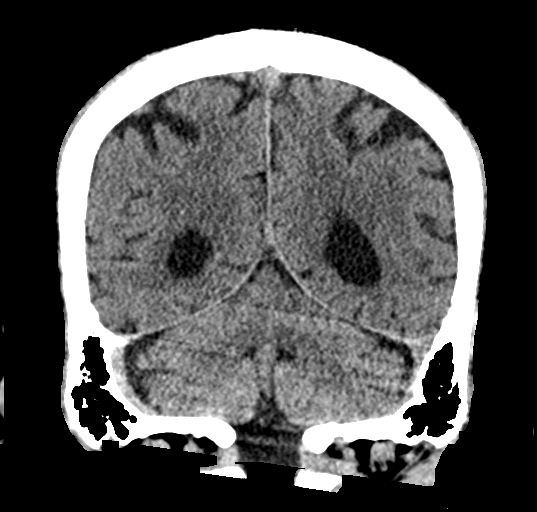
[im 33/75  brain]
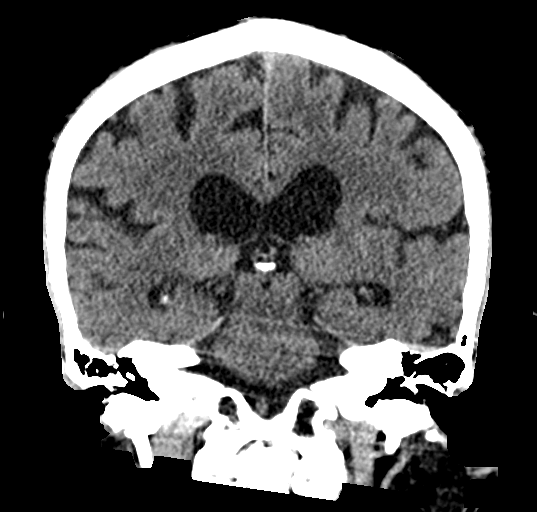
[im 42/75  brain]
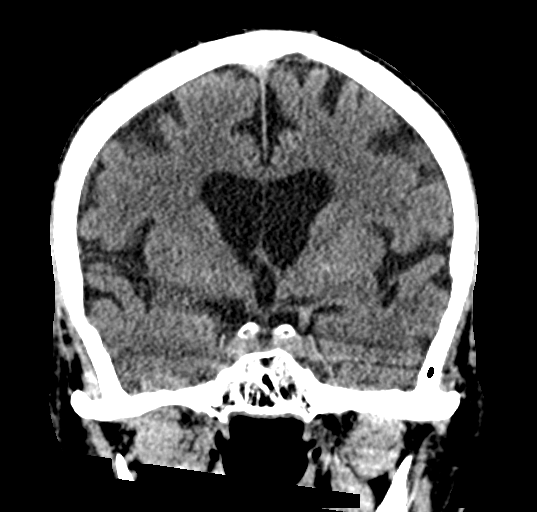

[Series 5: sagittal soft tissue · sagittal · 0.38mm/px · 3 of 64 slices shown]
[im 25/64  brain]
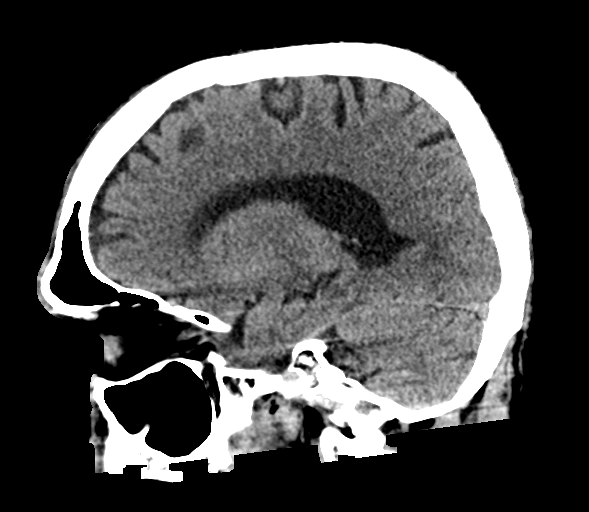
[im 32/64  brain]
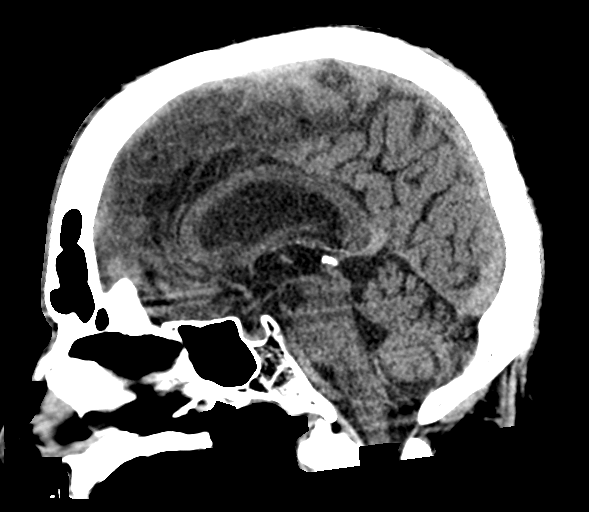
[im 40/64  brain]
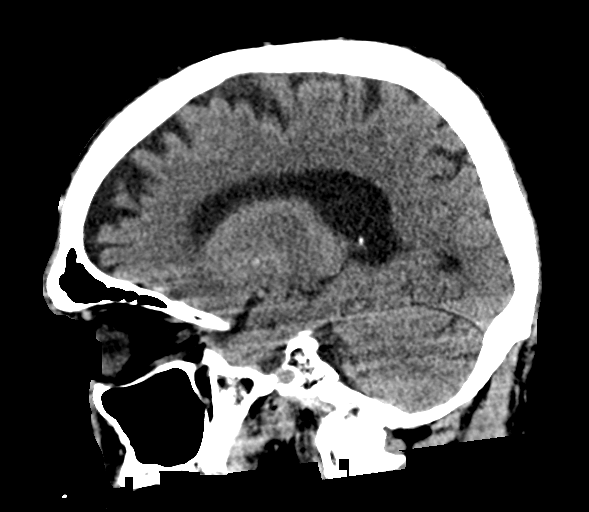

[17 of 47 positions shown; findings below may reference images not displayed]

FINDINGS: Brain: There is no acute intracranial hemorrhage, mass effect, or
edema. Gray-white differentiation is preserved. There is no
extra-axial fluid collection. Prominence of the ventricles and sulci
reflects similar parenchymal volume loss. Minimal patchy low density
in the supratentorial white matter is nonspecific but may reflect
minor chronic microvascular ischemic changes.

Vascular: No hyperdense vessel or unexpected calcification.

Skull: Calvarium is unremarkable.

Sinuses/Orbits: Mild mucosal thickening.  Orbits are unremarkable.

Other: Right periorbital soft tissue swelling. Mastoid air cells are
clear.
IMPRESSION: No evidence of acute intracranial injury.

## 2022-04-22 IMAGING — DX DG HIP (WITH OR WITHOUT PELVIS) 2-3V*R*
3 series · 3 of 3 positions shown · non-contrast
Comparison: Fluoroscopic images same day.

CLINICAL DATA: Status post right hip replacement.

EXAM:
DG HIP (WITH OR WITHOUT PELVIS) 2-3V RIGHT

[hip ap]
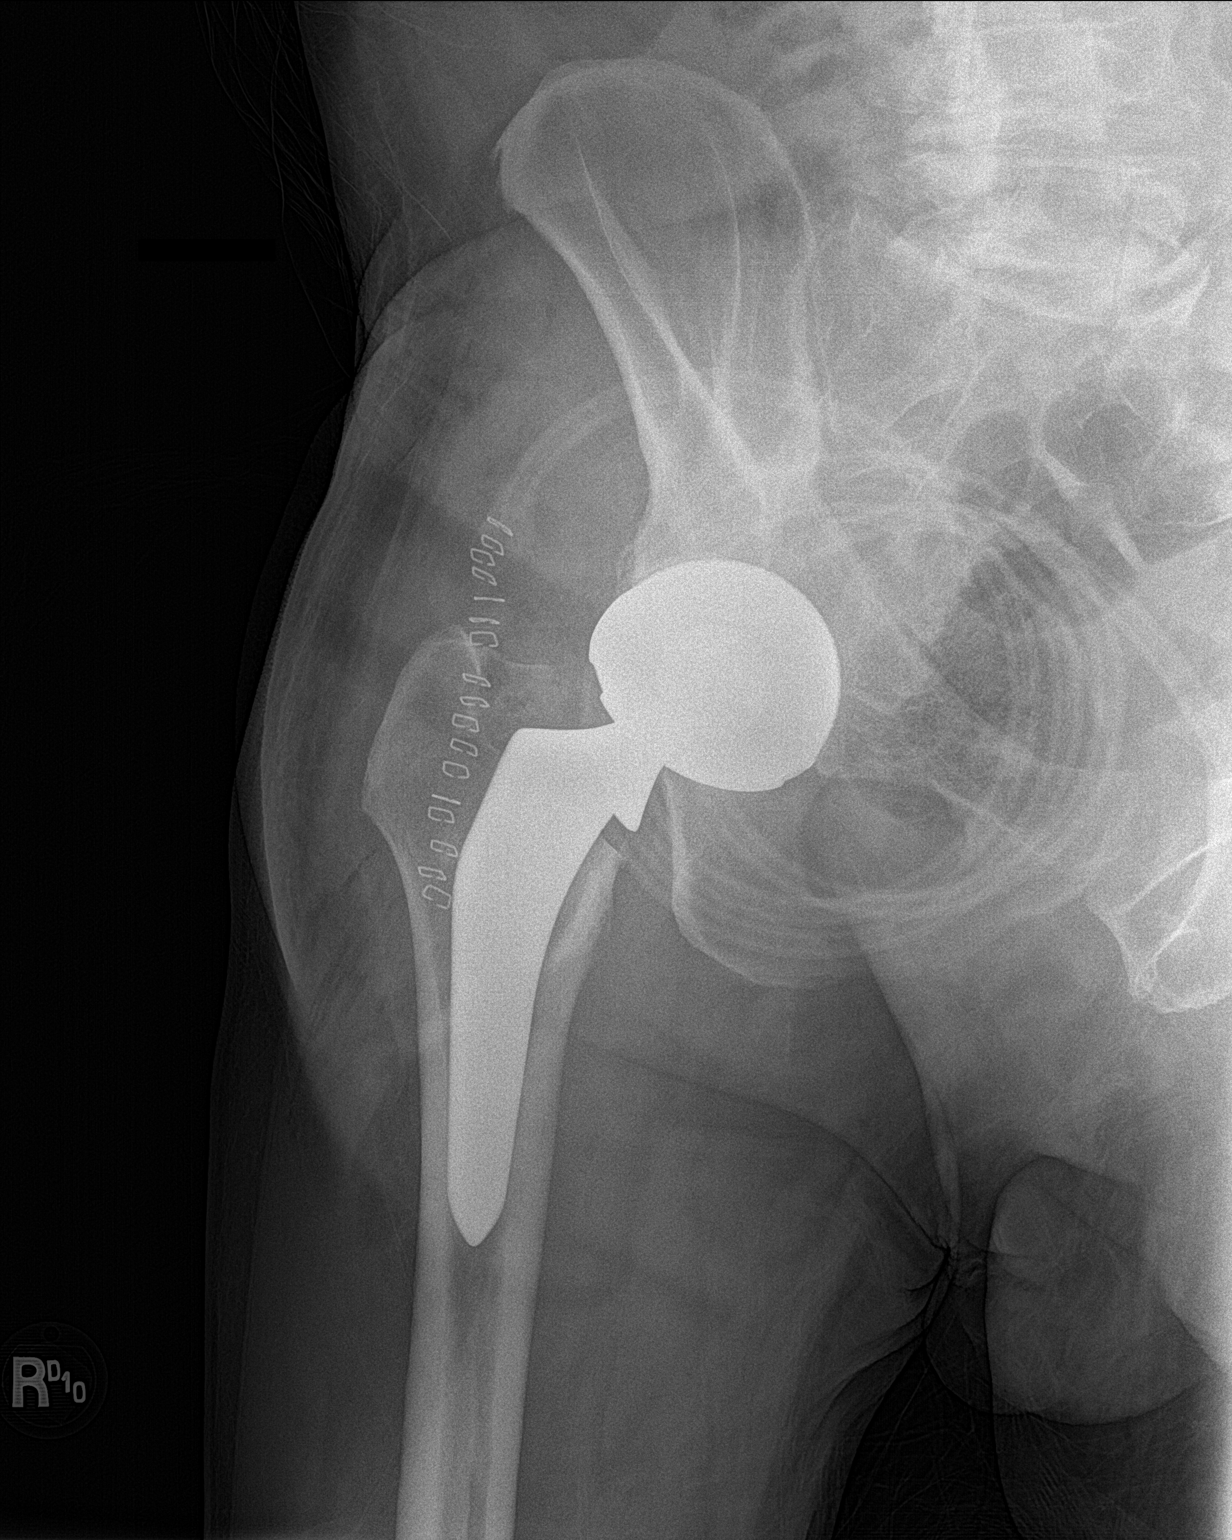

[hip lat (1 of 2)]
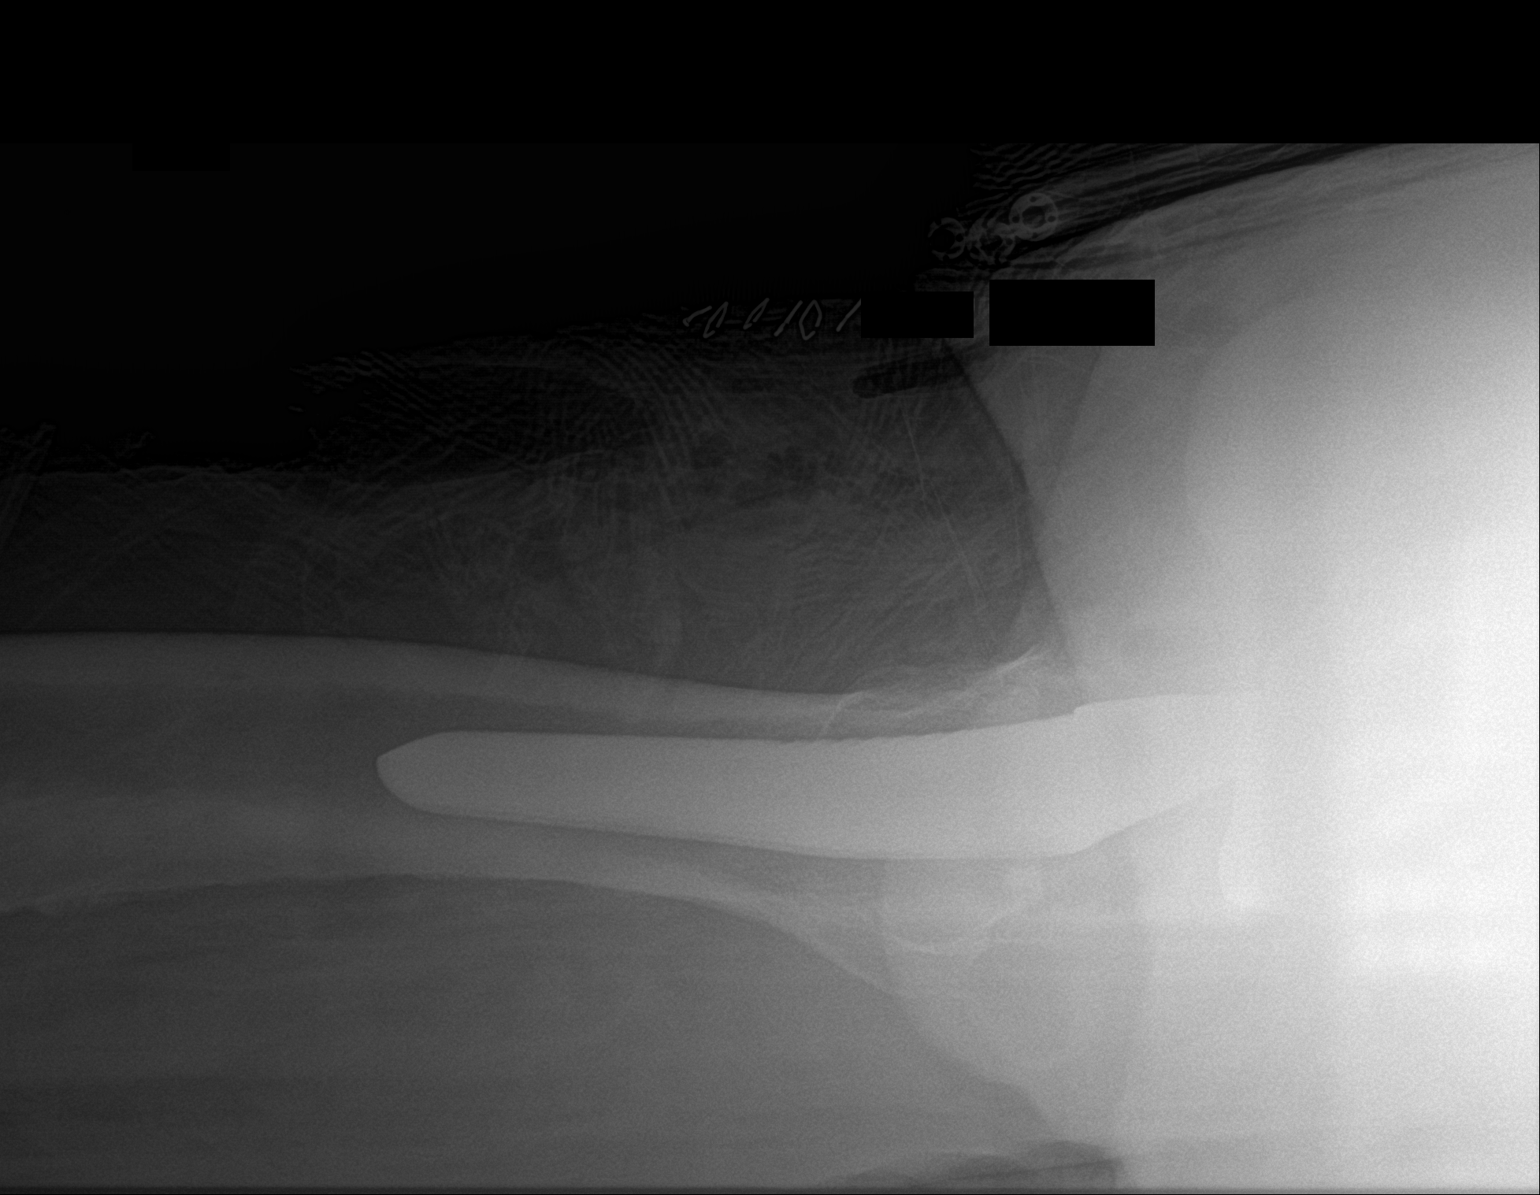

[hip lat (2 of 2)]
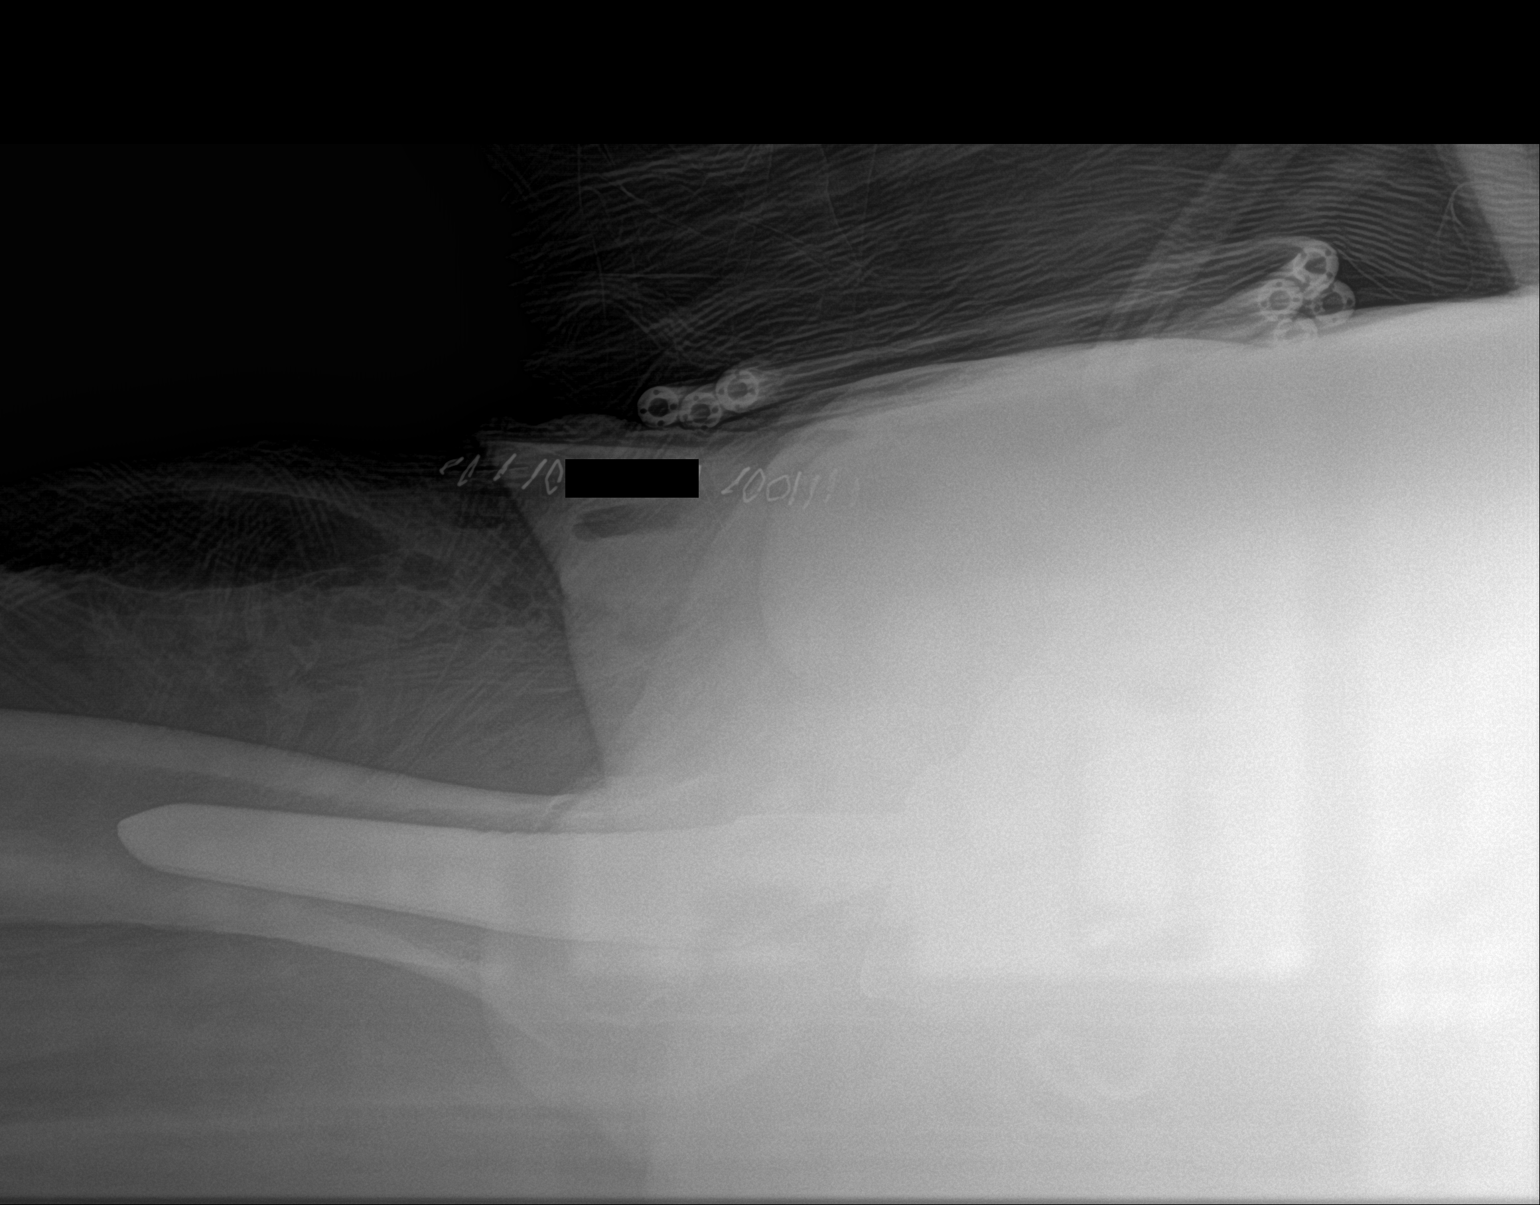

[3 of 3 positions shown; findings below may reference images not displayed]

FINDINGS: Right acetabular and femoral components are well situated. Expected
postoperative changes seen in the surrounding soft tissues.
IMPRESSION: Status post right total hip arthroplasty.

## 2022-04-22 IMAGING — RF DG HIP (WITH OR WITHOUT PELVIS) 1V*R*
1 series · 3 of 3 positions shown · non-contrast
Comparison: Pelvis radiographs dated 1 day prior

CLINICAL DATA: Right hip arthroplasty

EXAM:
DG HIP (WITH OR WITHOUT PELVIS) 1V RIGHT

[Series 1: dg x-ray · 0.20mm/px · 3 of 3 slices shown]
[im 1/3]
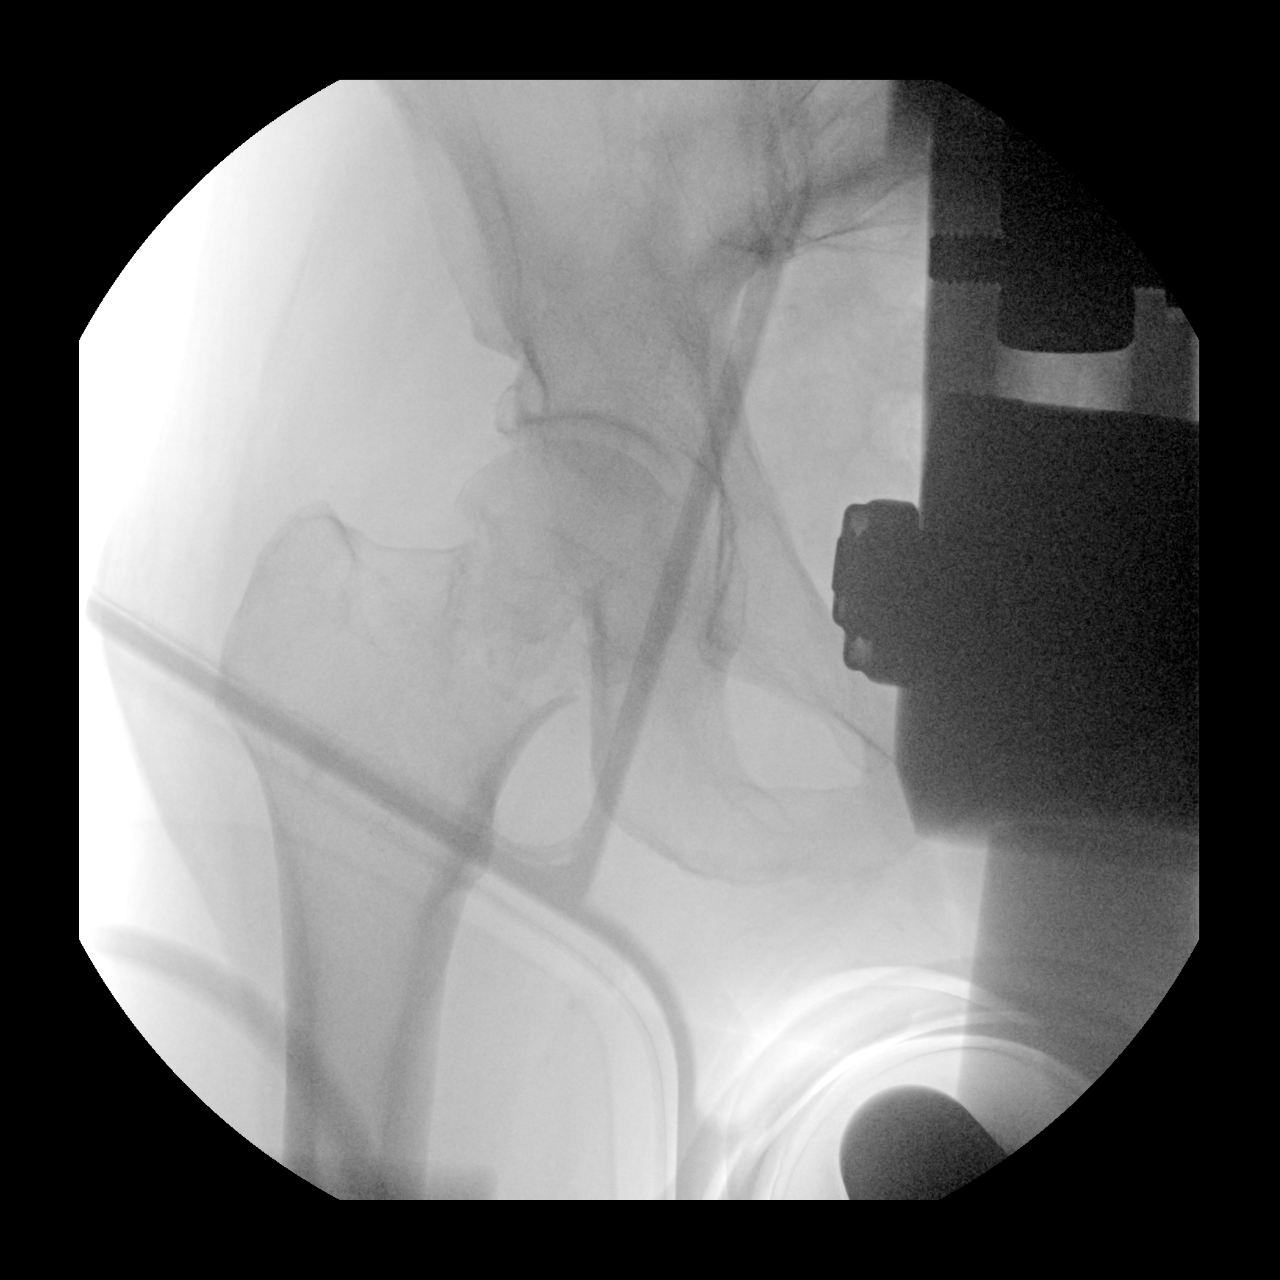
[im 2/3]
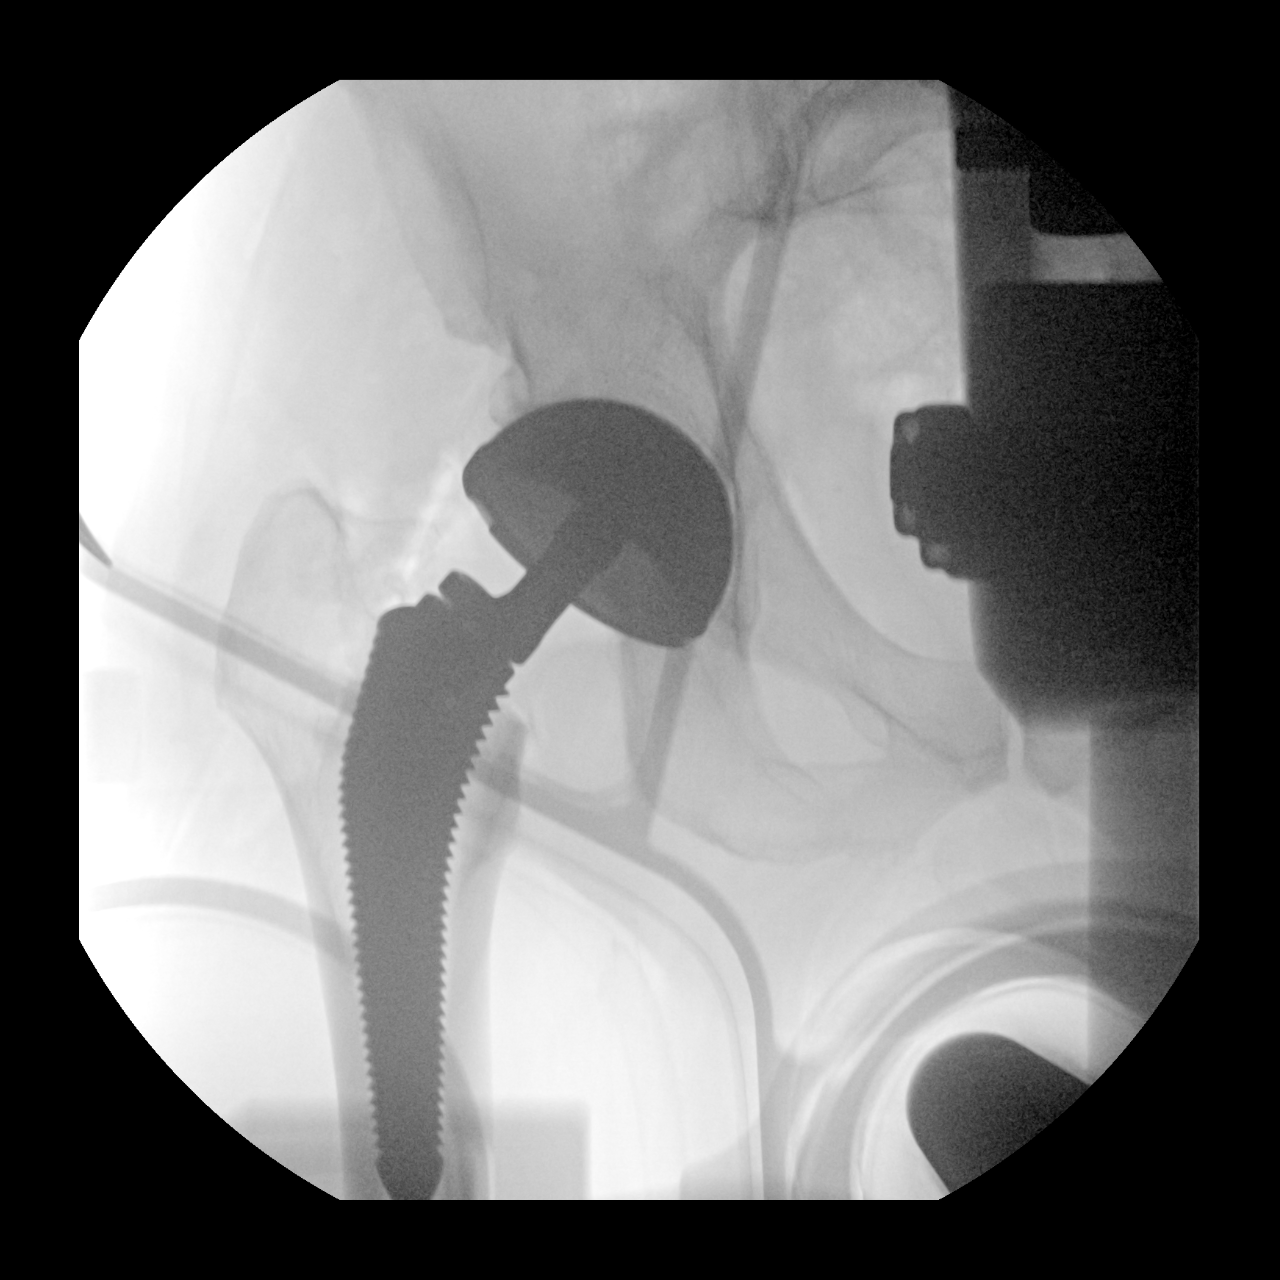
[im 3/3]
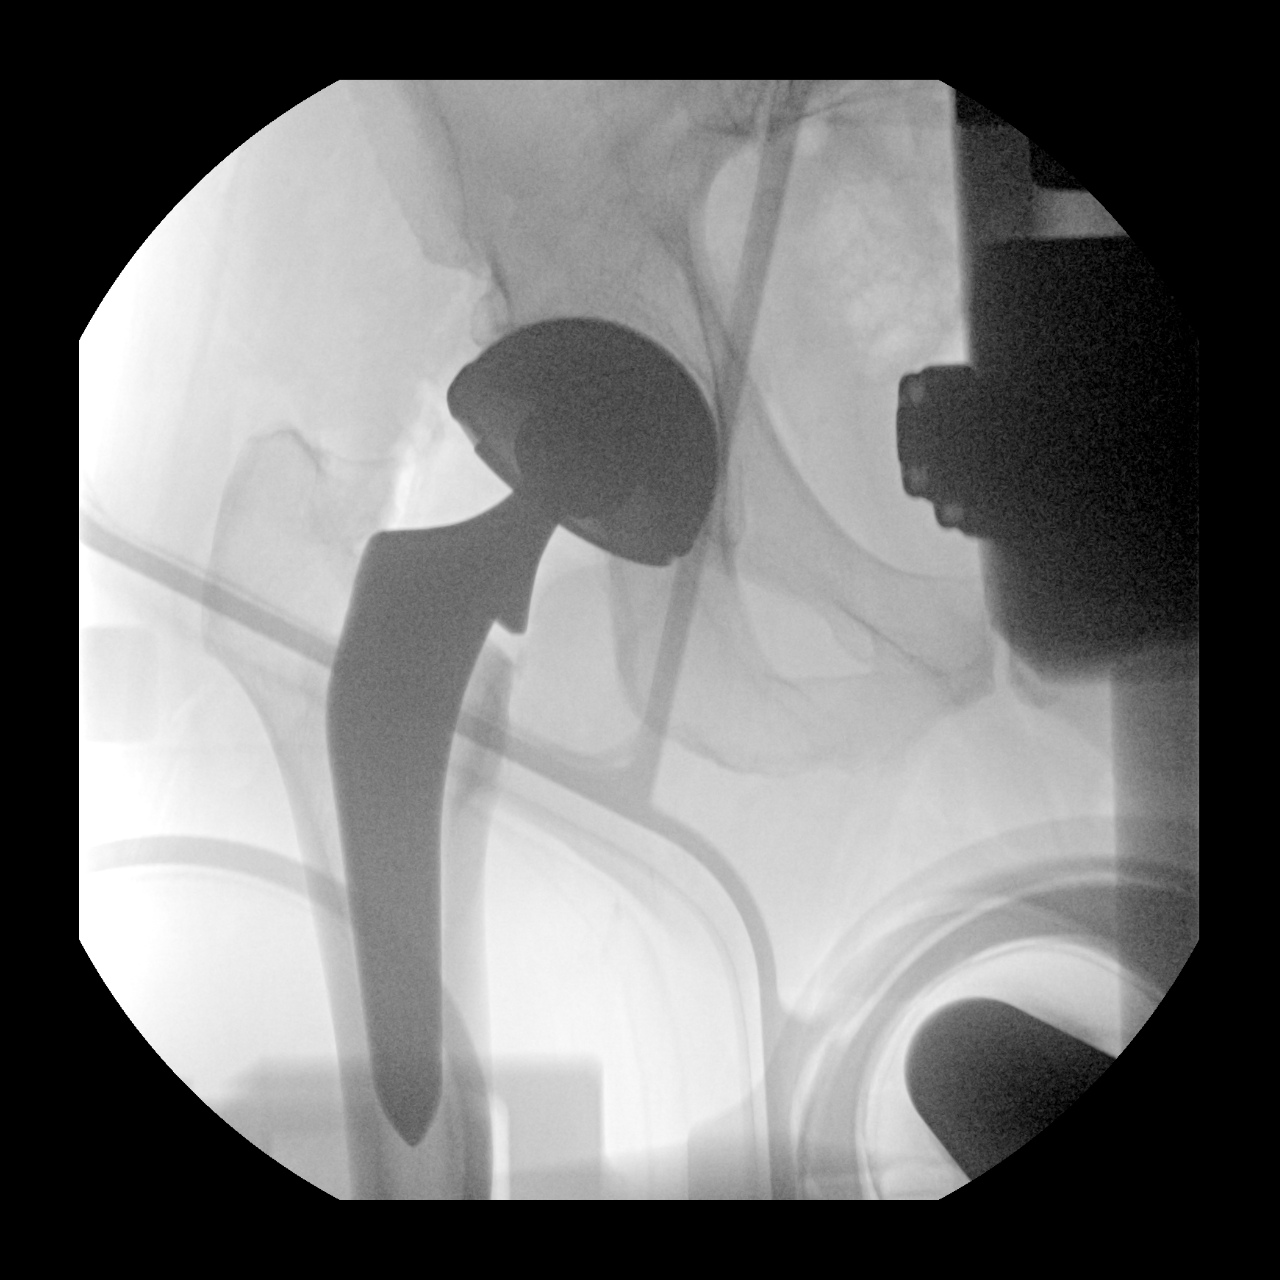

[3 of 3 positions shown; findings below may reference images not displayed]

FINDINGS: Three C-arm fluoroscopic images were obtained intraoperatively and
submitted for post operative interpretation. The initial image
demonstrates a right femoral neck fracture in improved alignment
compared to the pelvis radiographs obtained 1 day prior. Subsequent
images demonstrate postsurgical changes reflecting right hip
arthroplasty. Hardware alignment is within expected limits, without
evidence of complication. Fluoro time 14 seconds. Please see the
performing provider's procedural report for further detail.
IMPRESSION: Status post right hip arthroplasty without evidence of complication.

## 2022-05-16 ENCOUNTER — Emergency Department: Payer: Medicare Other

## 2022-05-16 ENCOUNTER — Inpatient Hospital Stay
Admission: EM | Admit: 2022-05-16 | Discharge: 2022-06-30 | DRG: 853 | Disposition: E | Payer: Medicare Other | Attending: Osteopathic Medicine | Admitting: Osteopathic Medicine

## 2022-05-16 ENCOUNTER — Other Ambulatory Visit: Payer: Self-pay

## 2022-05-16 ENCOUNTER — Other Ambulatory Visit: Payer: Medicare Other

## 2022-05-16 ENCOUNTER — Inpatient Hospital Stay: Payer: Medicare Other

## 2022-05-16 ENCOUNTER — Encounter: Payer: Self-pay | Admitting: Internal Medicine

## 2022-05-16 DIAGNOSIS — C787 Secondary malignant neoplasm of liver and intrahepatic bile duct: Secondary | ICD-10-CM | POA: Diagnosis present

## 2022-05-16 DIAGNOSIS — F419 Anxiety disorder, unspecified: Secondary | ICD-10-CM | POA: Diagnosis present

## 2022-05-16 DIAGNOSIS — F319 Bipolar disorder, unspecified: Secondary | ICD-10-CM | POA: Diagnosis present

## 2022-05-16 DIAGNOSIS — R6521 Severe sepsis with septic shock: Secondary | ICD-10-CM | POA: Diagnosis present

## 2022-05-16 DIAGNOSIS — W19XXXA Unspecified fall, initial encounter: Secondary | ICD-10-CM | POA: Diagnosis present

## 2022-05-16 DIAGNOSIS — Z66 Do not resuscitate: Secondary | ICD-10-CM | POA: Diagnosis not present

## 2022-05-16 DIAGNOSIS — Y92009 Unspecified place in unspecified non-institutional (private) residence as the place of occurrence of the external cause: Secondary | ICD-10-CM | POA: Diagnosis not present

## 2022-05-16 DIAGNOSIS — N390 Urinary tract infection, site not specified: Secondary | ICD-10-CM | POA: Diagnosis present

## 2022-05-16 DIAGNOSIS — E1169 Type 2 diabetes mellitus with other specified complication: Secondary | ICD-10-CM | POA: Diagnosis present

## 2022-05-16 DIAGNOSIS — N170 Acute kidney failure with tubular necrosis: Secondary | ICD-10-CM | POA: Diagnosis not present

## 2022-05-16 DIAGNOSIS — F418 Other specified anxiety disorders: Secondary | ICD-10-CM | POA: Diagnosis present

## 2022-05-16 DIAGNOSIS — M4626 Osteomyelitis of vertebra, lumbar region: Secondary | ICD-10-CM | POA: Diagnosis not present

## 2022-05-16 DIAGNOSIS — Z96641 Presence of right artificial hip joint: Secondary | ICD-10-CM | POA: Diagnosis present

## 2022-05-16 DIAGNOSIS — Z7960 Long term (current) use of unspecified immunomodulators and immunosuppressants: Secondary | ICD-10-CM

## 2022-05-16 DIAGNOSIS — R54 Age-related physical debility: Secondary | ICD-10-CM | POA: Diagnosis present

## 2022-05-16 DIAGNOSIS — R339 Retention of urine, unspecified: Secondary | ICD-10-CM | POA: Diagnosis not present

## 2022-05-16 DIAGNOSIS — R6510 Systemic inflammatory response syndrome (SIRS) of non-infectious origin without acute organ dysfunction: Secondary | ICD-10-CM

## 2022-05-16 DIAGNOSIS — M6282 Rhabdomyolysis: Secondary | ICD-10-CM | POA: Diagnosis present

## 2022-05-16 DIAGNOSIS — S32000A Wedge compression fracture of unspecified lumbar vertebra, initial encounter for closed fracture: Secondary | ICD-10-CM | POA: Diagnosis not present

## 2022-05-16 DIAGNOSIS — Z7984 Long term (current) use of oral hypoglycemic drugs: Secondary | ICD-10-CM

## 2022-05-16 DIAGNOSIS — E119 Type 2 diabetes mellitus without complications: Secondary | ICD-10-CM

## 2022-05-16 DIAGNOSIS — M4620 Osteomyelitis of vertebra, site unspecified: Secondary | ICD-10-CM | POA: Diagnosis not present

## 2022-05-16 DIAGNOSIS — K7200 Acute and subacute hepatic failure without coma: Secondary | ICD-10-CM | POA: Diagnosis present

## 2022-05-16 DIAGNOSIS — B9561 Methicillin susceptible Staphylococcus aureus infection as the cause of diseases classified elsewhere: Secondary | ICD-10-CM | POA: Diagnosis present

## 2022-05-16 DIAGNOSIS — Z602 Problems related to living alone: Secondary | ICD-10-CM | POA: Diagnosis present

## 2022-05-16 DIAGNOSIS — Z8546 Personal history of malignant neoplasm of prostate: Secondary | ICD-10-CM

## 2022-05-16 DIAGNOSIS — Z515 Encounter for palliative care: Secondary | ICD-10-CM | POA: Diagnosis not present

## 2022-05-16 DIAGNOSIS — A4189 Other specified sepsis: Secondary | ICD-10-CM | POA: Diagnosis not present

## 2022-05-16 DIAGNOSIS — R296 Repeated falls: Secondary | ICD-10-CM | POA: Diagnosis present

## 2022-05-16 DIAGNOSIS — N179 Acute kidney failure, unspecified: Secondary | ICD-10-CM | POA: Diagnosis not present

## 2022-05-16 DIAGNOSIS — S0012XA Contusion of left eyelid and periocular area, initial encounter: Secondary | ICD-10-CM | POA: Diagnosis present

## 2022-05-16 DIAGNOSIS — R7989 Other specified abnormal findings of blood chemistry: Secondary | ICD-10-CM | POA: Diagnosis present

## 2022-05-16 DIAGNOSIS — E43 Unspecified severe protein-calorie malnutrition: Secondary | ICD-10-CM | POA: Insufficient documentation

## 2022-05-16 DIAGNOSIS — T796XXA Traumatic ischemia of muscle, initial encounter: Secondary | ICD-10-CM | POA: Diagnosis present

## 2022-05-16 DIAGNOSIS — L89156 Pressure-induced deep tissue damage of sacral region: Secondary | ICD-10-CM | POA: Diagnosis not present

## 2022-05-16 DIAGNOSIS — E785 Hyperlipidemia, unspecified: Secondary | ICD-10-CM | POA: Diagnosis present

## 2022-05-16 DIAGNOSIS — K6812 Psoas muscle abscess: Secondary | ICD-10-CM

## 2022-05-16 DIAGNOSIS — M4646 Discitis, unspecified, lumbar region: Secondary | ICD-10-CM | POA: Diagnosis not present

## 2022-05-16 DIAGNOSIS — I471 Supraventricular tachycardia, unspecified: Secondary | ICD-10-CM | POA: Diagnosis present

## 2022-05-16 DIAGNOSIS — Z825 Family history of asthma and other chronic lower respiratory diseases: Secondary | ICD-10-CM

## 2022-05-16 DIAGNOSIS — N10 Acute tubulo-interstitial nephritis: Secondary | ICD-10-CM | POA: Diagnosis present

## 2022-05-16 DIAGNOSIS — E781 Pure hyperglyceridemia: Secondary | ICD-10-CM | POA: Diagnosis present

## 2022-05-16 DIAGNOSIS — F209 Schizophrenia, unspecified: Secondary | ICD-10-CM | POA: Diagnosis present

## 2022-05-16 DIAGNOSIS — I4891 Unspecified atrial fibrillation: Secondary | ICD-10-CM | POA: Diagnosis present

## 2022-05-16 DIAGNOSIS — R338 Other retention of urine: Secondary | ICD-10-CM | POA: Diagnosis not present

## 2022-05-16 DIAGNOSIS — Z885 Allergy status to narcotic agent status: Secondary | ICD-10-CM

## 2022-05-16 DIAGNOSIS — E782 Mixed hyperlipidemia: Secondary | ICD-10-CM | POA: Diagnosis not present

## 2022-05-16 DIAGNOSIS — I4892 Unspecified atrial flutter: Secondary | ICD-10-CM | POA: Diagnosis present

## 2022-05-16 DIAGNOSIS — Z79899 Other long term (current) drug therapy: Secondary | ICD-10-CM

## 2022-05-16 DIAGNOSIS — I10 Essential (primary) hypertension: Secondary | ICD-10-CM | POA: Diagnosis present

## 2022-05-16 DIAGNOSIS — R1084 Generalized abdominal pain: Secondary | ICD-10-CM | POA: Diagnosis not present

## 2022-05-16 DIAGNOSIS — M4856XA Collapsed vertebra, not elsewhere classified, lumbar region, initial encounter for fracture: Secondary | ICD-10-CM | POA: Diagnosis present

## 2022-05-16 DIAGNOSIS — G9341 Metabolic encephalopathy: Secondary | ICD-10-CM | POA: Insufficient documentation

## 2022-05-16 DIAGNOSIS — A419 Sepsis, unspecified organism: Secondary | ICD-10-CM | POA: Diagnosis present

## 2022-05-16 DIAGNOSIS — R319 Hematuria, unspecified: Secondary | ICD-10-CM | POA: Diagnosis present

## 2022-05-16 DIAGNOSIS — G2401 Drug induced subacute dyskinesia: Secondary | ICD-10-CM | POA: Diagnosis present

## 2022-05-16 DIAGNOSIS — R809 Proteinuria, unspecified: Secondary | ICD-10-CM | POA: Diagnosis not present

## 2022-05-16 DIAGNOSIS — Z888 Allergy status to other drugs, medicaments and biological substances status: Secondary | ICD-10-CM

## 2022-05-16 DIAGNOSIS — R109 Unspecified abdominal pain: Secondary | ICD-10-CM | POA: Diagnosis present

## 2022-05-16 DIAGNOSIS — C259 Malignant neoplasm of pancreas, unspecified: Secondary | ICD-10-CM | POA: Diagnosis not present

## 2022-05-16 DIAGNOSIS — G061 Intraspinal abscess and granuloma: Secondary | ICD-10-CM

## 2022-05-16 DIAGNOSIS — Z8249 Family history of ischemic heart disease and other diseases of the circulatory system: Secondary | ICD-10-CM

## 2022-05-16 DIAGNOSIS — N401 Enlarged prostate with lower urinary tract symptoms: Secondary | ICD-10-CM | POA: Diagnosis present

## 2022-05-16 DIAGNOSIS — Z6823 Body mass index (BMI) 23.0-23.9, adult: Secondary | ICD-10-CM

## 2022-05-16 DIAGNOSIS — S0001XA Abrasion of scalp, initial encounter: Secondary | ICD-10-CM | POA: Diagnosis present

## 2022-05-16 DIAGNOSIS — I484 Atypical atrial flutter: Secondary | ICD-10-CM | POA: Diagnosis not present

## 2022-05-16 LAB — URINALYSIS, COMPLETE (UACMP) WITH MICROSCOPIC
Bilirubin Urine: NEGATIVE
Glucose, UA: 150 mg/dL — AB
Ketones, ur: 20 mg/dL — AB
Leukocytes,Ua: NEGATIVE
Nitrite: POSITIVE — AB
Protein, ur: 100 mg/dL — AB
Specific Gravity, Urine: 1.014 (ref 1.005–1.030)
Squamous Epithelial / HPF: NONE SEEN (ref 0–5)
pH: 6 (ref 5.0–8.0)

## 2022-05-16 LAB — CK: Total CK: 22395 U/L — ABNORMAL HIGH (ref 49–397)

## 2022-05-16 LAB — GLUCOSE, CAPILLARY
Glucose-Capillary: 182 mg/dL — ABNORMAL HIGH (ref 70–99)
Glucose-Capillary: 161 mg/dL — ABNORMAL HIGH (ref 70–99)
Glucose-Capillary: 162 mg/dL — ABNORMAL HIGH (ref 70–99)

## 2022-05-16 LAB — COMPREHENSIVE METABOLIC PANEL
ALT: 69 U/L — ABNORMAL HIGH (ref 0–44)
AST: 389 U/L — ABNORMAL HIGH (ref 15–41)
Albumin: 3.8 g/dL (ref 3.5–5.0)
Alkaline Phosphatase: 104 U/L (ref 38–126)
Anion gap: 14 (ref 5–15)
BUN: 17 mg/dL (ref 8–23)
CO2: 21 mmol/L — ABNORMAL LOW (ref 22–32)
Calcium: 9.1 mg/dL (ref 8.9–10.3)
Chloride: 105 mmol/L (ref 98–111)
Creatinine, Ser: 1.19 mg/dL (ref 0.61–1.24)
GFR, Estimated: >60 mL/min (ref >60)
Glucose, Bld: 204 mg/dL — ABNORMAL HIGH (ref 70–99)
Potassium: 3.8 mmol/L (ref 3.5–5.1)
Sodium: 140 mmol/L (ref 135–145)
Total Bilirubin: 1.2 mg/dL (ref 0.3–1.2)
Total Protein: 6.8 g/dL (ref 6.5–8.1)

## 2022-05-16 LAB — CBC WITH DIFFERENTIAL/PLATELET
Abs Immature Granulocytes: 0.1 10*3/uL — ABNORMAL HIGH (ref 0.00–0.07)
Basophils Absolute: 0.1 10*3/uL (ref 0.0–0.1)
Basophils Relative: 1 %
Eosinophils Absolute: 0.1 10*3/uL (ref 0.0–0.5)
Eosinophils Relative: 1 %
HCT: 44.1 % (ref 39.0–52.0)
Hemoglobin: 14.8 g/dL (ref 13.0–17.0)
Immature Granulocytes: 1 %
Lymphocytes Relative: 2 %
Lymphs Abs: 0.3 10*3/uL — ABNORMAL LOW (ref 0.7–4.0)
MCH: 28.1 pg (ref 26.0–34.0)
MCHC: 33.6 g/dL (ref 30.0–36.0)
MCV: 83.7 fL (ref 80.0–100.0)
Monocytes Absolute: 0.8 10*3/uL (ref 0.1–1.0)
Monocytes Relative: 5 %
Neutro Abs: 15.3 10*3/uL — ABNORMAL HIGH (ref 1.7–7.7)
Neutrophils Relative %: 90 %
Platelets: 155 10*3/uL (ref 150–400)
RBC: 5.27 MIL/uL (ref 4.22–5.81)
RDW: 12.7 % (ref 11.5–15.5)
WBC: 16.8 10*3/uL — ABNORMAL HIGH (ref 4.0–10.5)
nRBC: 0 % (ref 0.0–0.2)

## 2022-05-16 LAB — LACTIC ACID, PLASMA
Lactic Acid, Venous: 4.3 mmol/L (ref 0.5–1.9)
Lactic Acid, Venous: 2.8 mmol/L (ref 0.5–1.9)
Lactic Acid, Venous: 3.1 mmol/L (ref 0.5–1.9)
Lactic Acid, Venous: 2.7 mmol/L (ref 0.5–1.9)
Lactic Acid, Venous: 3.2 mmol/L (ref 0.5–1.9)

## 2022-05-16 LAB — PROTIME-INR
INR: 1.3 — ABNORMAL HIGH (ref 0.8–1.2)
Prothrombin Time: 16.4 seconds — ABNORMAL HIGH (ref 11.4–15.2)

## 2022-05-16 LAB — PROCALCITONIN: Procalcitonin: 23.99 ng/mL

## 2022-05-16 LAB — APTT: aPTT: 34 seconds (ref 24–36)

## 2022-05-16 LAB — LIPASE, BLOOD: Lipase: 22 U/L (ref 11–51)

## 2022-05-16 MED ORDER — HYDRALAZINE HCL 20 MG/ML IJ SOLN: 5.0000 mg | INTRAMUSCULAR | Status: DC | PRN

## 2022-05-16 MED ORDER — OXYCODONE-ACETAMINOPHEN 5-325 MG PO TABS: 1.0000 | ORAL_TABLET | Freq: Four times a day (QID) | ORAL | Status: DC | PRN

## 2022-05-16 MED ORDER — SODIUM CHLORIDE 0.9 % IV SOLN
1.0000 g | INTRAVENOUS | Status: DC
  Administered 2022-05-16 – 2022-05-17 (×2): 1 g via INTRAVENOUS
  Filled 2022-05-16: qty 10
  Filled 2022-05-16: qty 1
  Filled 2022-05-16: qty 10

## 2022-05-16 MED ORDER — ONDANSETRON HCL 4 MG/2ML IJ SOLN: 4.0000 mg | Freq: Three times a day (TID) | INTRAMUSCULAR | Status: DC | PRN

## 2022-05-16 MED ORDER — MORPHINE SULFATE (PF) 2 MG/ML IV SOLN
1.0000 mg | INTRAVENOUS | Status: DC | PRN
  Administered 2022-05-16 – 2022-05-26 (×3): 1 mg via INTRAVENOUS
  Filled 2022-05-16 (×3): qty 1

## 2022-05-16 MED ORDER — OMEGA-3-ACID ETHYL ESTERS 1 G PO CAPS
1.0000 g | ORAL_CAPSULE | Freq: Every day | ORAL | Status: DC
  Administered 2022-05-16 – 2022-05-29 (×12): 1 g via ORAL
  Filled 2022-05-16 (×13): qty 1

## 2022-05-16 MED ORDER — INSULIN ASPART 100 UNIT/ML IJ SOLN
0.0000 [IU] | Freq: Three times a day (TID) | INTRAMUSCULAR | Status: DC
  Administered 2022-05-16: 2 [IU] via SUBCUTANEOUS
  Administered 2022-05-17: 1 [IU] via SUBCUTANEOUS
  Administered 2022-05-17 (×2): 2 [IU] via SUBCUTANEOUS
  Administered 2022-05-18 (×3): 1 [IU] via SUBCUTANEOUS
  Administered 2022-05-19: 3 [IU] via SUBCUTANEOUS
  Administered 2022-05-19 – 2022-05-21 (×8): 2 [IU] via SUBCUTANEOUS
  Administered 2022-05-22: 3 [IU] via SUBCUTANEOUS
  Administered 2022-05-22: 2 [IU] via SUBCUTANEOUS
  Administered 2022-05-22 – 2022-05-23 (×2): 3 [IU] via SUBCUTANEOUS
  Administered 2022-05-23 – 2022-05-25 (×5): 2 [IU] via SUBCUTANEOUS
  Administered 2022-05-25: 3 [IU] via SUBCUTANEOUS
  Administered 2022-05-25 – 2022-05-29 (×11): 2 [IU] via SUBCUTANEOUS
  Administered 2022-05-29: 1 [IU] via SUBCUTANEOUS
  Filled 2022-05-16 (×32): qty 1

## 2022-05-16 MED ORDER — IBUPROFEN 400 MG PO TABS
200.0000 mg | ORAL_TABLET | Freq: Four times a day (QID) | ORAL | Status: DC | PRN
  Administered 2022-05-17 – 2022-05-18 (×2): 200 mg via ORAL
  Filled 2022-05-16 (×2): qty 1

## 2022-05-16 MED ORDER — SUVOREXANT 20 MG PO TABS: 20.0000 mg | ORAL_TABLET | Freq: Every day | ORAL | Status: DC

## 2022-05-16 MED ORDER — METOPROLOL SUCCINATE ER 50 MG PO TB24
50.0000 mg | ORAL_TABLET | Freq: Every day | ORAL | Status: DC
  Administered 2022-05-16 – 2022-05-19 (×3): 50 mg via ORAL
  Filled 2022-05-16 (×3): qty 1

## 2022-05-16 MED ORDER — RISPERIDONE 3 MG PO TABS
3.0000 mg | ORAL_TABLET | Freq: Every day | ORAL | Status: DC
  Administered 2022-05-16 – 2022-05-28 (×13): 3 mg via ORAL
  Filled 2022-05-16 (×13): qty 1

## 2022-05-16 MED ORDER — SODIUM CHLORIDE 0.9 % IV SOLN
2.0000 g | Freq: Once | INTRAVENOUS | Status: AC
  Administered 2022-05-16: 2 g via INTRAVENOUS
  Filled 2022-05-16: qty 12.5

## 2022-05-16 MED ORDER — SODIUM CHLORIDE 0.9 % IV BOLUS (SEPSIS)
1500.0000 mL | Freq: Once | INTRAVENOUS | Status: AC
  Administered 2022-05-16: 1500 mL via INTRAVENOUS

## 2022-05-16 MED ORDER — FLUVOXAMINE MALEATE 50 MG PO TABS
100.0000 mg | ORAL_TABLET | Freq: Two times a day (BID) | ORAL | Status: DC
  Administered 2022-05-16 – 2022-05-29 (×26): 100 mg via ORAL
  Filled 2022-05-16 (×26): qty 2

## 2022-05-16 MED ORDER — LORAZEPAM 1 MG PO TABS
1.0000 mg | ORAL_TABLET | Freq: Every day | ORAL | Status: DC | PRN
  Administered 2022-05-29 – 2022-05-30 (×2): 1 mg via ORAL
  Filled 2022-05-16 (×2): qty 1

## 2022-05-16 MED ORDER — FLUOXETINE HCL 20 MG PO CAPS
40.0000 mg | ORAL_CAPSULE | Freq: Every day | ORAL | Status: DC
  Administered 2022-05-16 – 2022-05-31 (×16): 40 mg via ORAL
  Filled 2022-05-16 (×17): qty 2

## 2022-05-16 MED ORDER — METOPROLOL TARTRATE 5 MG/5ML IV SOLN
5.0000 mg | INTRAVENOUS | Status: DC | PRN
  Administered 2022-05-16 – 2022-05-28 (×15): 5 mg via INTRAVENOUS
  Filled 2022-05-16 (×15): qty 5

## 2022-05-16 MED ORDER — IOHEXOL 300 MG/ML  SOLN
100.0000 mL | Freq: Once | INTRAMUSCULAR | Status: AC | PRN
  Administered 2022-05-16: 100 mL via INTRAVENOUS

## 2022-05-16 MED ORDER — ENOXAPARIN SODIUM 40 MG/0.4ML IJ SOSY
40.0000 mg | PREFILLED_SYRINGE | INTRAMUSCULAR | Status: DC
  Administered 2022-05-16 – 2022-05-20 (×5): 40 mg via SUBCUTANEOUS
  Filled 2022-05-16 (×5): qty 0.4

## 2022-05-16 MED ORDER — LAMOTRIGINE 100 MG PO TABS
200.0000 mg | ORAL_TABLET | Freq: Two times a day (BID) | ORAL | Status: DC
  Administered 2022-05-16 – 2022-05-31 (×30): 200 mg via ORAL
  Filled 2022-05-16 (×30): qty 2

## 2022-05-16 MED ORDER — SODIUM CHLORIDE 0.9 % IV BOLUS
1000.0000 mL | Freq: Once | INTRAVENOUS | Status: AC
  Administered 2022-05-16: 1000 mL via INTRAVENOUS

## 2022-05-16 MED ORDER — OXYCODONE HCL 5 MG PO TABS
5.0000 mg | ORAL_TABLET | Freq: Four times a day (QID) | ORAL | Status: DC | PRN
  Administered 2022-05-16 – 2022-05-30 (×11): 5 mg via ORAL
  Filled 2022-05-16 (×12): qty 1

## 2022-05-16 MED ORDER — SODIUM CHLORIDE 0.9 % IV SOLN: INTRAVENOUS | Status: DC

## 2022-05-16 MED ORDER — INSULIN ASPART 100 UNIT/ML IJ SOLN
0.0000 [IU] | Freq: Every day | INTRAMUSCULAR | Status: DC
  Administered 2022-05-21 – 2022-05-27 (×2): 2 [IU] via SUBCUTANEOUS
  Filled 2022-05-16 (×2): qty 1

## 2022-05-16 NOTE — Progress Notes (Signed)
Elink following code sepsis °

## 2022-05-16 NOTE — H&P (Addendum)
History and Physical    Dre Gamino DJT:[id number] DOB: 07/03/1950 DOA: 05/26/2022  Referring MD/NP/PA:   PCP: Romualdo Bolk, FNP   Patient coming from:  The patient is coming from home.  At baseline, pt is independent for most of ADL.        Chief Complaint: weakness, fall, and abdominal pain  HPI: Pedro Mccoy is a 71 y.o. male with medical history significant of metastasized pancreatic cancer on chemotherapy (last dose was on Thursday), hypertension, hyperlipidemia, diabetes mellitus, depression with anxiety, schizophrenia, tardive dyskinesia, who presents with weakness, fall and abdominal pain.  Patient states that he has generalized weakness recently. He fell in the early morning twice.  He has pain in left hip, left shoulder, and lower back.  He has bruise around his left eye. No LOC.  Patient does not have chest pain, cough, shortness breath.  He has nausea, no vomiting or diarrhea.  He has suprapubic abdominal pain which is constant, mild, aching, nonradiating.  No fever or chills.  Denies symptoms of UTI.  Patient has shaking in both hands during interview which he  attributes to tardive dyskinesia.  CT-abd/pelvis on 04/14/22: --Multiple new hypoenhancing liver masses, suspicious for hepatic metastases.  --Primary pancreatic mass has slightly increased in size and there is new peripancreatic inflammation. This could reflect superimposed pancreatitis.   Data reviewed independently and ED Course: pt was found to have WBC 16.8, lactic acid 4.3, 2.8, INR 1.3, PTT 34, CK22 395, positive urinalysis (hazy appearance, negative leukocyte, positive nitrite, many bacteria, WBC 11-20), GFR> 60, abnormal liver function (ALP 104, AST 389, ALT 69, total bilirubin 1.2), temperature normal, blood pressure 128/77, heart rate 103, RR 24, oxygen saturation 96% on room air.  Chest x-ray negative.  CT of head and CT of C-spine is negative for acute injury.  X-ray of left shoulder and left hip/pelvis is  negative for acute injury.  Pending x-ray of lumbar spine.  Patient is admitted to telemetry bed as inpatient.  CT abdomen/pelvis today 1. Substantial enlargement of the hypoenhancing infiltrative pancreatic mass, currently 6.6 by 4.2 cm, previously about 2.7 by 2.0 cm on 05/20/2021. This mass abuts portions of the splenic artery, the junction of the SMV and portal vein, and nearly occludes the splenic vein. 2. 4 new hypodense lesions in the right hepatic lobe are probably metastatic lesions. 3. Acute or early subacute fracture of the L3 vertebral body with involvement of the superior endplate and extension to the middle column, with nearly 50% loss of vertebral body height and some mild anterior fragmentation of the vertebral rim. 1-2 mm posterior bony retropulsion. Paraspinal edema noted. 4. Subtle parenchymal hypoenhancement posteriorly in the right kidney upper pole, correlate with urine analysis in assessing for possible pyelonephritis. 5. Distended urinary bladder. 6. Prostatomegaly. 7. Somewhat prominent piriformis and obturator internus musculature in the pelvis, left greater than right, cannot exclude left sciatic notch impingement due to the left piriformis hypertrophy. 8. Multilevel lumbar spondylosis and degenerative disc disease leading to multilevel impingement. 9. Small umbilical hernia containing adipose tissue. 10. Aortic atherosclerosis.   Aortic Atherosclerosis (ICD10-I70.0).   EKG: I have personally reviewed.  Sinus rhythm, QTc 461, LAD, low voltage, poor R wave progression.   Review of Systems:   General: no fevers, chills, no body weight gain, has poor appetite, has fatigue HEENT: no blurry vision, hearing changes or sore throat Respiratory: no dyspnea, coughing, wheezing CV: no chest pain, no palpitations GI: has nausea,  abdominal pain, no diarrhea, constipation, vomiting,  GU: no dysuria, burning on urination, increased urinary frequency, hematuria   Ext: no leg edema Neuro: no unilateral weakness, numbness, or tingling, no vision change or hearing loss. Has fall. Skin: no rash, no skin tear. Has bruise around left eye MSK: No muscle spasm, no deformity, no limitation of range of movement in spin Heme: No easy bruising.  Travel history: No recent long distant travel.   Allergy:  Allergies  Allergen Reactions   Ace Inhibitors Hives and Rash   Hydrochlorothiazide Hives   Pioglitazone Nausea Only   Codeine     Has tolerated morphine in the past   Sitagliptin Nausea And Vomiting   Lithium Diarrhea    Past Medical History:  Diagnosis Date   Hypertension    Hypertriglyceridemia .    Past Surgical History:  Procedure Laterality Date   NO PAST SURGERIES     TOTAL HIP ARTHROPLASTY Right 05/21/2021   Procedure: ANTERIOR HIP TOTAL ARTHROPLASTY;  Surgeon: Hessie Knows, MD;  Location: [facility] ORS;  Service: Orthopedics;  Laterality: Right;    Social History:  reports that he has never smoked. He has never used smokeless tobacco. He reports that he does not drink alcohol and does not use drugs.  Family History:  Family History  Problem Relation Age of Onset   COPD Mother    Heart attack Father      Prior to Admission medications   Medication Sig Start Date End Date Taking? Authorizing Provider  apixaban (ELIQUIS) 2.5 MG TABS tablet Take 1 tablet (2.5 mg total) by mouth 2 (two) times daily for 13 days. 05/23/21 06/05/21  Lavina Hamman, MD  baclofen (LIORESAL) 10 MG tablet Take 1 tablet (10 mg total) by mouth 2 (two) times daily. 04/09/21   Lattie Corns, PA-C  benztropine (COGENTIN) 0.5 MG tablet Take 0.5 mg by mouth 2 (two) times a day. 10/20/18   [provider]  docusate sodium (COLACE) 100 MG capsule Take 1 capsule (100 mg total) by mouth 2 (two) times daily. 05/23/21   Lavina Hamman, MD  Eszopiclone 3 MG TABS Take 3 mg by mouth at bedtime. 10/20/18   [provider]  FLUoxetine (PROZAC) 20 MG  capsule Take 20 mg by mouth daily. 08/17/18   [provider]  fluvoxaMINE (LUVOX) 100 MG tablet Take 100 mg by mouth 2 (two) times a day. 10/20/18   [provider]  haloperidol (HALDOL) 5 MG tablet Take 2.5 mg by mouth daily. 10/20/18   [provider]  lamoTRIgine (LAMICTAL) 200 MG tablet Take 200 mg by mouth 2 (two) times a day. 10/20/18   [provider]  LORazepam (ATIVAN) 1 MG tablet Take 1-1.5 mg by mouth daily as needed for anxiety. 10/20/18   [provider]  metFORMIN (GLUCOPHAGE) 1000 MG tablet Take 1,000 mg by mouth 2 (two) times a day.    [provider]  metoCLOPramide (REGLAN) 10 MG tablet Take 1 tablet (10 mg total) by mouth 3 (three) times daily before meals. 04/09/21   Lattie Corns, PA-C  metoprolol succinate (TOPROL-XL) 50 MG 24 hr tablet Take 50 mg by mouth daily. Take with or immediately following a meal.    [provider]  omega-3 acid ethyl esters (LOVAZA) 1 g capsule Take 1 g by mouth daily.    [provider]  oxyCODONE-acetaminophen (PERCOCET/ROXICET) 5-325 MG tablet Take 1 tablet by mouth every 6 (six) hours as needed for moderate pain. 05/22/21   Duanne Guess, PA-C  risperiDONE (RISPERDAL) 1 MG tablet Take 1 mg by mouth 2 (two) times a day. 10/20/18   [provider]  rosuvastatin (CRESTOR) 20 MG tablet Take 20 mg by mouth daily.    [provider]    Physical Exam: Vitals:   05/23/2022 1045 05/07/2022 1130 05/14/2022 1305 05/04/2022 1340  BP: (!) 156/85 128/77 (!) 157/93 (!) 146/90  Pulse: (!) 103 (!) 103 (!) 106 (!) 110  Resp: 20 18 (!) 24 20  Temp: 98.5 F (36.9 C)  99.5 F (37.5 C) 99.5 F (37.5 C)  TempSrc: Oral  Oral Oral  SpO2: 96% 98% 96% 97%  Weight:      Height:       General: Not in acute distress HEENT:       Eyes: PERRL, EOMI, no scleral icterus.       ENT: No discharge from the ears and nose, no pharynx injection, no tonsillar enlargement.        Neck: No  JVD, no bruit, no mass felt. Heme: No neck lymph node enlargement. Cardiac: S1/S2, RRR, No murmurs, No gallops or rubs. Respiratory: No rales, wheezing, rhonchi or rubs. GI: Soft, nondistended, has tenderness in lower abdomen, no rebound pain, no organomegaly, BS present. GU: No hematuria Ext: No pitting leg edema bilaterally. 1+DP/PT pulse bilaterally. Musculoskeletal: No joint deformities, No joint redness or warmth, no limitation of ROM in spin. Skin: No rashes. Has bruise around left eye Neuro: Alert, oriented X3, cranial nerves II-XII grossly intact, moves all extremities normally.  Psych: Patient is not psychotic, no suicidal or hemocidal ideation.  Labs on Admission: I have personally reviewed following labs and imaging studies  CBC: Recent Labs  Lab 05/27/2022 1111  WBC 16.8*  NEUTROABS 15.3*  HGB 14.8  HCT 44.1  MCV 83.7  PLT 347   Basic Metabolic Panel: Recent Labs  Lab 05/15/2022 1111  NA 140  K 3.8  CL 105  CO2 21*  GLUCOSE 204*  BUN 17  CREATININE 1.19  CALCIUM 9.1   GFR: Estimated Creatinine Clearance: 64.3 mL/min (by C-G formula based on SCr of 1.19 mg/dL). Liver Function Tests: Recent Labs  Lab 05/25/2022 1111  AST 389*  ALT 69*  ALKPHOS 104  BILITOT 1.2  PROT 6.8  ALBUMIN 3.8   Recent Labs  Lab 05/11/2022 1111  LIPASE 22   No results for input(s): "AMMONIA" in the last 168 hours. Coagulation Profile: Recent Labs  Lab 05/20/2022 1111  INR 1.3*   Cardiac Enzymes: Recent Labs  Lab 04/30/2022 1111  CKTOTAL 22,395*   BNP (last 3 results) No results for input(s): "PROBNP" in the last 8760 hours. HbA1C: No results for input(s): "HGBA1C" in the last 72 hours. CBG: Recent Labs  Lab 05/27/2022 1404 05/01/2022 1652  GLUCAP 182* 161*   Lipid Profile: No results for input(s): "CHOL", "HDL", "LDLCALC", "TRIG", "CHOLHDL", "LDLDIRECT" in the last 72 hours. Thyroid Function Tests: No results for input(s): "TSH", "T4TOTAL", "FREET4", "T3FREE",  "THYROIDAB" in the last 72 hours. Anemia Panel: No results for input(s): "VITAMINB12", "FOLATE", "FERRITIN", "TIBC", "IRON", "RETICCTPCT" in the last 72 hours. Urine analysis:    Component Value Date/Time   COLORURINE YELLOW (A) 05/15/2022 1257   APPEARANCEUR HAZY (A) 05/29/2022 1257   LABSPEC 1.014 05/21/2022 1257   PHURINE 6.0 05/24/2022 1257   GLUCOSEU 150 (A) 05/19/2022 1257   HGBUR LARGE (A) 05/15/2022 1257   BILIRUBINUR NEGATIVE 05/01/2022 1257   KETONESUR 20 (A) 05/03/2022 1257   PROTEINUR 100 (A) 05/19/2022 1257  NITRITE POSITIVE (A) 04/30/2022 1257   LEUKOCYTESUR NEGATIVE 05/27/2022 1257   Sepsis Labs: '@LABRCNTIP'$ (procalcitonin:4,lacticidven:4) )No results found for this or any previous visit (from the past 240 hour(s)).   Radiological Exams on Admission: DG Lumbar Spine 2-3 Views  Result Date: 05/24/2022 CLINICAL DATA:  Fall EXAM: LUMBAR SPINE - 2-3 VIEW COMPARISON:  CT 05/23/2022 FINDINGS: There are 5 non-rib-bearing lumbar vertebrae. There is a chronic compression fracture of L1 and and acute-subacute compression fracture of L3. There is multilevel degenerative disc disease, worst at L4-L5 and L5-S1 with moderate and severe disc height loss respectively. There is moderate lower lumbar predominant facet arthropathy. Partially visualized right hip arthroplasty. IMPRESSION: Acute-subacute compression fracture of L3 with up to 50% height loss. Chronic compression deformity of L1 without evidence of progressive height loss. Multilevel degenerative disc disease, moderate at L4-L5 and severe at L5-S1. Moderate lower lumbar predominant facet arthropathy. Electronically Signed   By: Maurine Simmering M.D.   On: 05/08/2022 17:09   CT ABDOMEN PELVIS W CONTRAST  Result Date: 05/03/2022 CLINICAL DATA:  Pancreatic cancer. Blunt abdominal trauma. Patient found down on floor incontinent to urine. Recent diagnosis of sepsis. * Tracking Code: BO * EXAM: CT ABDOMEN AND PELVIS WITH CONTRAST  TECHNIQUE: Multidetector CT imaging of the abdomen and pelvis was performed using the standard protocol following bolus administration of intravenous contrast. RADIATION DOSE REDUCTION: This exam was performed according to the departmental dose-optimization program which includes automated exposure control, adjustment of the mA and/or kV according to patient size and/or use of iterative reconstruction technique. CONTRAST:  17m OMNIPAQUE IOHEXOL 300 MG/ML  SOLN COMPARISON:  Radiographs from 05/27/2022 and CT scan 05/20/2021 FINDINGS: Lower chest: Mild dependent atelectasis or scarring. Hepatobiliary: 4 new hypodense lesions in the right hepatic lobe are identified, probably metastatic lesions. The largest measures 1.9 by 1.6 cm in segment 7 of the liver (image 14, series 2). Gallbladder unremarkable. No biliary dilatation. Pancreas: Substantial enlargement of the hypoenhancing infiltrative pancreatic mass, currently 6.6 by 4.2 cm on image 25 of series 2 and previously about 2.7 by 2.0 cm on 05/20/2021. This mass abuts portions of the splenic artery, the junction of the SMV and the portal vein, and nearly occludes the splenic vein as shown for example on image 49 series 5. Spleen: Trace perisplenic ascites, otherwise unremarkable. Adrenals/Urinary Tract: Both adrenal glands appear normal. Stable small renal cysts warrant no further follow up. Subtle parenchymal hypoenhancement posteriorly in the right kidney upper pole on image 32 series 2, correlate with urine analysis in assessing for possible pyelonephritis. Distended urinary bladder. Stomach/Bowel: Unremarkable Vascular/Lymphatic: Vascular involvement of the enlarging pancreatic mass as noted above. Atherosclerosis is present, including aortoiliac atherosclerotic disease. Reproductive: Prostatomegaly. Other: No supplemental non-categorized findings. Musculoskeletal: Right total hip prosthesis noted. Acute or early subacute fracture of the L3 vertebral body with  involvement of the superior endplate and extension to the middle column, with 1-2 mm posterior bony retropulsion. We demonstrate nearly 50% loss of vertebral body height and some mild anterior fragmentation of the vertebral rim along with paraspinal edema/mild paraspinal hematoma. There is also a chronic compression fracture at the L4 vertebral level. Multilevel lumbar spondylosis and degenerative disc disease leading to multilevel impingement. Small umbilical hernia containing adipose tissue. Somewhat prominent piriformis and obturator internus musculature in the pelvis, left greater than right, cannot exclude mild sciatic notch impingement due to the left piriformis hypertrophy. IMPRESSION: 1. Substantial enlargement of the hypoenhancing infiltrative pancreatic mass, currently 6.6 by 4.2 cm, previously about 2.7 by 2.0  cm on 05/20/2021. This mass abuts portions of the splenic artery, the junction of the SMV and portal vein, and nearly occludes the splenic vein. 2. 4 new hypodense lesions in the right hepatic lobe are probably metastatic lesions. 3. Acute or early subacute fracture of the L3 vertebral body with involvement of the superior endplate and extension to the middle column, with nearly 50% loss of vertebral body height and some mild anterior fragmentation of the vertebral rim. 1-2 mm posterior bony retropulsion. Paraspinal edema noted. 4. Subtle parenchymal hypoenhancement posteriorly in the right kidney upper pole, correlate with urine analysis in assessing for possible pyelonephritis. 5. Distended urinary bladder. 6. Prostatomegaly. 7. Somewhat prominent piriformis and obturator internus musculature in the pelvis, left greater than right, cannot exclude left sciatic notch impingement due to the left piriformis hypertrophy. 8. Multilevel lumbar spondylosis and degenerative disc disease leading to multilevel impingement. 9. Small umbilical hernia containing adipose tissue. 10. Aortic atherosclerosis.  Aortic Atherosclerosis (ICD10-I70.0). Electronically Signed   By: Van Clines M.D.   On: 04/30/2022 16:41   DG Hip Unilat W or Wo Pelvis 2-3 Views Left  Result Date: 05/05/2022 CLINICAL DATA:  Fall.  Evaluate for fracture. EXAM: DG HIP (WITH OR WITHOUT PELVIS) 2-3V LEFT COMPARISON:  Right hip radiographs 05/21/2021, pelvis and right hip radiographs 05/20/2021 FINDINGS: Status post total right hip arthroplasty. There is diffuse decreased bone mineralization. No perihardware lucency is seen to indicate radiographic evidence of hardware failure. Mild superolateral left acetabular degenerative osteophytosis. The bilateral sacroiliac, left femoroacetabular, and pubic symphysis joint spaces are maintained. Small sclerotic focus within the left ilium is unchanged from 05/20/2021, compatible with a benign bone island. No acute fracture is seen.  No dislocation. Moderate to severe right L4-5 disc space narrowing and right lateral endplate osteophytes. 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 No acute fracture is seen. Electronically Signed   By: Yvonne Kendall M.D.   On: 05/15/2022 11:32   DG Chest Port 1 View  Result Date: 05/12/2022 CLINICAL DATA:  Sepsis EXAM: PORTABLE CHEST 1 VIEW COMPARISON:  05/20/2021 FINDINGS: Right IJ approach chest port in place with distal tip at the level of the distal SVC. The heart size and mediastinal contours are within normal limits. Aortic atherosclerosis. Both lungs are clear. The visualized skeletal structures are unremarkable. IMPRESSION: No active disease. Electronically Signed   By: Davina Poke D.O.   On: 05/01/2022 11:30   DG Shoulder Left  Result Date: 05/11/2022 CLINICAL DATA:  Fall.  Evaluate for fracture. EXAM: LEFT SHOULDER - 2+ VIEW COMPARISON:  None Available. FINDINGS: Severe acromioclavicular joint space narrowing with mild-to-moderate peripheral degenerative osteophytes. Mild glenohumeral joint space  narrowing. Mild peripheral glenoid degenerative osteophytosis. No acute fracture is seen. No dislocation. The visualized portion of the left lung is unremarkable. IMPRESSION: 1. No acute fracture. 2. Moderate acromioclavicular and mild glenohumeral osteoarthritis. Electronically Signed   By: Yvonne Kendall M.D.   On: 05/21/2022 11:29   CT HEAD WO CONTRAST (5MM)  Result Date: 05/15/2022 CLINICAL DATA:  Head trauma, minor (Age >= 65y); Neck trauma (Age >= 65y) EXAM: CT HEAD WITHOUT CONTRAST CT CERVICAL SPINE WITHOUT CONTRAST TECHNIQUE: Multidetector CT imaging of the head and cervical spine was performed following the standard protocol without intravenous contrast. Multiplanar CT image reconstructions of the cervical spine were also generated. RADIATION DOSE REDUCTION: This exam was performed according to the departmental dose-optimization program which includes automated exposure control, adjustment of the mA and/or kV according to  patient size and/or use of iterative reconstruction technique. COMPARISON:  None Available. FINDINGS: CT HEAD FINDINGS Brain: No evidence of acute infarction, hemorrhage, hydrocephalus, extra-axial collection or mass lesion/mass effect. Vascular: No hyperdense vessel identified. Skull: No acute fracture.  Left forehead contusion. Sinuses/Orbits: Paranasal sinus mucosal thickening. Other: No mastoid effusions. CT CERVICAL SPINE FINDINGS Alignment: Straightening.  No substantial sagittal subluxation. Skull base and vertebrae: Vertebral heights are maintained. No evidence of acute fracture. Soft tissues and spinal canal: No prevertebral fluid or swelling. No visible canal hematoma. Disc levels: Multilevel facet and uncovertebral hypertrophy there degrees neural foraminal stenosis. Multilevel degenerative disease. Upper chest: Visualized lung apices are clear. IMPRESSION: 1. No evidence of acute intracranial abnormality. 2. No evidence of acute fracture or traumatic malalignment in the  cervical spine. Electronically Signed   By: Margaretha Sheffield M.D.   On: 05/06/2022 11:23   CT Cervical Spine Wo Contrast  Result Date: 05/27/2022 CLINICAL DATA:  Head trauma, minor (Age >= 65y); Neck trauma (Age >= 65y) EXAM: CT HEAD WITHOUT CONTRAST CT CERVICAL SPINE WITHOUT CONTRAST TECHNIQUE: Multidetector CT imaging of the head and cervical spine was performed following the standard protocol without intravenous contrast. Multiplanar CT image reconstructions of the cervical spine were also generated. RADIATION DOSE REDUCTION: This exam was performed according to the departmental dose-optimization program which includes automated exposure control, adjustment of the mA and/or kV according to patient size and/or use of iterative reconstruction technique. COMPARISON:  None Available. FINDINGS: CT HEAD FINDINGS Brain: No evidence of acute infarction, hemorrhage, hydrocephalus, extra-axial collection or mass lesion/mass effect. Vascular: No hyperdense vessel identified. Skull: No acute fracture.  Left forehead contusion. Sinuses/Orbits: Paranasal sinus mucosal thickening. Other: No mastoid effusions. CT CERVICAL SPINE FINDINGS Alignment: Straightening.  No substantial sagittal subluxation. Skull base and vertebrae: Vertebral heights are maintained. No evidence of acute fracture. Soft tissues and spinal canal: No prevertebral fluid or swelling. No visible canal hematoma. Disc levels: Multilevel facet and uncovertebral hypertrophy there degrees neural foraminal stenosis. Multilevel degenerative disease. Upper chest: Visualized lung apices are clear. IMPRESSION: 1. No evidence of acute intracranial abnormality. 2. No evidence of acute fracture or traumatic malalignment in the cervical spine. Electronically Signed   By: Margaretha Sheffield M.D.   On: 05/27/2022 11:23      Assessment/Plan Principal Problem:   Rhabdomyolysis Active Problems:   UTI (urinary tract infection)   Severe sepsis (HCC)   Acute  pyelonephritis   Fall at home, initial encounter   Pancreatic cancer ([place])   HTN (hypertension)   Abdominal pain   Abnormal LFTs   HLD (hyperlipidemia)   Diabetes mellitus without complication ([place])   Schizophrenia ([place])   Depression with anxiety   Assessment and Plan:  Addendum: pt developed tachycardia with HR up to 130-150S. EKG showed artifacial effects from dyskinesia, dose not seem to be in A flutter. Seems to be SVT with PAC.  -will give prn metoprolol 5 mg prn q2h for HR > 125 -continue home oral metoprolol.  Rhabdomyolysis: CK [address]. GFR> 60. Has abnormal liver function. -Admitted to telemetry bed as inpatient -IVF: 2.5 L normal saline, then 125 cc/h -Repeat CK level in morning  Severe sepsis due to UTI/pyelonephritis: Patient meets criteria for severe sepsis with heart rate of 103, RR 24, WBC 16.8.  Lactic acid of 4.3 --> 2.8. -IV Rocephin (patient received 1 dose of cefepime in the ED) -Follow-up blood culture and urine culture -Trend lactic acid levels -Check a procalcitonin level  Fall at home, initial encounter: -pending  X-ray of L-spin -fall precaution  Pancreatic cancer [facility]): Patient has metastasized to pancreatic cancer.  Currently doing chemotherapy.  Patient states that he is receiving chemotherapy from [facility] oncology in [place].  Last dose was on Thursday.  Today CT scan of abdomen/pelvis showed enlarged cancer burden and worsening metastasized disease. Very unfortunately, this is not curable. -will consult palliative care  HTN (hypertension) -Holding nifedipine since patient is at high risk of developing hypotension due to severe sepsis -Continue metoprolol -IV hydralazine as needed  Abdominal pain: Likely multifactorial etiology, particularly due to metastasized pancreatic cancer. -Supportive care and pain control: As needed morphine and oxycodone  Abnormal LFTs: Likely multifactorial etiology, including metastasized to liver disease and  rhabdomyolysis -Avoid using Tylenol -Hold Crestor  HLD (hyperlipidemia) -Hold Crestor due to abnormal liver function  Diabetes mellitus without complication ([place]): Recent A1c 6.2, well controlled.  Patient taking metformin -Sliding scale insulin  Schizophrenia and  Depression with anxiety -Continue home medications: Risperidone, Ativan, Lamictal, Prozac, Luvox,    DVT ppx:  SQ Lovenox  Code Status: Full code per pt  Family Communication: I have tried to call his aunt twice without success.  Disposition Plan:  Anticipate discharge back to previous environment  Consults called:  none  Admission status and Level of care: Telemetry Medical:    as inpt         Dispo: The patient is from: Home              Anticipated d/c is to: Home              Anticipated d/c date is: 2 days              Patient currently is not medically stable to d/c.    Severity of Illness:  The appropriate patient status for this patient is INPATIENT. Inpatient status is judged to be reasonable and necessary in order to provide the required intensity of service to ensure the patient's safety. The patient's presenting symptoms, physical exam findings, and initial radiographic and laboratory data in the context of their chronic comorbidities is felt to place them at high risk for further clinical deterioration. Furthermore, it is not anticipated that the patient will be medically stable for discharge from the hospital within 2 midnights of admission.   * I certify that at the point of admission it is my clinical judgment that the patient will require inpatient hospital care spanning beyond 2 midnights from the point of admission due to high intensity of service, high risk for further deterioration and high frequency of surveillance required.*       Date of Service 05/12/2022    [facility]   If 7PM-7AM, please contact night-coverage [email or web address] 05/15/2022, 5:38 PM

## 2022-05-16 NOTE — Code Documentation (Signed)
CODE SEPSIS - PHARMACY COMMUNICATION  **Broad Spectrum Antibiotics should be administered within 1 hour of Sepsis diagnosis**  Time Code Sepsis Called/Page Received: 1136  Antibiotics Ordered: cefepime  Time of 1st antibiotic administration: [place] ,PharmD Clinical Pharmacist  04/30/2022  12:05 PM

## 2022-05-16 NOTE — ED Triage Notes (Signed)
[facility] reports pt coming from home for a fall. EMS came out last night and pt refused to go to hospital. Called today and pt was found on the floor covered in urine.

## 2022-05-16 NOTE — Progress Notes (Signed)
Central tele notified this writer patient tele reading afib heart rate sustained 130-150's, writer observed patient sitting up in bed eating dinner, verbalized no chest pain, dizziness. EKG obtained,  see flowsheet for vitals obtained.  Ivor Costa notified of above see MAR for new orders. 1940 Metoprolol tartrate '5mg'$  IVP administered for heart rate of 136. 1953 heart rate  95. Report given to [facility] RN oncoming nurse. Patient lying in bed with eyes open looking at football on television.

## 2022-05-16 NOTE — ED Notes (Signed)
ED TO INPATIENT HANDOFF REPORT  ED Nurse Name and Phone #: Baxter Flattery, RN  S Name/Age/Gender Pedro Mccoy 71 y.o. male Room/Bed: ED13A/ED13A  Code Status   Code Status: Prior  Home/SNF/Other Home Patient oriented to: self, place, time, and situation Is this baseline? Yes   Triage Complete: Triage complete  Chief Complaint Rhabdomyolysis [M62.82]  Triage Note [facility] reports pt coming from home for a fall. EMS came out last night and pt refused to go to hospital. Called today and pt was found on the floor covered in urine.   Allergies Allergies  Allergen Reactions   Pioglitazone Nausea Only   Ace Inhibitors    Codeine    Sitagliptin Nausea And Vomiting   Lithium Diarrhea    Level of Care/Admitting Diagnosis ED Disposition     ED Disposition  Admit   Condition  --   Comment  Hospital Area: [place] [[id number]]  Level of Care: Telemetry Medical [104]  Covid Evaluation: Asymptomatic - no recent exposure (last 10 days) testing not required  Diagnosis: Rhabdomyolysis [728.88.ICD-9-CM]  Admitting Physician: Ivor Costa [[id number]]  Attending Physician: Ivor Costa [[id number]]  Certification:: I certify this patient will need inpatient services for at least 2 midnights  Estimated Length of Stay: 2          B Medical/Surgery History Past Medical History:  Diagnosis Date   Hypertension    Hypertriglyceridemia .   Past Surgical History:  Procedure Laterality Date   NO PAST SURGERIES     TOTAL HIP ARTHROPLASTY Right 05/21/2021   Procedure: ANTERIOR HIP TOTAL ARTHROPLASTY;  Surgeon: Hessie Knows, MD;  Location: [facility] ORS;  Service: Orthopedics;  Laterality: Right;     A IV Location/Drains/Wounds Patient Lines/Drains/Airways Status     Active Line/Drains/Airways     Name Placement date Placement time Site Days   Peripheral IV 05/06/2022 20 G 1" Anterior;Distal;Right;Upper Arm 05/05/2022  1040  Arm  less than 1   Negative Pressure Wound Therapy Hip  Anterior;Proximal;Right 05/21/21  1404  --  360   External Urinary Catheter 05/21/2022  1048  --  less than 1   Incision (Closed) 05/21/21 Hip Right 05/21/21  1327  -- 360            Intake/Output Last 24 hours No intake or output data in the 24 hours ending 05/18/2022 1256  Labs/Imaging Results for orders placed or performed during the hospital encounter of 05/22/2022 (from the past 48 hour(s))  Lactic acid, plasma     Status: Abnormal   Collection Time: 05/09/2022 11:11 AM  Result Value Ref Range   Lactic Acid, Venous 4.3 (HH) 0.5 - 1.9 mmol/L    Comment: CRITICAL RESULT CALLED TO, READ BACK BY AND VERIFIED WITH Gowri Suchan DEL ROSSO '@1136'$  05/09/2022 MJU Performed at [facility], [address] [place], [place] [address]   Comprehensive metabolic panel     Status: Abnormal   Collection Time: 05/05/2022 11:11 AM  Result Value Ref Range   Sodium 140 135 - 145 mmol/L   Potassium 3.8 3.5 - 5.1 mmol/L   Chloride 105 98 - 111 mmol/L   CO2 21 (L) 22 - 32 mmol/L   Glucose, Bld 204 (H) 70 - 99 mg/dL    Comment: Glucose reference range applies only to samples taken after fasting for at least 8 hours.   BUN 17 8 - 23 mg/dL   Creatinine, Ser 1.19 0.61 - 1.24 mg/dL   Calcium 9.1 8.9 - 10.3 mg/dL   Total  Protein 6.8 6.5 - 8.1 g/dL   Albumin 3.8 3.5 - 5.0 g/dL   AST 389 (H) 15 - 41 U/L   ALT 69 (H) 0 - 44 U/L   Alkaline Phosphatase 104 38 - 126 U/L   Total Bilirubin 1.2 0.3 - 1.2 mg/dL   GFR, Estimated >60 >60 mL/min    Comment: (NOTE) Calculated using the CKD-EPI Creatinine Equation (2021)    Anion gap 14 5 - 15    Comment: Performed at [facility], [address] [place], [place] [address]  CBC with Differential     Status: Abnormal   Collection Time: 05/10/2022 11:11 AM  Result Value Ref Range   WBC 16.8 (H) 4.0 - 10.5 K/uL   RBC 5.27 4.22 - 5.81 MIL/uL   Hemoglobin 14.8 13.0 - 17.0 g/dL   HCT 44.1 39.0 - 52.0 %   MCV 83.7 80.0 - 100.0 fL   MCH 28.1 26.0 - 34.0 pg    MCHC 33.6 30.0 - 36.0 g/dL   RDW 12.7 11.5 - 15.5 %   Platelets 155 150 - 400 K/uL   nRBC 0.0 0.0 - 0.2 %   Neutrophils Relative % 90 %   Neutro Abs 15.3 (H) 1.7 - 7.7 K/uL   Lymphocytes Relative 2 %   Lymphs Abs 0.3 (L) 0.7 - 4.0 K/uL   Monocytes Relative 5 %   Monocytes Absolute 0.8 0.1 - 1.0 K/uL   Eosinophils Relative 1 %   Eosinophils Absolute 0.1 0.0 - 0.5 K/uL   Basophils Relative 1 %   Basophils Absolute 0.1 0.0 - 0.1 K/uL   WBC Morphology DOHLE BODIES     Comment: MILD LEFT SHIFT (1-5% METAS, OCC MYELO, OCC BANDS)   RBC Morphology MORPHOLOGY UNREMARKABLE    Smear Review MORPHOLOGY UNREMARKABLE    Immature Granulocytes 1 %   Abs Immature Granulocytes 0.10 (H) 0.00 - 0.07 K/uL    Comment: Performed at [facility], [address] [place], [place] [address]  Protime-INR     Status: Abnormal   Collection Time: 05/18/2022 11:11 AM  Result Value Ref Range   Prothrombin Time 16.4 (H) 11.4 - 15.2 seconds   INR 1.3 (H) 0.8 - 1.2    Comment: (NOTE) INR goal varies based on device and disease states. Performed at [facility], [address] [place], [place] [address]   APTT     Status: None   Collection Time: 05/06/2022 11:11 AM  Result Value Ref Range   aPTT 34 24 - 36 seconds    Comment: Performed at [facility], [address] [place], [place] [address]  CK     Status: Abnormal   Collection Time: 05/20/2022 11:11 AM  Result Value Ref Range   Total CK 22,395 (H) 49 - 397 U/L    Comment: RESULT CONFIRMED BY MANUAL DILUTION MJU Performed at [facility], [address], [place], [place] [address]    DG Hip Malvin Johns or [place] Pelvis 2-3 Views Left  Result Date: 05/18/2022 CLINICAL DATA:  Fall.  Evaluate for fracture. EXAM: DG HIP (WITH OR WITHOUT PELVIS) 2-3V LEFT COMPARISON:  Right hip radiographs 05/21/2021, pelvis and right hip radiographs 05/20/2021 FINDINGS: Status post total right hip arthroplasty. There is diffuse decreased bone  mineralization. No perihardware lucency is seen to indicate radiographic evidence of hardware failure. Mild superolateral left acetabular degenerative osteophytosis. The bilateral sacroiliac, left femoroacetabular, and pubic symphysis joint spaces are maintained. Small sclerotic focus within the left  ilium is unchanged from 05/20/2021, compatible with a benign bone island. No acute fracture is seen.  No dislocation. Moderate to severe right L4-5 disc space narrowing and right lateral endplate osteophytes. 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 No acute fracture is seen. Electronically Signed   By: Yvonne Kendall M.D.   On: 05/17/2022 11:32   DG Chest Port 1 View  Result Date: 05/15/2022 CLINICAL DATA:  Sepsis EXAM: PORTABLE CHEST 1 VIEW COMPARISON:  05/20/2021 FINDINGS: Right IJ approach chest port in place with distal tip at the level of the distal SVC. The heart size and mediastinal contours are within normal limits. Aortic atherosclerosis. Both lungs are clear. The visualized skeletal structures are unremarkable. IMPRESSION: No active disease. Electronically Signed   By: Davina Poke D.O.   On: 05/23/2022 11:30   DG Shoulder Left  Result Date: 05/25/2022 CLINICAL DATA:  Fall.  Evaluate for fracture. EXAM: LEFT SHOULDER - 2+ VIEW COMPARISON:  None Available. FINDINGS: Severe acromioclavicular joint space narrowing with mild-to-moderate peripheral degenerative osteophytes. Mild glenohumeral joint space narrowing. Mild peripheral glenoid degenerative osteophytosis. No acute fracture is seen. No dislocation. The visualized portion of the left lung is unremarkable. IMPRESSION: 1. No acute fracture. 2. Moderate acromioclavicular and mild glenohumeral osteoarthritis. Electronically Signed   By: Yvonne Kendall M.D.   On: 05/15/2022 11:29   CT HEAD WO CONTRAST (5MM)  Result Date: 05/25/2022 CLINICAL DATA:  Head trauma, minor (Age >= 65y); Neck trauma  (Age >= 65y) EXAM: CT HEAD WITHOUT CONTRAST CT CERVICAL SPINE WITHOUT CONTRAST TECHNIQUE: Multidetector CT imaging of the head and cervical spine was performed following the standard protocol without intravenous contrast. Multiplanar CT image reconstructions of the cervical spine were also generated. RADIATION DOSE REDUCTION: This exam was performed according to the departmental dose-optimization program which includes automated exposure control, adjustment of the mA and/or kV according to patient size and/or use of iterative reconstruction technique. COMPARISON:  None Available. FINDINGS: CT HEAD FINDINGS Brain: No evidence of acute infarction, hemorrhage, hydrocephalus, extra-axial collection or mass lesion/mass effect. Vascular: No hyperdense vessel identified. Skull: No acute fracture.  Left forehead contusion. Sinuses/Orbits: Paranasal sinus mucosal thickening. Other: No mastoid effusions. CT CERVICAL SPINE FINDINGS Alignment: Straightening.  No substantial sagittal subluxation. Skull base and vertebrae: Vertebral heights are maintained. No evidence of acute fracture. Soft tissues and spinal canal: No prevertebral fluid or swelling. No visible canal hematoma. Disc levels: Multilevel facet and uncovertebral hypertrophy there degrees neural foraminal stenosis. Multilevel degenerative disease. Upper chest: Visualized lung apices are clear. IMPRESSION: 1. No evidence of acute intracranial abnormality. 2. No evidence of acute fracture or traumatic malalignment in the cervical spine. Electronically Signed   By: Margaretha Sheffield M.D.   On: 05/03/2022 11:23   CT Cervical Spine Wo Contrast  Result Date: 05/09/2022 CLINICAL DATA:  Head trauma, minor (Age >= 65y); Neck trauma (Age >= 65y) EXAM: CT HEAD WITHOUT CONTRAST CT CERVICAL SPINE WITHOUT CONTRAST TECHNIQUE: Multidetector CT imaging of the head and cervical spine was performed following the standard protocol without intravenous contrast. Multiplanar CT image  reconstructions of the cervical spine were also generated. RADIATION DOSE REDUCTION: This exam was performed according to the departmental dose-optimization program which includes automated exposure control, adjustment of the mA and/or kV according to patient size and/or use of iterative reconstruction technique. COMPARISON:  None Available. FINDINGS: CT HEAD FINDINGS Brain: No evidence of acute infarction, hemorrhage, hydrocephalus, extra-axial collection or mass lesion/mass effect. Vascular: No hyperdense vessel identified.  Skull: No acute fracture.  Left forehead contusion. Sinuses/Orbits: Paranasal sinus mucosal thickening. Other: No mastoid effusions. CT CERVICAL SPINE FINDINGS Alignment: Straightening.  No substantial sagittal subluxation. Skull base and vertebrae: Vertebral heights are maintained. No evidence of acute fracture. Soft tissues and spinal canal: No prevertebral fluid or swelling. No visible canal hematoma. Disc levels: Multilevel facet and uncovertebral hypertrophy there degrees neural foraminal stenosis. Multilevel degenerative disease. Upper chest: Visualized lung apices are clear. IMPRESSION: 1. No evidence of acute intracranial abnormality. 2. No evidence of acute fracture or traumatic malalignment in the cervical spine. Electronically Signed   By: Margaretha Sheffield M.D.   On: 05/01/2022 11:23    Pending Labs Unresulted Labs (From admission, onward)     Start     Ordered   05/07/2022 1136  Culture, blood (single)  (Undifferentiated -> Now sepsis confirmed (treatment and sepsis specific nursing orders))  ONCE - STAT,   STAT        05/07/2022 1136   05/11/2022 1049  Lactic acid, plasma  (Undifferentiated presentation (screening labs and basic nursing orders))  Now then every 2 hours,   STAT      05/07/2022 1048   05/14/2022 1049  Urinalysis, Complete w Microscopic Urine, Clean Catch  (Undifferentiated presentation (screening labs and basic nursing orders))  ONCE - URGENT,   URGENT         05/02/2022 1048            Vitals/Pain Today's Vitals   05/14/2022 1042 05/17/2022 1045 05/11/2022 1045 05/06/2022 1130  BP:  (!) 156/85 (!) 156/85 128/77  Pulse:  (!) 102 (!) 103 (!) 103  Resp:  (!) '24 20 18  '$ Temp:   98.5 F (36.9 C)   TempSrc:   Oral   SpO2:  96% 96% 98%  Weight: 181 lb (82.1 kg)     Height: '6\' 1"'$  (1.854 m)     PainSc:        Isolation Precautions No active isolations  Medications Medications  0.9 %  sodium chloride infusion (has no administration in time range)  sodium chloride 0.9 % bolus 1,000 mL (0 mLs Intravenous Stopped 05/02/2022 1246)  sodium chloride 0.9 % bolus 1,500 mL (0 mLs Intravenous Stopped 05/03/2022 1256)  ceFEPIme (MAXIPIME) 2 g in sodium chloride 0.9 % 100 mL IVPB (0 g Intravenous Stopped 05/09/2022 1246)    Mobility walks Moderate fall risk   Focused Assessments Cardiac Assessment Handoff:    Lab Results  Component Value Date   CKTOTAL 22,395 (H) 05/15/2022   TROPONINI <0.03 09/09/2018   No results found for: "DDIMER" Does the Patient currently have chest pain? No    R Recommendations: See Admitting Provider Note  Report given to:   Additional Notes:

## 2022-05-16 NOTE — ED Provider Notes (Signed)
[facility] Provider Note    Event Date/Time   First MD Initiated Contact with Patient 05/17/2022 1042     (approximate)   History   Fall   HPI  Pedro Mccoy is a 71 y.o. male history of schizophrenia as well as pancreatic cancer presents to the ER after being found down.  Reportedly fell around 4 AM.  He does not remember how or why he fell.     Physical Exam   Triage Vital Signs: ED Triage Vitals  Enc Vitals Group     BP 05/05/2022 1045 (!) 156/85     Pulse Rate 05/14/2022 1045 (!) 103     Resp 05/12/2022 1045 20     Temp 05/05/2022 1045 98.5 F (36.9 C)     Temp Source 05/08/2022 1045 Oral     SpO2 05/12/2022 1045 96 %     Weight 05/09/2022 1042 181 lb (82.1 kg)     Height 05/06/2022 1042 '6\' 1"'$  (1.854 m)     Head Circumference --      Peak Flow --      Pain Score 05/19/2022 1041 0     Pain Loc --      Pain Edu? --      Excl. in [place]? --     Most recent vital signs: Vitals:   05/12/2022 1305 05/20/2022 1340  BP: (!) 157/93 (!) 146/90  Pulse: (!) 106 (!) 110  Resp: (!) 24 20  Temp: 99.5 F (37.5 C) 99.5 F (37.5 C)  SpO2: 96% 97%     Constitutional: Alert, chronically ill appearing,  Eyes: Conjunctivae are normal. Eomi, no proptosis  Head: Atraumatic. Nose: No congestion/rhinnorhea. Mouth/Throat: Mucous membranes are moist.   Neck: Painless ROM.  Cardiovascular:   Good peripheral circulation. Respiratory: Normal respiratory effort.  No retractions.  Gastrointestinal: Soft and nontender.  Musculoskeletal:  no deformity Neurologic:  MAE spontaneously. No gross focal neurologic deficits are appreciated. TD trmor present Skin: Began stations of pressure ulceration changes to the left shoulder and left side of his face. Psychiatric: Mood and affect are normal. Speech and behavior are normal.    ED Results / Procedures / Treatments   Labs (all labs ordered are listed, but only abnormal results are displayed) Labs Reviewed  LACTIC ACID, PLASMA -  Abnormal; Notable for the following components:      Result Value   Lactic Acid, Venous 4.3 (*)    All other components within normal limits  LACTIC ACID, PLASMA - Abnormal; Notable for the following components:   Lactic Acid, Venous 2.8 (*)    All other components within normal limits  COMPREHENSIVE METABOLIC PANEL - Abnormal; Notable for the following components:   CO2 21 (*)    Glucose, Bld 204 (*)    AST 389 (*)    ALT 69 (*)    All other components within normal limits  CBC WITH DIFFERENTIAL/PLATELET - Abnormal; Notable for the following components:   WBC 16.8 (*)    Neutro Abs 15.3 (*)    Lymphs Abs 0.3 (*)    Abs Immature Granulocytes 0.10 (*)    All other components within normal limits  PROTIME-INR - Abnormal; Notable for the following components:   Prothrombin Time 16.4 (*)    INR 1.3 (*)    All other components within normal limits  URINALYSIS, COMPLETE (UACMP) WITH MICROSCOPIC - Abnormal; Notable for the following components:   Color, Urine YELLOW (*)    APPearance HAZY (*)  Glucose, UA 150 (*)    Hgb urine dipstick LARGE (*)    Ketones, ur 20 (*)    Protein, ur 100 (*)    Nitrite POSITIVE (*)    Bacteria, UA MANY (*)    All other components within normal limits  CK - Abnormal; Notable for the following components:   Total CK 22,395 (*)    All other components within normal limits  GLUCOSE, CAPILLARY - Abnormal; Notable for the following components:   Glucose-Capillary 182 (*)    All other components within normal limits  CULTURE, BLOOD (SINGLE)  URINE CULTURE  APTT  LIPASE, BLOOD  LACTIC ACID, PLASMA  LACTIC ACID, PLASMA  LACTIC ACID, PLASMA  PROCALCITONIN     EKG  ED ECG REPORT I, Merlyn Lot, the attending physician, personally viewed and interpreted this ECG.   Date: 05/15/2022  EKG Time: 10:49  Rate: 100  Rhythm: sinus  Axis: left  Intervals: normal  ST&T Change: no stemi    RADIOLOGY Please see ED Course for my review and  interpretation.  I personally reviewed all radiographic images ordered to evaluate for the above acute complaints and reviewed radiology reports and findings.  These findings were personally discussed with the patient.  Please see medical record for radiology report.    PROCEDURES:  Critical Care performed: Yes, see critical care procedure note(s)  .Critical Care  Performed by: Merlyn Lot, MD Authorized by: Merlyn Lot, MD   Critical care provider statement:    Critical care time (minutes):  35   Critical care was necessary to treat or prevent imminent or life-threatening deterioration of the following conditions:  Metabolic crisis and sepsis   Critical care was time spent personally by me on the following activities:  Ordering and performing treatments and interventions, ordering and review of laboratory studies, ordering and review of radiographic studies, pulse oximetry, re-evaluation of patient's condition, review of old charts, obtaining history from patient or surrogate, examination of patient, evaluation of patient's response to treatment, discussions with primary provider, discussions with consultants and development of treatment plan with patient or surrogate    MEDICATIONS ORDERED IN ED: Medications  0.9 %  sodium chloride infusion ( Intravenous New Bag/Given 05/13/2022 1346)  ondansetron (ZOFRAN) injection 4 mg (has no administration in time range)  hydrALAZINE (APRESOLINE) injection 5 mg (has no administration in time range)  ibuprofen (ADVIL) tablet 200 mg (has no administration in time range)  insulin aspart (novoLOG) injection 0-9 Units (has no administration in time range)  insulin aspart (novoLOG) injection 0-5 Units (has no administration in time range)  morphine (PF) 2 MG/ML injection 1 mg (1 mg Intravenous Given 05/10/2022 1514)  oxyCODONE (Oxy IR/ROXICODONE) immediate release tablet 5 mg (has no administration in time range)  enoxaparin (LOVENOX) injection  40 mg (40 mg Subcutaneous Given 05/15/2022 1513)  sodium chloride 0.9 % bolus 1,000 mL (0 mLs Intravenous Stopped 05/15/2022 1246)  sodium chloride 0.9 % bolus 1,500 mL (0 mLs Intravenous Stopped 05/10/2022 1256)  ceFEPIme (MAXIPIME) 2 g in sodium chloride 0.9 % 100 mL IVPB (0 g Intravenous Stopped 05/09/2022 1246)     IMPRESSION / MDM / [place] / ED COURSE  I reviewed the triage vital signs and the nursing notes.                              Differential diagnosis includes, but is not limited to, UTI, sepsis, electrolyte abnormality, rhabdo, fracture,  contusion, SDH, syncope  Patient presenting to the ER for evaluation of symptoms as described above.  Based on symptoms, risk factors and considered above differential, this presenting complaint could reflect a potentially life-threatening illness therefore the patient will be placed on continuous pulse oximetry and telemetry for monitoring.  Laboratory evaluation will be sent to evaluate for the above complaints.      Clinical Course as of 04/30/2022 1537  Fri May 16, 2022  1112 CT head on my review and interpretation does not show any evidence of SDH or IPH. [PR]  1135 CT Cervical Spine Wo Contrast [PR]  4332 X-rays without evidence of fracture.  Patient's lactate is significantly elevated 4.  Give 30 cc/kg of IV fluid per sepsis protocol as well as ordered broad-spectrum antibiotics.  Suspect this may be secondary to dehydration but given reported foul-smelling urine will cover for urinary tract source.  Does have leukocytosis. [PR]  1235 Patient with significantly elevated CK presentation more consistent with rhabdo given prolonged downtime.  Patient reassessed does appear well perfused.  Receiving IV fluids.  Still awaiting urinalysis.  Will consult hospitalist for admission. [PR]  1241 Patient now complaining of back pain and side pain will order CT imaging to evaluate for traumatic injury. [PR]    Clinical Course User Index [PR]  Merlyn Lot, MD   3:36 PM Case discussed in consultation with hospitalist. Plan was admit pending ct abd/pelv.   Patient was moved to inpatient floor prior to completing CT imaging ordered while in the ER.Marland Kitchen  Have called CT to expedite.  FINAL CLINICAL IMPRESSION(S) / ED DIAGNOSES   Final diagnoses:  Traumatic rhabdomyolysis, initial encounter [facility])     Rx / DC Orders   ED Discharge Orders     None        Note:  This document was prepared using Dragon voice recognition software and may include unintentional dictation errors.    Merlyn Lot, MD 05/14/2022 1537

## 2022-05-16 NOTE — Consult Note (Signed)
PHARMACY -  BRIEF ANTIBIOTIC NOTE   Pharmacy has received consult(s) for cefepime from an ED provider.  The patient's profile has been reviewed for ht/wt/allergies/indication/available labs.    One time order(s) placed for cefepime 2g IV x1  Further antibiotics/pharmacy consults should be ordered by admitting physician if indicated.                       Thank you, Darrick Penna 05/22/2022  12:05 PM

## 2022-05-17 DIAGNOSIS — N10 Acute tubulo-interstitial nephritis: Secondary | ICD-10-CM | POA: Diagnosis not present

## 2022-05-17 DIAGNOSIS — I484 Atypical atrial flutter: Secondary | ICD-10-CM

## 2022-05-17 DIAGNOSIS — R6521 Severe sepsis with septic shock: Secondary | ICD-10-CM

## 2022-05-17 DIAGNOSIS — A419 Sepsis, unspecified organism: Secondary | ICD-10-CM

## 2022-05-17 DIAGNOSIS — I4892 Unspecified atrial flutter: Secondary | ICD-10-CM | POA: Clinically undetermined

## 2022-05-17 DIAGNOSIS — M6282 Rhabdomyolysis: Secondary | ICD-10-CM | POA: Diagnosis not present

## 2022-05-17 DIAGNOSIS — S32000A Wedge compression fracture of unspecified lumbar vertebra, initial encounter for closed fracture: Secondary | ICD-10-CM | POA: Diagnosis present

## 2022-05-17 LAB — CBC
HCT: 36.8 % — ABNORMAL LOW (ref 39.0–52.0)
Hemoglobin: 12.6 g/dL — ABNORMAL LOW (ref 13.0–17.0)
MCH: 28.1 pg (ref 26.0–34.0)
MCHC: 34.2 g/dL (ref 30.0–36.0)
MCV: 82 fL (ref 80.0–100.0)
Platelets: 102 10*3/uL — ABNORMAL LOW (ref 150–400)
RBC: 4.49 MIL/uL (ref 4.22–5.81)
RDW: 12.8 % (ref 11.5–15.5)
WBC: 13.4 10*3/uL — ABNORMAL HIGH (ref 4.0–10.5)
nRBC: 0 % (ref 0.0–0.2)

## 2022-05-17 LAB — COMPREHENSIVE METABOLIC PANEL
ALT: 67 U/L — ABNORMAL HIGH (ref 0–44)
AST: 274 U/L — ABNORMAL HIGH (ref 15–41)
Albumin: 2.8 g/dL — ABNORMAL LOW (ref 3.5–5.0)
Alkaline Phosphatase: 70 U/L (ref 38–126)
Anion gap: 11 (ref 5–15)
BUN: 17 mg/dL (ref 8–23)
CO2: 16 mmol/L — ABNORMAL LOW (ref 22–32)
Calcium: 8 mg/dL — ABNORMAL LOW (ref 8.9–10.3)
Chloride: 109 mmol/L (ref 98–111)
Creatinine, Ser: 0.83 mg/dL (ref 0.61–1.24)
GFR, Estimated: >60 mL/min (ref >60)
Glucose, Bld: 154 mg/dL — ABNORMAL HIGH (ref 70–99)
Potassium: 3.1 mmol/L — ABNORMAL LOW (ref 3.5–5.1)
Sodium: 136 mmol/L (ref 135–145)
Total Bilirubin: 1.2 mg/dL (ref 0.3–1.2)
Total Protein: 5.3 g/dL — ABNORMAL LOW (ref 6.5–8.1)

## 2022-05-17 LAB — CK: Total CK: 11093 U/L — ABNORMAL HIGH (ref 49–397)

## 2022-05-17 LAB — GLUCOSE, CAPILLARY
Glucose-Capillary: 150 mg/dL — ABNORMAL HIGH (ref 70–99)
Glucose-Capillary: 184 mg/dL — ABNORMAL HIGH (ref 70–99)
Glucose-Capillary: 158 mg/dL — ABNORMAL HIGH (ref 70–99)
Glucose-Capillary: 135 mg/dL — ABNORMAL HIGH (ref 70–99)

## 2022-05-17 LAB — PROCALCITONIN: Procalcitonin: 22.9 ng/mL

## 2022-05-17 LAB — LACTIC ACID, PLASMA
Lactic Acid, Venous: 2.3 mmol/L (ref 0.5–1.9)
Lactic Acid, Venous: 1.8 mmol/L (ref 0.5–1.9)
Lactic Acid, Venous: 1.5 mmol/L (ref 0.5–1.9)

## 2022-05-17 MED ORDER — SODIUM CHLORIDE 0.9 % IV BOLUS
500.0000 mL | Freq: Once | INTRAVENOUS | Status: AC
  Administered 2022-05-17: 500 mL via INTRAVENOUS

## 2022-05-17 MED ORDER — POTASSIUM CHLORIDE CRYS ER 20 MEQ PO TBCR
40.0000 meq | EXTENDED_RELEASE_TABLET | Freq: Once | ORAL | Status: AC
  Administered 2022-05-17: 40 meq via ORAL
  Filled 2022-05-17: qty 2

## 2022-05-17 MED ORDER — LIDOCAINE 5 % EX PTCH
1.0000 | MEDICATED_PATCH | CUTANEOUS | Status: DC
  Administered 2022-05-17 – 2022-05-31 (×13): 1 via TRANSDERMAL
  Filled 2022-05-17 (×15): qty 1

## 2022-05-17 NOTE — Progress Notes (Addendum)
[facility] Progress Note  Patient: Lenorris Karger    XTG:[id number]  DOA: 05/01/2022    Date of Service: the patient was seen and examined on 05/17/2022  Brief hospital course: 71 year old male with past medical history of metastatic pancreatic cancer on chemotherapy, hypertension, diabetes mellitus, schizophrenia and tardive dyskinesia presented to the emergency room on 11/17 with complaints of weakness, fall x2 earlier that day and abdominal pain.  Work-up with septic shock secondary to pyelonephritis, acute versus subacute fracture of L3 vertebral body and rhabdomyolysis with CPK of 22,000.  Patient admitted to the hospitalist service and started on IV fluids and antibiotics.  Following admission, on the evening of 11/17, patient went to rapid atrial flutter.  Treated with metoprolol and IV fluids.  Assessment and Plan: Assessment and Plan: * Rhabdomyolysis Secondary to fall and some likely infectious component?  Responding well to IV fluids and down to 11,000 today  Septic shock ([place]) Meets criteria for septic shock on admission given urinary source with pyelonephritis seen on CT, lactic acid level of 4.3, white blood cell count of 16.8, tachypnea and tachycardia.  Patient started on aggressive fluid resuscitation with IV fluids and antibiotics.  Blood and urine cultures are pending.  Noted significantly elevated procalcitonin of 22.9.  Following fluid resuscitation, lactic acid level has started to trend downward, but then trended back upward, and down to 1.5 x 11/18 afternoon.    Acute pyelonephritis As above.  Urine cultures are pending.  Atrial flutter ([place]) On metoprolol plus as needed Lopressor.  Lumbar compression fracture, closed, initial encounter ([place]) Once rhabdomyolysis and sepsis stabilized, will ask IR to evaluate for kyphoplasty.  Possibly this could be related to metastases as well  Pancreatic cancer ([place]) Metastatic mets to the liver.  Oncology following with  recent chemotherapy  HTN (hypertension) Blood pressures initially elevated, but since then dropped down.  Schizophrenia ([place]) Continue home medications  Diabetes mellitus without complication (HCC) Sliding scale  Abdominal pain No evidence of pancreatitis or acute pancreatic obstruction.  Suspect may be secondary to referred pain from pyelonephritis versus compression fracture  Abnormal LFTs Mild, likely secondary to his liver metastases and also some shock liver from hypotension  Fall at home, initial encounter Secondary to weakness, rhabdo and sepsis.  Physical therapy see as patient improves       Body mass index is 23.88 kg/m.        Consultants: None  Procedures: None  Antimicrobials: IV Rocephin 11/17-present  Code Status: Full code   Subjective: Patient resting comfortably  Objective: Noted episodes of tachycardia as well as hypotension earlier Vitals:   05/17/22 1536 05/17/22 1559  BP: (!) 131/106   Pulse: (!) 125 91  Resp:    Temp:    SpO2:      Intake/Output Summary (Last 24 hours) at 05/17/2022 1614 Last data filed at 05/17/2022 1500 Gross per 24 hour  Intake 2445.79 ml  Output 1200 ml  Net 1245.79 ml   Filed Weights   05/21/2022 1042  Weight: 82.1 kg   Body mass index is 23.88 kg/m.  Exam:  General: Somnolent, no acute distress HEENT: Normocephalic, atraumatic, mucous membranes are dry Cardiovascular: Regular rate and rhythm, borderline tachycardia Respiratory: Clear to auscultation bilaterally Abdomen: Soft, nontender, nondistended, hypoactive bowel sounds Musculoskeletal: No clubbing or cyanosis or edema Skin: No skin breaks, tears or lesions Psychiatry: Currently resting comfortably, no evidence of acute psychoses Neurology: No immediate focal deficits  Data Reviewed: Lactic acid level down to 1.5.  CK down  to 11,000.  Potassium 3.1.  Creatinine 0.83.  Improving transaminases, white blood cell count down to  13  Disposition:  Status is: Inpatient Remains inpatient appropriate because:  -Treatment of sepsis -Treatment of rhabdo -Atrial flutter    Anticipated discharge date: 11/21  Family Communication: We will call family DVT Prophylaxis: enoxaparin (LOVENOX) injection 40 mg Start: 05/19/2022 1600  35 minutes spent in the care of this critically ill patient including medical decision making, bedside examination and review of patient's labs  Author: Annita Brod ,MD 05/17/2022 4:14 PM  To reach On-call, see care teams to locate the attending and reach out via [email or web address]. Between 7PM-7AM, please contact night-coverage If you still have difficulty reaching the attending provider, please page the [facility] (Director on Call) for [facility] on amion for assistance.

## 2022-05-17 NOTE — Assessment & Plan Note (Signed)
Once rhabdomyolysis and sepsis stabilized, will ask IR to evaluate for kyphoplasty.  Possibly this could be related to metastases as well

## 2022-05-17 NOTE — Assessment & Plan Note (Signed)
Sliding scale:

## 2022-05-17 NOTE — Assessment & Plan Note (Signed)
Secondary to fall and some likely infectious component?  Responding well to IV fluids and down to 800.  We will stop IV fluids as I am concerned about him getting volume overloaded.

## 2022-05-17 NOTE — Assessment & Plan Note (Signed)
No evidence of pancreatitis or acute pancreatic obstruction.  Suspect may be secondary to referred pain from pyelonephritis versus compression fracture

## 2022-05-17 NOTE — Assessment & Plan Note (Signed)
Mild, likely secondary to his liver metastases and also some shock liver from hypotension

## 2022-05-17 NOTE — Assessment & Plan Note (Signed)
Blood pressures initially elevated, but since then dropped down.

## 2022-05-17 NOTE — Assessment & Plan Note (Signed)
Secondary to weakness, rhabdo and sepsis.  Physical therapy see as patient improves

## 2022-05-17 NOTE — Assessment & Plan Note (Signed)
-   Continue home medications 

## 2022-05-17 NOTE — Assessment & Plan Note (Addendum)
Meets criteria for septic shock on admission given urinary source with pyelonephritis seen on CT, lactic acid level of 4.3, white blood cell count of 16.8, tachypnea and tachycardia.  Patient started on aggressive fluid resuscitation with IV fluids and antibiotics.  Blood and urine cultures are pending.  Noted significantly elevated procalcitonin of 22.9.  Following fluid resuscitation, lactic acid level has started to trend downward, but then trended back upward, and down to 1.5 x 11/18 afternoon.

## 2022-05-17 NOTE — Assessment & Plan Note (Signed)
On metoprolol plus as needed Lopressor.

## 2022-05-17 NOTE — Assessment & Plan Note (Signed)
As above.  Urine cultures positive for MSSA.  Infectious disease to see.

## 2022-05-17 NOTE — Hospital Course (Addendum)
71 year old male with past medical history of metastatic pancreatic cancer on chemotherapy, hypertension, diabetes mellitus, schizophrenia and tardive dyskinesia presented to the emergency room on 11/17 with complaints of weakness, fall x2 earlier that day and abdominal pain.  Work-up with septic shock secondary to pyelonephritis, acute versus subacute fracture of L3 vertebral body and rhabdomyolysis with CPK of 22,000.  Patient admitted to the hospitalist service and started on IV fluids and antibiotics.    Following admission, on the evening of 11/17, patient went to rapid atrial flutter.  Attempted treatment with metoprolol and IV fluids.   11/19: tachycardia persisting and patient started on Cardizem gtt. Urine cultures positive for MSSA. 11/20: HR better, remained on cardizem gtt  11/21: off Cardizem gtt, on oral metoprolol and cardizem. ID saw pt for (+)UCx MSSA and cocnern for CT abd/pelvis w/ paraspinal edema, 1 set BCx (+) likely contaminant, advised consider MRI L spine.  11/23: White count continues to rise. No fevers. MRI L spine: L2L3 discitis noted, epidural abscess. Large psoas abscess. 11/24:  to IR, psoas abscess drained 11/25: White count downtrending. Neurosurgery consult - no surgical intervention planned d/t high risk, consider stabilization but may not be appropriate given metastatic disease  11/26: developed AKI Cr 1.15 --> 7.10 and metabolic encephalopathy.  11/28: Cr continues to worsen to 3.16, consulted nephrology 11/29: remains tachycardic in 120s, Cr 4.05, WBC up. Sister and patient agree for DNR status. Deferring dialysis at this time per nephrology. Holding diuresis. Hypotensive pm and overnight 11/30: remains low BP, night coverage spoke w/ sister re: pressors, no pressors. Remains low today. Cr worse again 4.93. Nephrology s/o, no dialysis. Spoke in person w/ sister later I the day, see IPAL note. Discussion of options - decision was made for comfort measures only   12/01-12/02: remains on comfort measures, resting and in no distress   Overall goals (see palliative notes for details): transfer to nursing facility, treat the treatable, "would not want to live on a machine or feeding tube," DNR as of 11/29. Confirmed on phone w/ sister.   Consultants:  Infectious Discase  Palliative care Interventional Radiology  Neurosurgery Nephrology  Cardiology for TEE  Procedures: 05/23/22: CT guided drainage L psoas abscess w/ IR       ASSESSMENT & PLAN:   Principal Problem:   Comfort measures only status Active Problems:   Septic shock ([place])   UTI (urinary tract infection)   Acute pyelonephritis   Rhabdomyolysis   Atrial flutter (HCC)   Acute urinary retention   Pancreatic cancer ([place])   HLD (hyperlipidemia)   Lumbar compression fracture, closed, initial encounter ([place])   HTN (hypertension)   Schizophrenia ([place])   Depression with anxiety   Diabetes mellitus without complication ([place])   Abdominal pain   Abnormal LFTs   Fall at home, initial encounter   Lumbar discitis   Vertebral osteomyelitis ([place])   Psoas abscess, left (HCC)   Spinal epidural abscess   AKI (acute kidney injury) ([place])   Acute metabolic encephalopathy   Protein-calorie malnutrition, severe   Comfort measures only  Pain and anxiety control  Frequent assessments for pain/anxiety or other discomfort Call sister if he is deteriorating - she would like to be present if needed / as she is able

## 2022-05-17 NOTE — Assessment & Plan Note (Signed)
Metastatic mets to the liver.  Oncology following with recent chemotherapy

## 2022-05-17 NOTE — Plan of Care (Addendum)
02.32am Patient HR 140s-160s,PRN Metoprol '5mg'$  IV given.Patient 's HR stabilized for a while ranging   in the 90s-115.  03.34am HR started going back up to  150s-160s sustaining BP 98/71 ,MAP 90.Patient encouraged to bear down  with no desired effect. 03.45am Provider informed 04.33am dose of metoprol given monitoring patient. 05.25am  HR 98,Provider notified.  Problem: Education: Goal: Knowledge of General Education information will improve Description: Including pain rating scale, medication(s)/side effects and non-pharmacologic comfort measures Outcome: Progressing   Problem: Health Behavior/Discharge Planning: Goal: Ability to manage health-related needs will improve Outcome: Progressing   Problem: Clinical Measurements: Goal: Ability to maintain clinical measurements within normal limits will improve Outcome: Progressing Goal: Will remain free from infection Outcome: Progressing Goal: Diagnostic test results will improve Outcome: Progressing Goal: Respiratory complications will improve Outcome: Progressing Goal: Cardiovascular complication will be avoided Outcome: Progressing   Problem: Activity: Goal: Risk for activity intolerance will decrease Outcome: Progressing   Problem: Nutrition: Goal: Adequate nutrition will be maintained Outcome: Progressing   Problem: Coping: Goal: Level of anxiety will decrease Outcome: Progressing   Problem: Elimination: Goal: Will not experience complications related to bowel motility Outcome: Progressing Goal: Will not experience complications related to urinary retention Outcome: Progressing   Problem: Pain Managment: Goal: General experience of comfort will improve Outcome: Progressing   Problem: Safety: Goal: Ability to remain free from injury will improve Outcome: Progressing   Problem: Skin Integrity: Goal: Risk for impaired skin integrity will decrease Outcome: Progressing   Problem: Education: Goal: Ability to  describe self-care measures that may prevent or decrease complications (Diabetes Survival Skills Education) will improve Outcome: Progressing Goal: Individualized Educational Video(s) Outcome: Progressing   Problem: Coping: Goal: Ability to adjust to condition or change in health will improve Outcome: Progressing   Problem: Fluid Volume: Goal: Ability to maintain a balanced intake and output will improve Outcome: Progressing   Problem: Health Behavior/Discharge Planning: Goal: Ability to identify and utilize available resources and services will improve Outcome: Progressing Goal: Ability to manage health-related needs will improve Outcome: Progressing   Problem: Metabolic: Goal: Ability to maintain appropriate glucose levels will improve Outcome: Progressing   Problem: Nutritional: Goal: Maintenance of adequate nutrition will improve Outcome: Progressing Goal: Progress toward achieving an optimal weight will improve Outcome: Progressing   Problem: Skin Integrity: Goal: Risk for impaired skin integrity will decrease Outcome: Progressing   Problem: Tissue Perfusion: Goal: Adequacy of tissue perfusion will improve Outcome: Progressing

## 2022-05-18 DIAGNOSIS — I484 Atypical atrial flutter: Secondary | ICD-10-CM | POA: Diagnosis not present

## 2022-05-18 DIAGNOSIS — M6282 Rhabdomyolysis: Secondary | ICD-10-CM | POA: Diagnosis not present

## 2022-05-18 DIAGNOSIS — N10 Acute tubulo-interstitial nephritis: Secondary | ICD-10-CM | POA: Diagnosis not present

## 2022-05-18 DIAGNOSIS — R338 Other retention of urine: Secondary | ICD-10-CM | POA: Diagnosis not present

## 2022-05-18 DIAGNOSIS — A419 Sepsis, unspecified organism: Secondary | ICD-10-CM | POA: Diagnosis not present

## 2022-05-18 LAB — CBC
HCT: 33.7 % — ABNORMAL LOW (ref 39.0–52.0)
Hemoglobin: 11.9 g/dL — ABNORMAL LOW (ref 13.0–17.0)
MCH: 28.5 pg (ref 26.0–34.0)
MCHC: 35.3 g/dL (ref 30.0–36.0)
MCV: 80.6 fL (ref 80.0–100.0)
Platelets: 113 10*3/uL — ABNORMAL LOW (ref 150–400)
RBC: 4.18 MIL/uL — ABNORMAL LOW (ref 4.22–5.81)
RDW: 13.1 % (ref 11.5–15.5)
WBC: 12.3 10*3/uL — ABNORMAL HIGH (ref 4.0–10.5)
nRBC: 0 % (ref 0.0–0.2)

## 2022-05-18 LAB — BRAIN NATRIURETIC PEPTIDE: B Natriuretic Peptide: 239.6 pg/mL — ABNORMAL HIGH (ref 0.0–100.0)

## 2022-05-18 LAB — COMPREHENSIVE METABOLIC PANEL
ALT: 58 U/L — ABNORMAL HIGH (ref 0–44)
AST: 130 U/L — ABNORMAL HIGH (ref 15–41)
Albumin: 2.5 g/dL — ABNORMAL LOW (ref 3.5–5.0)
Alkaline Phosphatase: 64 U/L (ref 38–126)
Anion gap: 8 (ref 5–15)
BUN: 25 mg/dL — ABNORMAL HIGH (ref 8–23)
CO2: 16 mmol/L — ABNORMAL LOW (ref 22–32)
Calcium: 7.6 mg/dL — ABNORMAL LOW (ref 8.9–10.3)
Chloride: 109 mmol/L (ref 98–111)
Creatinine, Ser: 0.9 mg/dL (ref 0.61–1.24)
GFR, Estimated: >60 mL/min (ref >60)
Glucose, Bld: 123 mg/dL — ABNORMAL HIGH (ref 70–99)
Potassium: 3 mmol/L — ABNORMAL LOW (ref 3.5–5.1)
Sodium: 133 mmol/L — ABNORMAL LOW (ref 135–145)
Total Bilirubin: 1 mg/dL (ref 0.3–1.2)
Total Protein: 4.9 g/dL — ABNORMAL LOW (ref 6.5–8.1)

## 2022-05-18 LAB — GLUCOSE, CAPILLARY
Glucose-Capillary: 144 mg/dL — ABNORMAL HIGH (ref 70–99)
Glucose-Capillary: 142 mg/dL — ABNORMAL HIGH (ref 70–99)
Glucose-Capillary: 129 mg/dL — ABNORMAL HIGH (ref 70–99)
Glucose-Capillary: 180 mg/dL — ABNORMAL HIGH (ref 70–99)

## 2022-05-18 LAB — CK: Total CK: 4035 U/L — ABNORMAL HIGH (ref 49–397)

## 2022-05-18 LAB — URINE CULTURE: Culture: >=100000 — AB

## 2022-05-18 LAB — PROCALCITONIN: Procalcitonin: 14.72 ng/mL

## 2022-05-18 MED ORDER — CEFAZOLIN SODIUM-DEXTROSE 1-4 GM/50ML-% IV SOLN
1.0000 g | Freq: Three times a day (TID) | INTRAVENOUS | Status: DC
  Administered 2022-05-18 – 2022-05-20 (×6): 1 g via INTRAVENOUS
  Filled 2022-05-18 (×7): qty 50

## 2022-05-18 MED ORDER — POTASSIUM CHLORIDE CRYS ER 20 MEQ PO TBCR
40.0000 meq | EXTENDED_RELEASE_TABLET | ORAL | Status: AC
  Administered 2022-05-18 (×2): 40 meq via ORAL
  Filled 2022-05-18 (×2): qty 2

## 2022-05-18 MED ORDER — SODIUM CHLORIDE 0.9 % IV BOLUS
500.0000 mL | Freq: Once | INTRAVENOUS | Status: AC
  Administered 2022-05-18: 500 mL via INTRAVENOUS

## 2022-05-18 MED ORDER — SODIUM BICARBONATE 650 MG PO TABS
650.0000 mg | ORAL_TABLET | Freq: Two times a day (BID) | ORAL | Status: DC
  Administered 2022-05-18 – 2022-05-23 (×11): 650 mg via ORAL
  Filled 2022-05-18 (×11): qty 1

## 2022-05-18 MED ORDER — MIDODRINE HCL 5 MG PO TABS
10.0000 mg | ORAL_TABLET | Freq: Three times a day (TID) | ORAL | Status: DC
  Administered 2022-05-18 – 2022-05-21 (×9): 10 mg via ORAL
  Filled 2022-05-18 (×9): qty 2

## 2022-05-18 MED ORDER — DILTIAZEM HCL-DEXTROSE 125-5 MG/125ML-% IV SOLN (PREMIX)
5.0000 mg/h | INTRAVENOUS | Status: AC
  Administered 2022-05-18: 10 mg/h via INTRAVENOUS
  Administered 2022-05-19: 15 mg/h via INTRAVENOUS
  Administered 2022-05-19: 10 mg/h via INTRAVENOUS
  Administered 2022-05-20: 15 mg/h via INTRAVENOUS
  Filled 2022-05-18 (×5): qty 125

## 2022-05-18 NOTE — Progress Notes (Signed)
   05/18/22 0155  Assess: MEWS Score  Temp 98.4 F (36.9 C)  BP (!) 89/70  MAP (mmHg) 78  Pulse Rate 81  Resp 20  SpO2 93 %  Assess: MEWS Score  MEWS Temp 0  MEWS Systolic 1  MEWS Pulse 0  MEWS RR 0  MEWS LOC 0  MEWS Score 1  MEWS Score Color Green  Assess: SIRS CRITERIA  SIRS Temperature  0  SIRS Pulse 0  SIRS Respirations  0  SIRS WBC 1  SIRS Score Sum  1

## 2022-05-18 NOTE — Assessment & Plan Note (Signed)
Likely due to volume shifts and episodes of hypotension.  Foley catheter placed as of 11/19.

## 2022-05-18 NOTE — Progress Notes (Addendum)
[facility] Progress Note  Patient: Pedro Mccoy    DTO:[id number]  DOA: 05/23/2022    Date of Service: the patient was seen and examined on 05/18/2022  Brief hospital course: 71 year old male with past medical history of metastatic pancreatic cancer on chemotherapy, hypertension, diabetes mellitus, schizophrenia and tardive dyskinesia presented to the emergency room on 11/17 with complaints of weakness, fall x2 earlier that day and abdominal pain.  Work-up with septic shock secondary to pyelonephritis, acute versus subacute fracture of L3 vertebral body and rhabdomyolysis with CPK of 22,000.  Patient admitted to the hospitalist service and started on IV fluids and antibiotics.  By 11/19, urine cultures positive for MSSA.  Following admission, on the evening of 11/17, patient went to rapid atrial flutter.  Attempted treatment with metoprolol and IV fluids.  11/19, tachycardia persisting and patient started on Cardizem drip.  Assessment and Plan: Assessment and Plan: * Rhabdomyolysis Secondary to fall and some likely infectious component?  Responding well to IV fluids and down to 4000  Septic shock (HCC) Meets criteria for septic shock on admission given urinary source with pyelonephritis seen on CT, lactic acid level of 4.3, white blood cell count of 16.8, tachypnea and tachycardia.  Patient started on aggressive fluid resuscitation with IV fluids and antibiotics.  Blood and urine cultures are pending.  Noted significantly elevated procalcitonin of 22.9.  Following fluid resuscitation, lactic acid level has started to trend downward, but then trended back upward, and down to 1.5 x 11/18 afternoon.    Acute pyelonephritis As above.  Urine cultures positive for MSSA.  Infectious disease to see.  Atrial flutter ([place]) On metoprolol plus as needed Lopressor.  Remains difficult to control.  Have started Cardizem drip.  Acute urinary retention Likely due to volume shifts and episodes of  hypotension.  Foley catheter placed as of 11/19.  Lumbar compression fracture, closed, initial encounter ([place]) Once rhabdomyolysis and sepsis stabilized, will ask IR to evaluate for kyphoplasty.  Possibly this could be related to metastases as well  Pancreatic cancer ([place]) Metastatic mets to the liver.  Oncology following with recent chemotherapy  HTN (hypertension) Blood pressures initially elevated, but since then dropped down.  Schizophrenia ([place]) Continue home medications  Diabetes mellitus without complication (HCC) Sliding scale  Abdominal pain No evidence of pancreatitis or acute pancreatic obstruction.  Suspect may be secondary to referred pain from pyelonephritis versus compression fracture  Abnormal LFTs Mild, likely secondary to his liver metastases and also some shock liver from hypotension  Fall at home, initial encounter Secondary to weakness, rhabdo and sepsis.  Physical therapy see as patient improves       Body mass index is 23.88 kg/m.        Consultants: None  Procedures: None  Antimicrobials: IV Rocephin 11/17-present  Code Status: Full code   Subjective: Patient resting comfortably  Objective: Noted episodes of tachycardia as well as hypotension earlier Vitals:   05/18/22 1256 05/18/22 1406  BP:  106/72  Pulse: (!) 122 (!) 125  Resp:  (!) 24  Temp:  100.2 F (37.9 C)  SpO2:  95%    Intake/Output Summary (Last 24 hours) at 05/18/2022 1558 Last data filed at 05/18/2022 0743 Gross per 24 hour  Intake 3251.28 ml  Output 2350 ml  Net 901.28 ml    Filed Weights   05/03/2022 1042  Weight: 82.1 kg   Body mass index is 23.88 kg/m.  Exam:  General: Somnolent, no acute distress HEENT: Normocephalic, atraumatic, mucous membranes are dry  Cardiovascular: Regular rate and rhythm, borderline tachycardia Respiratory: Clear to auscultation bilaterally Abdomen: Soft, nontender, nondistended, hypoactive bowel sounds Musculoskeletal:  No clubbing or cyanosis or edema Skin: No skin breaks, tears or lesions Psychiatry: Currently resting comfortably, no evidence of acute psychoses Neurology: No immediate focal deficits  Data Reviewed: Positive blood cultures.  CK down to 4000.  Potassium 3.0.  Creatinine 0.83.  Improving transaminases, white blood cell count down to 12.  procalcitonin down to 14  Disposition:  Status is: Inpatient Remains inpatient appropriate because:  -Treatment of sepsis -Treatment of rhabdo -Control of atrial flutter    Anticipated discharge date: 11/22  Family Communication: We will call family DVT Prophylaxis: enoxaparin (LOVENOX) injection 40 mg Start: 05/02/2022 1600   Author: Annita Brod ,MD 05/18/2022 3:58 PM  To reach On-call, see care teams to locate the attending and reach out via [email or web address]. Between 7PM-7AM, please contact night-coverage If you still have difficulty reaching the attending provider, please page the [facility] (Director on Call) for [facility] on amion for assistance.

## 2022-05-18 NOTE — Progress Notes (Addendum)
       CROSS COVER NOTE  NAME: Pedro Mccoy MRN: [id number] DOB : March 08, 1951    Date of Service   0618  HPI/Events of Note   Nurse reports greater than 900 ml volume on bladder scan  Assessment and  Interventions   Assessment: Prostamegaly and distended urinary bladder on CT abdomen 11/17 therefore likely partial outflow obstruction. Evidence of possible pyelo on CT as well In and out cath now Bladder scan every 8 with in and out cath for volumes greater than 400 If more than 3 in and out caths needed place foley May need urology consult if unable to pass catheter May also consider MRI of spine with multilevel impingement noted on ct of lumbar spine       Kathlene Cote NP [facility]  UPDATE In and out cath yield volume 2350 - will need foley with next in and out

## 2022-05-18 NOTE — Plan of Care (Signed)
  Problem: Clinical Measurements: Goal: Cardiovascular complication will be avoided Outcome: Not Progressing  Heart rate elevated during overnight 2 doses of metoprolol given and one NS bolus for BP 87/65.  Problem: Elimination: Goal: Will not experience complications related to urinary retention Outcome: Not Progressing  Pt had not voided this shift felt like he might have to urinate. Pt did not void. Bladder scan completed showed 931 . NP contacted and In and out cath completed and 1000 removed. Clamped and then removed 850 then clamped then 500. NP aware. Heart rate improved after bladder emptied and NP aware.   Problem: Education: Goal: Ability to describe self-care measures that may prevent or decrease complications (Diabetes Survival Skills Education) will improve Outcome: Progressing   Problem: Education: Goal: Individualized Educational Video(s) Outcome: Progressing   Problem: Coping: Goal: Ability to adjust to condition or change in health will improve Outcome: Progressing   Problem: Fluid Volume: Goal: Ability to maintain a balanced intake and output will improve Outcome: Progressing   Problem: Health Behavior/Discharge Planning: Goal: Ability to identify and utilize available resources and services will improve Outcome: Progressing   Problem: Health Behavior/Discharge Planning: Goal: Ability to manage health-related needs will improve Outcome: Progressing   Problem: Metabolic: Goal: Ability to maintain appropriate glucose levels will improve Outcome: Progressing   Problem: Nutritional: Goal: Maintenance of adequate nutrition will improve Outcome: Progressing   Problem: Nutritional: Goal: Progress toward achieving an optimal weight will improve Outcome: Progressing   Problem: Skin Integrity: Goal: Risk for impaired skin integrity will decrease Outcome: Progressing   Problem: Tissue Perfusion: Goal: Adequacy of tissue perfusion will improve Outcome:  Progressing

## 2022-05-18 NOTE — Progress Notes (Signed)
   05/18/22 0017 05/18/22 0019 05/18/22 0020  Assess: MEWS Score  ECG Heart Rate (!) 148 (!) 153 (!) 128  Assess: MEWS Score  MEWS Temp 0 0 0  MEWS Systolic 0 0 0  MEWS Pulse '3 3 2  '$ MEWS RR '1 1 1  '$ MEWS LOC 0 0 0  MEWS Score '4 4 3  '$ MEWS Score Color Red Red Yellow  Assess: if the MEWS score is Yellow or Red  Were vital signs taken at a resting state? Yes  --   --   Focused Assessment No change from prior assessment  --   --   Does the patient meet 2 or more of the SIRS criteria? Yes  --   --   Does the patient have a confirmed or suspected source of infection? Yes  --   --   Provider and Rapid Response Notified? No  --   --   MEWS guidelines implemented *See Row Information* No, vital signs rechecked (heart rate not sustaining)  --   --   Treat  MEWS Interventions Administered prn meds/treatments  --   --   Notify: Charge Nurse/RN  Name of Charge Nurse/RN Notified Engineer, manufacturing systems  --   --   Date Charge Nurse/RN Notified 05/18/22  --   --   Time Charge Nurse/RN Notified 0151  --   --   Assess: SIRS CRITERIA  SIRS Temperature  0 0 0  SIRS Pulse '1 1 1  '$ SIRS Respirations  '1 1 1  '$ SIRS WBC '1 1 1  '$ SIRS Score Sum  '3 3 3    '$ 05/18/22 0021 05/18/22 0022  Assess: MEWS Score  ECG Heart Rate (!) 142 (!) 120  Assess: MEWS Score  MEWS Temp 0 0  MEWS Systolic 0 0  MEWS Pulse 3 2  MEWS RR 1 1  MEWS LOC 0 0  MEWS Score 4 3  MEWS Score Color Red Yellow  Assess: if the MEWS score is Yellow or Red  Were vital signs taken at a resting state?  --   --   Focused Assessment  --   --   Does the patient meet 2 or more of the SIRS criteria?  --   --   Does the patient have a confirmed or suspected source of infection?  --   --   Provider and Rapid Response Notified?  --   --   MEWS guidelines implemented *See Row Information*  --   --   Treat  MEWS Interventions  --   --   Notify: Charge Nurse/RN  Name of Charge Nurse/RN Notified  --   --   Date Charge Nurse/RN Notified  --   --   Time Charge  Nurse/RN Notified  --   --   Assess: SIRS CRITERIA  SIRS Temperature  0 0  SIRS Pulse 1 1  SIRS Respirations  1 1  SIRS WBC 1 1  SIRS Score Sum  3 3

## 2022-05-19 DIAGNOSIS — I484 Atypical atrial flutter: Secondary | ICD-10-CM | POA: Diagnosis not present

## 2022-05-19 DIAGNOSIS — N10 Acute tubulo-interstitial nephritis: Secondary | ICD-10-CM | POA: Diagnosis not present

## 2022-05-19 DIAGNOSIS — E119 Type 2 diabetes mellitus without complications: Secondary | ICD-10-CM | POA: Diagnosis not present

## 2022-05-19 DIAGNOSIS — A419 Sepsis, unspecified organism: Secondary | ICD-10-CM | POA: Diagnosis not present

## 2022-05-19 DIAGNOSIS — M6282 Rhabdomyolysis: Secondary | ICD-10-CM | POA: Diagnosis not present

## 2022-05-19 DIAGNOSIS — Z515 Encounter for palliative care: Secondary | ICD-10-CM

## 2022-05-19 LAB — PROCALCITONIN: Procalcitonin: 6.5 ng/mL

## 2022-05-19 LAB — COMPREHENSIVE METABOLIC PANEL
ALT: 50 U/L — ABNORMAL HIGH (ref 0–44)
AST: 77 U/L — ABNORMAL HIGH (ref 15–41)
Albumin: 2.3 g/dL — ABNORMAL LOW (ref 3.5–5.0)
Alkaline Phosphatase: 87 U/L (ref 38–126)
Anion gap: 9 (ref 5–15)
BUN: 22 mg/dL (ref 8–23)
CO2: 14 mmol/L — ABNORMAL LOW (ref 22–32)
Calcium: 7.9 mg/dL — ABNORMAL LOW (ref 8.9–10.3)
Chloride: 112 mmol/L — ABNORMAL HIGH (ref 98–111)
Creatinine, Ser: 0.85 mg/dL (ref 0.61–1.24)
GFR, Estimated: >60 mL/min (ref >60)
Glucose, Bld: 174 mg/dL — ABNORMAL HIGH (ref 70–99)
Potassium: 3.8 mmol/L (ref 3.5–5.1)
Sodium: 135 mmol/L (ref 135–145)
Total Bilirubin: 0.8 mg/dL (ref 0.3–1.2)
Total Protein: 5 g/dL — ABNORMAL LOW (ref 6.5–8.1)

## 2022-05-19 LAB — CBC
HCT: 33.5 % — ABNORMAL LOW (ref 39.0–52.0)
Hemoglobin: 11.5 g/dL — ABNORMAL LOW (ref 13.0–17.0)
MCH: 28.2 pg (ref 26.0–34.0)
MCHC: 34.3 g/dL (ref 30.0–36.0)
MCV: 82.1 fL (ref 80.0–100.0)
Platelets: 115 10*3/uL — ABNORMAL LOW (ref 150–400)
RBC: 4.08 MIL/uL — ABNORMAL LOW (ref 4.22–5.81)
RDW: 13.7 % (ref 11.5–15.5)
WBC: 14.7 10*3/uL — ABNORMAL HIGH (ref 4.0–10.5)
nRBC: 0 % (ref 0.0–0.2)

## 2022-05-19 LAB — GLUCOSE, CAPILLARY
Glucose-Capillary: 172 mg/dL — ABNORMAL HIGH (ref 70–99)
Glucose-Capillary: 160 mg/dL — ABNORMAL HIGH (ref 70–99)
Glucose-Capillary: 204 mg/dL — ABNORMAL HIGH (ref 70–99)
Glucose-Capillary: 175 mg/dL — ABNORMAL HIGH (ref 70–99)

## 2022-05-19 LAB — CK: Total CK: 1835 U/L — ABNORMAL HIGH (ref 49–397)

## 2022-05-19 MED ORDER — METOPROLOL SUCCINATE ER 100 MG PO TB24: 100.0000 mg | ORAL_TABLET | Freq: Every day | ORAL | Status: DC

## 2022-05-19 MED ORDER — METOPROLOL SUCCINATE ER 100 MG PO TB24
100.0000 mg | ORAL_TABLET | Freq: Every day | ORAL | Status: DC
  Administered 2022-05-20 – 2022-05-27 (×7): 100 mg via ORAL
  Filled 2022-05-19 (×7): qty 1

## 2022-05-19 MED ORDER — METOPROLOL SUCCINATE ER 100 MG PO TB24: 200.0000 mg | ORAL_TABLET | Freq: Every day | ORAL | Status: DC

## 2022-05-19 MED ORDER — METOPROLOL SUCCINATE ER 50 MG PO TB24
50.0000 mg | ORAL_TABLET | ORAL | Status: AC
  Administered 2022-05-19: 50 mg via ORAL
  Filled 2022-05-19: qty 1

## 2022-05-19 NOTE — Consult Note (Signed)
Consultation Note Date: 05/19/2022 at 56  Patient Name: Pedro Mccoy  DOB: 10/28/1950  MRN: [id number]  Age / Sex: 71 y.o., male  PCP: Romualdo Bolk, FNP Referring Physician: Annita Brod, MD  Reason for Consultation: Establishing goals of care  HPI/Patient Profile: 71 y.o. male  with past medical history of prostate cancer with known metastasis to liver, HTN, type 2 diabetes, schizophrenia (visual hallucinations), tardive dyskinesia, and right total hip replacement admitted on 05/07/2022 with weakness and falls (X2) at home.  Patient is being treated for septic shock secondary to pyelonephritis as well as L3 vertebral fracture, rhabdomyolysis, and urine cultures positive for MSSA.  On 11/19, patient became tachycardic, failed to respond to metoprolol and IVF, and was placed on Cardizem gtt.  PMT was consulted to discuss goals of care..   Clinical Assessment and Goals of Care: I have reviewed medical records including EPIC notes, labs and imaging, assessed the patient and then met with patient at bedside to discuss diagnosis prognosis, GOC, EOL wishes, disposition and options.  Patient is unable to participate in goals of care conversations.  He is not decisional as he believes he just completed a trip to [place].  When asked what brought him to the hospital, he says that the "people who took me to [place] were really wonderful".  After meeting with the patient, I attempted to speak with patient's next of kin, and Bonnita Nasuti is listed on the chart.  I spoke with Bonnita Nasuti over the phone and she was with patient's sister, [name], who had just arrived from [place].  I spoke with [name] in regards to goals of care.  [name] shares that she is next of kin and surrogate decision maker for patient.  She is patient's only sibling.  She has been heavily involved in his oncology treatments for pancreatic  cancer at [facility].  I introduced Palliative Medicine as specialized medical care for people living with serious illness. It focuses on providing relief from the symptoms and stress of a serious illness. The goal is to improve quality of life for both the patient and the family.  We discussed patient's current illness and what it means in the larger context of patient's on-going co-morbidities.  Brief medical update given.  [name] is requesting attending speak with her to get full medical update.  Attending Dr. Maryland Pink made aware via secure chat.  Education provided on rhabdomyolysis, pyelonephritis, atrial flutter, and sepsis. Discussed metastatic pancreatic disease superimposed on these acute issues. Functional, nutritional, and cognitive status as indicators of prognosis reviewed.   I attempted to elicit values and goals of care important to the patient.  [name] shares patient has not created advanced care planning documentation but upon diagnosis of cancer stated he wanted to make a living will.  [name] shares she does not know patient's wishes.  She says unless there is an urgent need to discuss them she does not know what decisions to make for him at this time. [place] discussions will be ongoing.   We discussed  that placing boundaries on care before emergency or urgent medical situations occur would best help align medical treatments with patient's values and beliefs.  Family is facing treatment option decisions, advanced directive, and anticipatory care needs.    Discussed with [name] the importance of continued conversation with family and the medical providers regarding overall plan of care and treatment options, ensuring decisions are within the context of the patient's values and GOCs.    [name] is attempting to get into patient's apartment since he has not been there for several days.  She is concerned about his cats.  She will not be visiting the hospital today and is unclear when she will be  at the hospital.  However, she is readily available at cell phone [phone] and would appreciate any updates from medical team.  Questions and concerns were addressed. The family was encouraged to call with questions or concerns.   PMT will continue to follow the patient throughout his hospitalization.   Primary Decision Maker NEXT OF KIN  Physical Exam Constitutional:      Appearance: Normal appearance.  HENT:     Head: Normocephalic.  Eyes:     Pupils: Pupils are equal, round, and reactive to light.  Cardiovascular:     Rate and Rhythm: Tachycardia present.  Pulmonary:     Effort: Pulmonary effort is normal.  Abdominal:     Palpations: Abdomen is soft.  Musculoskeletal:     Comments: Generalized weakness, MAETC  Skin:    General: Skin is warm and dry.     Findings: Bruising present.     Comments: Brusing of left cheek  Neurological:     Mental Status: He is alert.     Comments: Oriented to self     Palliative Assessment/Data: 50%     Thank you for this consult. Palliative medicine will continue to follow and assist holistically.   Time Total: 75 minutes Greater than 50%  of this time was spent counseling and coordinating care related to the above assessment and plan.  Signed by: Jordan Hawks, DNP, FNP-BC Palliative Medicine    Please contact Palliative Medicine Team phone at [phone] for questions and concerns.  For individual provider: See Shea Evans

## 2022-05-19 NOTE — Progress Notes (Signed)
[facility] Progress Note  Patient: Pedro Mccoy    KYH:[id number]  DOA: 05/22/2022    Date of Service: the patient was seen and examined on 05/19/2022  Brief hospital course: 71 year old male with past medical history of metastatic pancreatic cancer on chemotherapy, hypertension, diabetes mellitus, schizophrenia and tardive dyskinesia presented to the emergency room on 11/17 with complaints of weakness, fall x2 earlier that day and abdominal pain.  Work-up with septic shock secondary to pyelonephritis, acute versus subacute fracture of L3 vertebral body and rhabdomyolysis with CPK of 22,000.  Patient admitted to the hospitalist service and started on IV fluids and antibiotics.  By 11/19, urine cultures positive for MSSA.  Following admission, on the evening of 11/17, patient went to rapid atrial flutter.  Attempted treatment with metoprolol and IV fluids.  11/19, tachycardia persisting and patient started on Cardizem drip.  Heart rate starting to improve.  Assessment and Plan: Assessment and Plan: * Rhabdomyolysis Secondary to fall and some likely infectious component?  Responding well to IV fluids and down to 1800  Septic shock (HCC) Meets criteria for septic shock on admission given urinary source with pyelonephritis seen on CT, lactic acid level of 4.3, white blood cell count of 16.8, tachypnea and tachycardia.  Patient started on aggressive fluid resuscitation with IV fluids and antibiotics.  Blood and urine cultures are pending.  Noted significantly elevated procalcitonin of 22.9.  Following fluid resuscitation, lactic acid level has started to trend downward, but then trended back upward, and down to 1.5 x 11/18 afternoon.  While procalcitonin continues to improve, noted white blood cell count with mild increase today.  Continue to follow.    Acute pyelonephritis As above.  Urine cultures positive for MSSA.  Infectious disease to see.  Atrial flutter ([place]) On metoprolol plus as  needed Lopressor.  Remains difficult to control.  Responded to Cardizem drip.  We will start increasing oral medication so we can wean off.  Acute urinary retention Likely due to volume shifts and episodes of hypotension.  Foley catheter placed as of 11/19.  Lumbar compression fracture, closed, initial encounter ([place]) Once rhabdomyolysis and sepsis stabilized, will ask IR to evaluate for kyphoplasty.  Possibly this could be related to metastases as well  Pancreatic cancer ([place]) Metastatic mets to the liver.  Oncology following with recent chemotherapy  HTN (hypertension) Blood pressures initially elevated, but since then dropped down.  Schizophrenia ([place]) Continue home medications  Diabetes mellitus without complication (HCC) Sliding scale  Abdominal pain No evidence of pancreatitis or acute pancreatic obstruction.  Suspect may be secondary to referred pain from pyelonephritis versus compression fracture  Abnormal LFTs Mild, likely secondary to his liver metastases and also some shock liver from hypotension  Fall at home, initial encounter Secondary to weakness, rhabdo and sepsis.  Physical therapy see as patient improves       Body mass index is 23.88 kg/m.        Consultants: None  Procedures: None  Antimicrobials: IV Rocephin 11/17-11/19 IV Ancef 11/19-present  Code Status: Full code   Subjective: Patient feels very tired, denies shortness of breath  Objective: Noted episodes of tachycardia as well as hypotension earlier Vitals:   05/19/22 0722 05/19/22 1123  BP: 116/68 125/86  Pulse: 84 98  Resp: 18 17  Temp: 98.1 F (36.7 C) 97.9 F (36.6 C)  SpO2: 98% 94%    Intake/Output Summary (Last 24 hours) at 05/19/2022 1602 Last data filed at 05/19/2022 1400 Gross per 24 hour  Intake 1412.06  ml  Output 2250 ml  Net -837.94 ml    Filed Weights   05/05/2022 1042  Weight: 82.1 kg   Body mass index is 23.88 kg/m.  Exam:  General: Fatigue, no  acute distress HEENT: Normocephalic, atraumatic, mucous membranes are dry Cardiovascular: Regular rate and rhythm, heart rate better controlled. Respiratory: Clear to auscultation bilaterally Abdomen: Soft, nontender, nondistended, hypoactive bowel sounds Musculoskeletal: No clubbing or cyanosis or edema Skin: No skin breaks, tears or lesions Psychiatry: Currently resting comfortably, no evidence of acute psychoses Neurology: No immediate focal deficits  Data Reviewed: Positive blood cultures.  CK down to 1800.  White blood cell count increasing although procalcitonin improved.  Disposition:  Status is: Inpatient Remains inpatient appropriate because:  -Treatment of sepsis, follow-up on staph infection -Treatment of rhabdo -Transition rate control medications over to p.o.    Anticipated discharge date: 11/22  Family Communication: Sister from out of state has arrived.  Number not on chart, but now we have, we will call her. DVT Prophylaxis: enoxaparin (LOVENOX) injection 40 mg Start: 05/11/2022 1600   Author: Annita Brod ,MD 05/19/2022 4:02 PM  To reach On-call, see care teams to locate the attending and reach out via [email or web address]. Between 7PM-7AM, please contact night-coverage If you still have difficulty reaching the attending provider, please page the [place] (Director on Call) for [facility] on amion for assistance.

## 2022-05-19 NOTE — Assessment & Plan Note (Deleted)
Urine cultures growing out MSSA.  Discussing with infectious disease.

## 2022-05-19 NOTE — TOC CM/SW Note (Signed)
  Transition of Care [facility]) Screening Note   Patient Details  Name: Pedro Mccoy Date of Birth: [birth year]   Transition of Care [facility]) CM/SW Contact:    Candie Chroman, LCSW Phone Number: 05/19/2022, 11:16 AM    Transition of Care Department [facility]) has reviewed patient and no TOC needs have been identified at this time. We will continue to monitor patient advancement through interdisciplinary progression rounds. If new patient transition needs arise, please place a TOC consult.

## 2022-05-19 NOTE — Care Management Important Message (Signed)
Important Message  Patient Details  Name: Pedro Mccoy MRN: [id number] Date of Birth: 05-14-1951   Medicare Important Message Given:  Yes     [name] 05/19/2022, 11:31 AM

## 2022-05-20 DIAGNOSIS — A419 Sepsis, unspecified organism: Secondary | ICD-10-CM | POA: Diagnosis not present

## 2022-05-20 DIAGNOSIS — N10 Acute tubulo-interstitial nephritis: Secondary | ICD-10-CM | POA: Diagnosis not present

## 2022-05-20 DIAGNOSIS — M6282 Rhabdomyolysis: Secondary | ICD-10-CM | POA: Diagnosis not present

## 2022-05-20 DIAGNOSIS — I484 Atypical atrial flutter: Secondary | ICD-10-CM | POA: Diagnosis not present

## 2022-05-20 LAB — CBC
HCT: 34.4 % — ABNORMAL LOW (ref 39.0–52.0)
Hemoglobin: 12 g/dL — ABNORMAL LOW (ref 13.0–17.0)
MCH: 28.6 pg (ref 26.0–34.0)
MCHC: 34.9 g/dL (ref 30.0–36.0)
MCV: 82.1 fL (ref 80.0–100.0)
Platelets: 142 10*3/uL — ABNORMAL LOW (ref 150–400)
RBC: 4.19 MIL/uL — ABNORMAL LOW (ref 4.22–5.81)
RDW: 13.7 % (ref 11.5–15.5)
WBC: 15 10*3/uL — ABNORMAL HIGH (ref 4.0–10.5)
nRBC: 0 % (ref 0.0–0.2)

## 2022-05-20 LAB — COMPREHENSIVE METABOLIC PANEL
ALT: 37 U/L (ref 0–44)
AST: 49 U/L — ABNORMAL HIGH (ref 15–41)
Albumin: 2.1 g/dL — ABNORMAL LOW (ref 3.5–5.0)
Alkaline Phosphatase: 139 U/L — ABNORMAL HIGH (ref 38–126)
Anion gap: 9 (ref 5–15)
BUN: 20 mg/dL (ref 8–23)
CO2: 16 mmol/L — ABNORMAL LOW (ref 22–32)
Calcium: 7.7 mg/dL — ABNORMAL LOW (ref 8.9–10.3)
Chloride: 112 mmol/L — ABNORMAL HIGH (ref 98–111)
Creatinine, Ser: 0.89 mg/dL (ref 0.61–1.24)
GFR, Estimated: >60 mL/min (ref >60)
Glucose, Bld: 176 mg/dL — ABNORMAL HIGH (ref 70–99)
Potassium: 3.3 mmol/L — ABNORMAL LOW (ref 3.5–5.1)
Sodium: 137 mmol/L (ref 135–145)
Total Bilirubin: 1.2 mg/dL (ref 0.3–1.2)
Total Protein: 5 g/dL — ABNORMAL LOW (ref 6.5–8.1)

## 2022-05-20 LAB — GLUCOSE, CAPILLARY
Glucose-Capillary: 183 mg/dL — ABNORMAL HIGH (ref 70–99)
Glucose-Capillary: 192 mg/dL — ABNORMAL HIGH (ref 70–99)
Glucose-Capillary: 195 mg/dL — ABNORMAL HIGH (ref 70–99)
Glucose-Capillary: 167 mg/dL — ABNORMAL HIGH (ref 70–99)

## 2022-05-20 LAB — PROCALCITONIN: Procalcitonin: 3.5 ng/mL

## 2022-05-20 LAB — CK: Total CK: 847 U/L — ABNORMAL HIGH (ref 49–397)

## 2022-05-20 MED ORDER — FUROSEMIDE 10 MG/ML IJ SOLN
20.0000 mg | Freq: Two times a day (BID) | INTRAMUSCULAR | Status: DC
  Administered 2022-05-20 – 2022-05-26 (×11): 20 mg via INTRAVENOUS
  Filled 2022-05-20 (×11): qty 2

## 2022-05-20 MED ORDER — DILTIAZEM HCL ER COATED BEADS 180 MG PO CP24
360.0000 mg | ORAL_CAPSULE | Freq: Every day | ORAL | Status: DC
  Administered 2022-05-20 – 2022-05-28 (×9): 360 mg via ORAL
  Filled 2022-05-20 (×9): qty 2

## 2022-05-20 MED ORDER — CHLORHEXIDINE GLUCONATE CLOTH 2 % EX PADS
6.0000 | MEDICATED_PAD | Freq: Every day | CUTANEOUS | Status: DC
  Administered 2022-05-20 – 2022-05-30 (×11): 6 via TOPICAL

## 2022-05-20 MED ORDER — CEFAZOLIN SODIUM-DEXTROSE 2-4 GM/100ML-% IV SOLN
2.0000 g | Freq: Three times a day (TID) | INTRAVENOUS | Status: DC
  Administered 2022-05-20 – 2022-05-27 (×20): 2 g via INTRAVENOUS
  Filled 2022-05-20 (×22): qty 100

## 2022-05-20 MED ORDER — ORAL CARE MOUTH RINSE: 15.0000 mL | OROMUCOSAL | Status: DC | PRN

## 2022-05-20 NOTE — Consult Note (Signed)
[facility]                                                                                        Patient Identification: Patient Name: Pedro Mccoy MRN: [id number] [name] Date: 05/02/2022 10:38 AM Today's Date: 05/20/2022 Reason for consult: antibiotics recommendations  Requesting provider: Dr Maryland Pink  Unable to do remote tele video consult due to caregility not working.  Recommendations are based on chart review.   Principal Problem:   Rhabdomyolysis Active Problems:   Schizophrenia ([facility])   Fall at home, initial encounter   Pancreatic cancer ([place])   HTN (hypertension)   Abdominal pain   Abnormal LFTs   Depression with anxiety   HLD (hyperlipidemia)   Diabetes mellitus without complication ([facility])   UTI (urinary tract infection)   Septic shock ([facility])   Acute pyelonephritis   Lumbar compression fracture, closed, initial encounter [facility])   Atrial flutter ([facility])   Acute urinary retention   Antibiotics:  Cefepime 11/17, Cefazolin 11/19-c Ceftriaxone 11/17-11/18  Lines/Hardware: I dont see any central lines through EMR, Rt THA  Assessment Recurrent falls/Rhabdomyolysis Urine cx with MSSA/Pyuria/Possible Pyelonephritis  CT abd/pelvis with paraspinal edema + other abnormalities  11/21 staph epidermidis and staph hominis in 1/2 sets - likely a contaminant   Comments: It appears that blood cx were drawn after he got one dose of cefepime on 11/17. It is weird that his blood cx did not grow staph aureus whereas urine cx grew MSSA which is not a common bug for UTI. Would have expected staph aureus to still grow if truly bacteremic. TTE although poor study did not mention vegetations or endocarditis.    Recommendations  Continue cefazolin  Fu repeat blood cx Consider MRI T L spine to r/o discitis/epidural abscess given recurrent falls, MSSA in urine and CT with paraspinal edema D/w Dr  Maryland Pink and ID Earle Gell   Following remotely   Rest of the management as per the primary team. Please call with questions or concerns.  Thank you for the consult  Rosiland Oz, MD Infectious Disease Physician [facility] [address] [place], [place] [facility] Phone: [phone]  Fax: [phone]  __________________________________________________________________________________________________________ HPI and [facility] Course: 71 year old male with PMH as below including metastatic pancreatic cancer on chemotherapy, DM 2, schizophrenia and tardive dyskinesia who presented to the ED on 11/17 for a generalized weakness and fall.  Patient was found on the floor covered in urine.  Noted to have foul-smelling urine at ED as well as suprapubic abdominal pain  At ED afebrile, WBC 16.8, AST 69, Lactate 4 CPK 22,000 [facility] course complicated by rapid A- flutter requiring Cardizem drip Imaging findings as below   Past Medical History:  Diagnosis Date   Hypertension    Hypertriglyceridemia .   Past Surgical History:  Procedure Laterality Date   NO PAST SURGERIES     TOTAL HIP ARTHROPLASTY Right 05/21/2021   Procedure: ANTERIOR HIP TOTAL ARTHROPLASTY;  Surgeon: Hessie Knows, MD;  Location: [facility] ORS;  Service: Orthopedics;  Laterality: Right;     Scheduled Meds:  Chlorhexidine Gluconate  Cloth  6 each Topical Daily   diltiazem  360 mg Oral Daily   enoxaparin (LOVENOX) injection  40 mg Subcutaneous Q24H   FLUoxetine  40 mg Oral Daily   fluvoxaMINE  100 mg Oral BID   insulin aspart  0-5 Units Subcutaneous QHS   insulin aspart  0-9 Units Subcutaneous TID WC   lamoTRIgine  200 mg Oral BID   lidocaine  1 patch Transdermal Q24H   metoprolol succinate  100 mg Oral Daily   midodrine  10 mg Oral TID WC   omega-3 acid ethyl esters  1 g Oral Daily   risperiDONE  3 mg Oral QHS   sodium bicarbonate  650 mg Oral BID    Continuous Infusions:   ceFAZolin (ANCEF) IV 1 g (05/20/22 1223)   PRN Meds:.hydrALAZINE, ibuprofen, LORazepam, metoprolol tartrate, morphine injection, ondansetron (ZOFRAN) IV, mouth rinse, oxyCODONE  Allergies  Allergen Reactions   Ace Inhibitors Hives and Rash   Hydrochlorothiazide Hives   Pioglitazone Nausea Only   Codeine     Has tolerated morphine in the past   Sitagliptin Nausea And Vomiting   Lithium Diarrhea   Social History   Socioeconomic History   Marital status: Widowed    Spouse name: Not on file   Number of children: Not on file   Years of education: Not on file   Highest education level: Not on file  Occupational History   Not on file  Tobacco Use   Smoking status: Never   Smokeless tobacco: Never  Vaping Use   Vaping Use: Never used  Substance and Sexual Activity   Alcohol use: No   Drug use: No   Sexual activity: Not on file  Other Topics Concern   Not on file  Social History Narrative   Not on file   Social Determinants of Health   Financial Resource Strain: Not on file  Food Insecurity: No Food Insecurity (05/23/2022)   Hunger Vital Sign    Worried About Running Out of Food in the Last Year: Never true    Ran Out of Food in the Last Year: Never true  Transportation Needs: No Transportation Needs (05/10/2022)   PRAPARE - Hydrologist (Medical): No    Lack of Transportation (Non-Medical): No  Physical Activity: Not on file  Stress: Not on file  Social Connections: Not on file  Intimate Partner Violence: Not At Risk (05/18/2022)   Humiliation, Afraid, Rape, and Kick questionnaire    Fear of Current or Ex-Partner: No    Emotionally Abused: No    Physically Abused: No    Sexually Abused: No   Family History  Problem Relation Age of Onset   COPD Mother    Heart attack Father     Vitals BP (!) 141/98 (BP Location: Right Arm)   Pulse 60   Temp 98 F (36.7 C) (Axillary)   Resp 16   Ht '6\' 1"'$  (1.854 m)    Wt 82.1 kg   SpO2 95%   BMI 23.88 kg/m   Pertinent Microbiology Results for orders placed or performed during the [facility] encounter of 05/04/2022  Urine Culture     Status: Abnormal   Collection Time: 05/09/2022 12:57 PM   Specimen: Urine, Clean Catch  Result Value Ref Range Status   Specimen Description URINE, CLEAN CATCH  Final   Special Requests NONE  Final   Culture >=100,000 COLONIES/mL STAPHYLOCOCCUS AUREUS (A)  Final   Report Status 05/18/2022 FINAL  Final  Organism ID, Bacteria STAPHYLOCOCCUS AUREUS (A)  Final      Susceptibility   Staphylococcus aureus - MIC*    CIPROFLOXACIN <=0.5 SENSITIVE Sensitive     GENTAMICIN <=0.5 SENSITIVE Sensitive     NITROFURANTOIN <=16 SENSITIVE Sensitive     OXACILLIN <=0.25 SENSITIVE Sensitive     TETRACYCLINE <=1 SENSITIVE Sensitive     VANCOMYCIN <=0.5 SENSITIVE Sensitive     TRIMETH/SULFA <=10 SENSITIVE Sensitive     CLINDAMYCIN <=0.25 SENSITIVE Sensitive     RIFAMPIN <=0.5 SENSITIVE Sensitive     Inducible Clindamycin NEGATIVE Sensitive     * >=100,000 COLONIES/mL STAPHYLOCOCCUS AUREUS  Culture, blood (single)     Status: None (Preliminary result)   Collection Time: 05/21/2022  3:00 PM   Specimen: BLOOD  Result Value Ref Range Status   Specimen Description BLOOD BLOOD RIGHT ARM  Final   Special Requests   Final    BOTTLES DRAWN AEROBIC AND ANAEROBIC Blood Culture results may not be optimal due to an excessive volume of blood received in culture bottles   Culture   Final    NO GROWTH 4 DAYS Performed at [facility], [address] [place], [place] [address]    Report Status PENDING  Incomplete     Pertinent Lab seen by me:    Latest Ref Rng & Units 05/20/2022    4:43 AM 05/19/2022    6:23 AM 05/18/2022    5:05 AM  CBC  WBC 4.0 - 10.5 K/uL 15.0  14.7  12.3   Hemoglobin 13.0 - 17.0 g/dL 12.0  11.5  11.9   Hematocrit 39.0 - 52.0 % 34.4  33.5  33.7   Platelets 150 - 400 K/uL 142  115  113       Latest Ref Rng &  Units 05/20/2022    4:43 AM 05/19/2022    4:52 AM 05/18/2022    5:05 AM  CMP  Glucose 70 - 99 mg/dL 176  174  123   BUN 8 - 23 mg/dL '20  22  25   '$ Creatinine 0.61 - 1.24 mg/dL 0.89  0.85  0.90   Sodium 135 - 145 mmol/L 137  135  133   Potassium 3.5 - 5.1 mmol/L 3.3  3.8  3.0   Chloride 98 - 111 mmol/L 112  112  109   CO2 22 - 32 mmol/L '16  14  16   '$ Calcium 8.9 - 10.3 mg/dL 7.7  7.9  7.6   Total Protein 6.5 - 8.1 g/dL 5.0  5.0  4.9   Total Bilirubin 0.3 - 1.2 mg/dL 1.2  0.8  1.0   Alkaline Phos 38 - 126 U/L 139  87  64   AST 15 - 41 U/L 49  77  130   ALT 0 - 44 U/L 37  50  58      Pertinent Imagings/Other Imagings Plain films and CT images have been personally visualized and interpreted; radiology reports have been reviewed. Decision making incorporated into the Impression / Recommendations.  DG Lumbar Spine 2-3 Views  Result Date: 05/18/2022 CLINICAL DATA:  Fall EXAM: LUMBAR SPINE - 2-3 VIEW COMPARISON:  CT 05/21/2022 FINDINGS: There are 5 non-rib-bearing lumbar vertebrae. There is a chronic compression fracture of L1 and and acute-subacute compression fracture of L3. There is multilevel degenerative disc disease, worst at L4-L5 and L5-S1 with moderate and severe disc height loss respectively. There is moderate lower lumbar predominant facet arthropathy. Partially visualized right hip arthroplasty. IMPRESSION: Acute-subacute  compression fracture of L3 with up to 50% height loss. Chronic compression deformity of L1 without evidence of progressive height loss. Multilevel degenerative disc disease, moderate at L4-L5 and severe at L5-S1. Moderate lower lumbar predominant facet arthropathy. Electronically Signed   By: Maurine Simmering M.D.   On: 05/21/2022 17:09   CT ABDOMEN PELVIS W CONTRAST  Result Date: 05/07/2022 CLINICAL DATA:  Pancreatic cancer. Blunt abdominal trauma. Patient found down on floor incontinent to urine. Recent diagnosis of sepsis. * Tracking Code: BO * EXAM: CT ABDOMEN AND  PELVIS WITH CONTRAST TECHNIQUE: Multidetector CT imaging of the abdomen and pelvis was performed using the standard protocol following bolus administration of intravenous contrast. RADIATION DOSE REDUCTION: This exam was performed according to the departmental dose-optimization program which includes automated exposure control, adjustment of the mA and/or kV according to patient size and/or use of iterative reconstruction technique. CONTRAST:  170m OMNIPAQUE IOHEXOL 300 MG/ML  SOLN COMPARISON:  Radiographs from 04/30/2022 and CT scan 05/20/2021 FINDINGS: Lower chest: Mild dependent atelectasis or scarring. Hepatobiliary: 4 new hypodense lesions in the right hepatic lobe are identified, probably metastatic lesions. The largest measures 1.9 by 1.6 cm in segment 7 of the liver (image 14, series 2). Gallbladder unremarkable. No biliary dilatation. Pancreas: Substantial enlargement of the hypoenhancing infiltrative pancreatic mass, currently 6.6 by 4.2 cm on image 25 of series 2 and previously about 2.7 by 2.0 cm on 05/20/2021. This mass abuts portions of the splenic artery, the junction of the SMV and the portal vein, and nearly occludes the splenic vein as shown for example on image 49 series 5. Spleen: Trace perisplenic ascites, otherwise unremarkable. Adrenals/Urinary Tract: Both adrenal glands appear normal. Stable small renal cysts warrant no further follow up. Subtle parenchymal hypoenhancement posteriorly in the right kidney upper pole on image 32 series 2, correlate with urine analysis in assessing for possible pyelonephritis. Distended urinary bladder. Stomach/Bowel: Unremarkable Vascular/Lymphatic: Vascular involvement of the enlarging pancreatic mass as noted above. Atherosclerosis is present, including aortoiliac atherosclerotic disease. Reproductive: Prostatomegaly. Other: No supplemental non-categorized findings. Musculoskeletal: Right total hip prosthesis noted. Acute or early subacute fracture of the L3  vertebral body with involvement of the superior endplate and extension to the middle column, with 1-2 mm posterior bony retropulsion. We demonstrate nearly 50% loss of vertebral body height and some mild anterior fragmentation of the vertebral rim along with paraspinal edema/mild paraspinal hematoma. There is also a chronic compression fracture at the L4 vertebral level. Multilevel lumbar spondylosis and degenerative disc disease leading to multilevel impingement. Small umbilical hernia containing adipose tissue. Somewhat prominent piriformis and obturator internus musculature in the pelvis, left greater than right, cannot exclude mild sciatic notch impingement due to the left piriformis hypertrophy. IMPRESSION: 1. Substantial enlargement of the hypoenhancing infiltrative pancreatic mass, currently 6.6 by 4.2 cm, previously about 2.7 by 2.0 cm on 05/20/2021. This mass abuts portions of the splenic artery, the junction of the SMV and portal vein, and nearly occludes the splenic vein. 2. 4 new hypodense lesions in the right hepatic lobe are probably metastatic lesions. 3. Acute or early subacute fracture of the L3 vertebral body with involvement of the superior endplate and extension to the middle column, with nearly 50% loss of vertebral body height and some mild anterior fragmentation of the vertebral rim. 1-2 mm posterior bony retropulsion. Paraspinal edema noted. 4. Subtle parenchymal hypoenhancement posteriorly in the right kidney upper pole, correlate with urine analysis in assessing for possible pyelonephritis. 5. Distended urinary bladder. 6. Prostatomegaly. 7. Somewhat  prominent piriformis and obturator internus musculature in the pelvis, left greater than right, cannot exclude left sciatic notch impingement due to the left piriformis hypertrophy. 8. Multilevel lumbar spondylosis and degenerative disc disease leading to multilevel impingement. 9. Small umbilical hernia containing adipose tissue. 10. Aortic  atherosclerosis. Aortic Atherosclerosis (ICD10-I70.0). Electronically Signed   By: Van Clines M.D.   On: 05/09/2022 16:41   DG Hip Unilat W or Wo Pelvis 2-3 Views Left  Result Date: 05/09/2022 CLINICAL DATA:  Fall.  Evaluate for fracture. EXAM: DG HIP (WITH OR WITHOUT PELVIS) 2-3V LEFT COMPARISON:  Right hip radiographs 05/21/2021, pelvis and right hip radiographs 05/20/2021 FINDINGS: Status post total right hip arthroplasty. There is diffuse decreased bone mineralization. No perihardware lucency is seen to indicate radiographic evidence of hardware failure. Mild superolateral left acetabular degenerative osteophytosis. The bilateral sacroiliac, left femoroacetabular, and pubic symphysis joint spaces are maintained. Small sclerotic focus within the left ilium is unchanged from 05/20/2021, compatible with a benign bone island. No acute fracture is seen.  No dislocation. Moderate to severe right L4-5 disc space narrowing and right lateral endplate osteophytes. 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 No acute fracture is seen. Electronically Signed   By: Yvonne Kendall M.D.   On: 05/25/2022 11:32   DG Chest Port 1 View  Result Date: 05/11/2022 CLINICAL DATA:  Sepsis EXAM: PORTABLE CHEST 1 VIEW COMPARISON:  05/20/2021 FINDINGS: Right IJ approach chest port in place with distal tip at the level of the distal SVC. The heart size and mediastinal contours are within normal limits. Aortic atherosclerosis. Both lungs are clear. The visualized skeletal structures are unremarkable. IMPRESSION: No active disease. Electronically Signed   By: Davina Poke D.O.   On: 05/10/2022 11:30   DG Shoulder Left  Result Date: 05/17/2022 CLINICAL DATA:  Fall.  Evaluate for fracture. EXAM: LEFT SHOULDER - 2+ VIEW COMPARISON:  None Available. FINDINGS: Severe acromioclavicular joint space narrowing with mild-to-moderate peripheral degenerative osteophytes. Mild  glenohumeral joint space narrowing. Mild peripheral glenoid degenerative osteophytosis. No acute fracture is seen. No dislocation. The visualized portion of the left lung is unremarkable. IMPRESSION: 1. No acute fracture. 2. Moderate acromioclavicular and mild glenohumeral osteoarthritis. Electronically Signed   By: Yvonne Kendall M.D.   On: 05/17/2022 11:29   CT HEAD WO CONTRAST (5MM)  Result Date: 05/29/2022 CLINICAL DATA:  Head trauma, minor (Age >= 65y); Neck trauma (Age >= 65y) EXAM: CT HEAD WITHOUT CONTRAST CT CERVICAL SPINE WITHOUT CONTRAST TECHNIQUE: Multidetector CT imaging of the head and cervical spine was performed following the standard protocol without intravenous contrast. Multiplanar CT image reconstructions of the cervical spine were also generated. RADIATION DOSE REDUCTION: This exam was performed according to the departmental dose-optimization program which includes automated exposure control, adjustment of the mA and/or kV according to patient size and/or use of iterative reconstruction technique. COMPARISON:  None Available. FINDINGS: CT HEAD FINDINGS Brain: No evidence of acute infarction, hemorrhage, hydrocephalus, extra-axial collection or mass lesion/mass effect. Vascular: No hyperdense vessel identified. Skull: No acute fracture.  Left forehead contusion. Sinuses/Orbits: Paranasal sinus mucosal thickening. Other: No mastoid effusions. CT CERVICAL SPINE FINDINGS Alignment: Straightening.  No substantial sagittal subluxation. Skull base and vertebrae: Vertebral heights are maintained. No evidence of acute fracture. Soft tissues and spinal canal: No prevertebral fluid or swelling. No visible canal hematoma. Disc levels: Multilevel facet and uncovertebral hypertrophy there degrees neural foraminal stenosis. Multilevel degenerative disease. Upper chest: Visualized lung apices are clear. IMPRESSION: 1. No  evidence of acute intracranial abnormality. 2. No evidence of acute fracture or  traumatic malalignment in the cervical spine. Electronically Signed   By: Margaretha Sheffield M.D.   On: 05/14/2022 11:23   CT Cervical Spine Wo Contrast  Result Date: 05/10/2022 CLINICAL DATA:  Head trauma, minor (Age >= 65y); Neck trauma (Age >= 65y) EXAM: CT HEAD WITHOUT CONTRAST CT CERVICAL SPINE WITHOUT CONTRAST TECHNIQUE: Multidetector CT imaging of the head and cervical spine was performed following the standard protocol without intravenous contrast. Multiplanar CT image reconstructions of the cervical spine were also generated. RADIATION DOSE REDUCTION: This exam was performed according to the departmental dose-optimization program which includes automated exposure control, adjustment of the mA and/or kV according to patient size and/or use of iterative reconstruction technique. COMPARISON:  None Available. FINDINGS: CT HEAD FINDINGS Brain: No evidence of acute infarction, hemorrhage, hydrocephalus, extra-axial collection or mass lesion/mass effect. Vascular: No hyperdense vessel identified. Skull: No acute fracture.  Left forehead contusion. Sinuses/Orbits: Paranasal sinus mucosal thickening. Other: No mastoid effusions. CT CERVICAL SPINE FINDINGS Alignment: Straightening.  No substantial sagittal subluxation. Skull base and vertebrae: Vertebral heights are maintained. No evidence of acute fracture. Soft tissues and spinal canal: No prevertebral fluid or swelling. No visible canal hematoma. Disc levels: Multilevel facet and uncovertebral hypertrophy there degrees neural foraminal stenosis. Multilevel degenerative disease. Upper chest: Visualized lung apices are clear. IMPRESSION: 1. No evidence of acute intracranial abnormality. 2. No evidence of acute fracture or traumatic malalignment in the cervical spine. Electronically Signed   By: Margaretha Sheffield M.D.   On: 04/30/2022 11:23     I spent 80 minutes for this patient encounter including review of prior medical records/discussing diagnostics and  treatment plan with the patient/family/coordinate care with primary/other specialits with greater than 50% of time in face to face encounter.   Electronically signed by:   Rosiland Oz, MD Infectious Disease Physician [facility] for Infectious Disease Pager: [phone]

## 2022-05-20 NOTE — Progress Notes (Signed)
Dr. Maryland Pink made aware patient is less alert, only alert self, delayed responses. Just concerned not sure what patients normal is. Per Dr. Mardella Layman he did see patient this am, and he is aware and that patient has delays sometimes. Will continue to monitor.

## 2022-05-20 NOTE — Progress Notes (Signed)
[facility] Progress Note  Patient: Pedro Mccoy    PFX:[id number]  DOA: 05/05/2022    Date of Service: the patient was seen and examined on 05/20/2022  Brief hospital course: 71 year old male with past medical history of metastatic pancreatic cancer on chemotherapy, hypertension, diabetes mellitus, schizophrenia and tardive dyskinesia presented to the emergency room on 11/17 with complaints of weakness, fall x2 earlier that day and abdominal pain.  Work-up with septic shock secondary to pyelonephritis, acute versus subacute fracture of L3 vertebral body and rhabdomyolysis with CPK of 22,000.  Patient admitted to the hospitalist service and started on IV fluids and antibiotics.  By 11/19, urine cultures positive for MSSA.  Following admission, on the evening of 11/17, patient went to rapid atrial flutter.  Attempted treatment with metoprolol and IV fluids.  11/19, tachycardia persisting and patient started on Cardizem drip.  Heart rate starting to improve, and attempting to wean off of Cardizem drip.  Assessment and Plan: Assessment and Plan: * Rhabdomyolysis Secondary to fall and some likely infectious component?  Responding well to IV fluids and down to 1800  Septic shock (HCC) Meets criteria for septic shock on admission given urinary source with pyelonephritis seen on CT, lactic acid level of 4.3, white blood cell count of 16.8, tachypnea and tachycardia.  Patient started on aggressive fluid resuscitation with IV fluids and antibiotics.  Blood and urine cultures are pending.  Noted significantly elevated procalcitonin of 22.9.  Following fluid resuscitation, lactic acid level has started to trend downward, but then trended back upward, and down to 1.5 x 11/18 afternoon.  While procalcitonin continues to improve, noted white blood cell count with mild increase today.  Continue to follow.    Acute pyelonephritis As above.  Urine cultures positive for MSSA.  Infectious disease to  see.  Atrial flutter ([place]) On metoprolol plus as needed Lopressor.  Remains difficult to control.  Responded to Cardizem drip.  We will start increasing oral medication so we can wean off.  Acute urinary retention Likely due to volume shifts and episodes of hypotension.  Foley catheter placed as of 11/19.  Lumbar compression fracture, closed, initial encounter ([place]) Once rhabdomyolysis and sepsis stabilized, will ask IR to evaluate for kyphoplasty.  Possibly this could be related to metastases as well  Pancreatic cancer ([place]) Metastatic mets to the liver.  Oncology following with recent chemotherapy  HTN (hypertension) Blood pressures initially elevated, but since then dropped down.  Schizophrenia ([place]) Continue home medications  Diabetes mellitus without complication (HCC) Sliding scale  Abdominal pain No evidence of pancreatitis or acute pancreatic obstruction.  Suspect may be secondary to referred pain from pyelonephritis versus compression fracture  Abnormal LFTs Mild, likely secondary to his liver metastases and also some shock liver from hypotension  Fall at home, initial encounter Secondary to weakness, rhabdo and sepsis.  Physical therapy see as patient improves       Body mass index is 23.88 kg/m.        Consultants: Palliative care Infectious disease  Procedures: None  Antimicrobials: IV Rocephin 11/17-11/19 IV Ancef 11/19-present  Code Status: Full code   Subjective: Tired, denies any shortness of breath  Objective: Heart rate mostly controlled Vitals:   05/20/22 1535 05/20/22 1652  BP:  130/73  Pulse:  94  Resp:  16  Temp: 98.2 F (36.8 C) 99.3 F (37.4 C)  SpO2:  96%    Intake/Output Summary (Last 24 hours) at 05/20/2022 1654 Last data filed at 05/20/2022 1520 Gross per 24  hour  Intake 2361.7 ml  Output 1925 ml  Net 436.7 ml   Filed Weights   05/20/2022 1042  Weight: 82.1 kg   Body mass index is 23.88  kg/m.  Exam:  General: Fatigued, no acute distress HEENT: Normocephalic, atraumatic, mucous membranes are dry Cardiovascular: Currently irregular rhythm, rate controlled Respiratory: Clear to auscultation bilaterally Abdomen: Soft, nontender, nondistended, hypoactive bowel sounds Musculoskeletal: No clubbing or cyanosis or edema Skin: No skin breaks, tears or lesions Psychiatry: Currently resting comfortably, no evidence of acute psychoses Neurology: No immediate focal deficits  Data Reviewed: CK at 847.  Procalcitonin continues to trend downward.  White blood cell count staying elevated.  Disposition:  Status is: Inpatient Remains inpatient appropriate because:  -Treatment of sepsis, follow-up on staph infection -Control atrial fibrillation on oral medications -Transition rate control medications over to p.o.    Anticipated discharge date: 11/24  Family Communication: Left message for sister DVT Prophylaxis: enoxaparin (LOVENOX) injection 40 mg Start: 05/07/2022 1600   Author: Annita Brod ,MD 05/20/2022 4:54 PM  To reach On-call, see care teams to locate the attending and reach out via [email or web address]. Between 7PM-7AM, please contact night-coverage If you still have difficulty reaching the attending provider, please page the [facility] (Director on Call) for [facility] on amion for assistance.

## 2022-05-20 NOTE — TOC Progression Note (Signed)
Transition of Care [facility]) - Progression Note    Patient Details  Name: Pedro Mccoy MRN: [id number] Date of Birth: 20-Jul-1950  Transition of [facility]) CM/SW [place], RN Phone Number: 05/20/2022, 10:14 AM  Clinical Narrative:    Will continue to monitor for therapy evaluation and TOC needs.         Expected Discharge Plan and Services                                                 Social Determinants of Health (SDOH) Interventions Food Insecurity Interventions: Intervention Not Indicated  Readmission Risk Interventions    05/22/2021   10:48 AM  Readmission Risk Prevention Plan  Transportation Screening Complete  PCP or Specialist Appt within 3-5 Days Complete  HRI or [place] Complete  Social Work Consult for [place] Planning/Counseling Complete  Palliative Care Screening Not Applicable  Medication Review Press photographer) Complete

## 2022-05-21 DIAGNOSIS — M6282 Rhabdomyolysis: Secondary | ICD-10-CM | POA: Diagnosis not present

## 2022-05-21 DIAGNOSIS — N10 Acute tubulo-interstitial nephritis: Secondary | ICD-10-CM | POA: Diagnosis not present

## 2022-05-21 DIAGNOSIS — C259 Malignant neoplasm of pancreas, unspecified: Secondary | ICD-10-CM | POA: Diagnosis not present

## 2022-05-21 DIAGNOSIS — T796XXA Traumatic ischemia of muscle, initial encounter: Secondary | ICD-10-CM | POA: Diagnosis not present

## 2022-05-21 LAB — CBC
HCT: 36.7 % — ABNORMAL LOW (ref 39.0–52.0)
Hemoglobin: 12.6 g/dL — ABNORMAL LOW (ref 13.0–17.0)
MCH: 27.9 pg (ref 26.0–34.0)
MCHC: 34.3 g/dL (ref 30.0–36.0)
MCV: 81.2 fL (ref 80.0–100.0)
Platelets: 185 10*3/uL (ref 150–400)
RBC: 4.52 MIL/uL (ref 4.22–5.81)
RDW: 13.8 % (ref 11.5–15.5)
WBC: 16.7 10*3/uL — ABNORMAL HIGH (ref 4.0–10.5)
nRBC: 0 % (ref 0.0–0.2)

## 2022-05-21 LAB — BASIC METABOLIC PANEL
Anion gap: 7 (ref 5–15)
BUN: 20 mg/dL (ref 8–23)
CO2: 21 mmol/L — ABNORMAL LOW (ref 22–32)
Calcium: 7.9 mg/dL — ABNORMAL LOW (ref 8.9–10.3)
Chloride: 109 mmol/L (ref 98–111)
Creatinine, Ser: 0.83 mg/dL (ref 0.61–1.24)
GFR, Estimated: >60 mL/min (ref >60)
Glucose, Bld: 175 mg/dL — ABNORMAL HIGH (ref 70–99)
Potassium: 2.8 mmol/L — ABNORMAL LOW (ref 3.5–5.1)
Sodium: 137 mmol/L (ref 135–145)

## 2022-05-21 LAB — CULTURE, BLOOD (SINGLE): Culture: NO GROWTH

## 2022-05-21 LAB — GLUCOSE, CAPILLARY
Glucose-Capillary: 171 mg/dL — ABNORMAL HIGH (ref 70–99)
Glucose-Capillary: 203 mg/dL — ABNORMAL HIGH (ref 70–99)
Glucose-Capillary: 198 mg/dL — ABNORMAL HIGH (ref 70–99)
Glucose-Capillary: 226 mg/dL — ABNORMAL HIGH (ref 70–99)

## 2022-05-21 LAB — PROCALCITONIN: Procalcitonin: 2.19 ng/mL

## 2022-05-21 MED ORDER — POLYETHYLENE GLYCOL 3350 17 G PO PACK
17.0000 g | PACK | Freq: Two times a day (BID) | ORAL | Status: AC
  Administered 2022-05-21 (×2): 17 g via ORAL
  Filled 2022-05-21 (×2): qty 1

## 2022-05-21 MED ORDER — MIDODRINE HCL 5 MG PO TABS
2.5000 mg | ORAL_TABLET | Freq: Three times a day (TID) | ORAL | Status: DC
  Administered 2022-05-21 – 2022-05-22 (×4): 2.5 mg via ORAL
  Filled 2022-05-21 (×4): qty 1

## 2022-05-21 MED ORDER — APIXABAN 5 MG PO TABS
5.0000 mg | ORAL_TABLET | Freq: Two times a day (BID) | ORAL | Status: DC
  Administered 2022-05-21 – 2022-05-22 (×2): 5 mg via ORAL
  Filled 2022-05-21 (×2): qty 1

## 2022-05-21 MED ORDER — POTASSIUM CHLORIDE CRYS ER 20 MEQ PO TBCR
40.0000 meq | EXTENDED_RELEASE_TABLET | ORAL | Status: AC
  Administered 2022-05-21 (×2): 40 meq via ORAL
  Filled 2022-05-21: qty 2

## 2022-05-21 NOTE — Evaluation (Signed)
Occupational Therapy Evaluation Patient Details Name: Pedro Mccoy MRN: [id number] DOB: 03-11-1951 Today's Date: 05/21/2022   History of Present Illness 71 year old male with past medical history of metastatic pancreatic cancer on chemotherapy, hypertension, diabetes mellitus, schizophrenia and tardive dyskinesia presented to the emergency room on 11/17 with complaints of weakness, fall x2 earlier that day and abdominal pain.  Work-up with septic shock secondary to pyelonephritis, acute versus subacute fracture of L3 vertebral body and rhabdomyolysis with CPK of 22,000.  Patient admitted to the hospitalist service and started on IV fluids and antibiotics.  By 11/19, urine cultures positive for MSSA.     Following admission, on the evening of 11/17, patient went to rapid atrial flutter.  Attempted treatment with metoprolol and IV fluids.  11/19, tachycardia persisting and patient started on Cardizem drip.  Heart rate starting to improve, and attempting to wean off of Cardizem drip.   Clinical Impression   Patient presenting with decreased Ind in self care,balance, functional mobility/transfers, endurance, and safety awareness. Per chart review and pt report, he lives in an apartment alone with his 2 cats. Pt gives the names of cats and verbalized the correct year but does not appear to be oriented to situation or location. Pt washes face with cloth placed in hand and cues given. He begins to move B LEs towards EOB but needing total A for mobility with back precautions. Pt becoming very resistive and unable to tolerate EOB. Pt returned to bed with total A for repositoning. He appears to be far from baseline. Patient will benefit from acute OT to increase overall independence in the areas of ADLs, functional mobility, and safety awareness in order to safely discharge to next venue of care.      Recommendations for follow up therapy are one component of a multi-disciplinary discharge planning process, led  by the attending physician.  Recommendations may be updated based on patient status, additional functional criteria and insurance authorization.   Follow Up Recommendations  Skilled nursing-short term rehab (<3 hours/day)     Assistance Recommended at Discharge Frequent or constant Supervision/Assistance  Patient can return home with the following Two people to help with walking and/or transfers;Two people to help with bathing/dressing/bathroom;Help with stairs or ramp for entrance;Assist for transportation;Direct supervision/assist for financial management;Direct supervision/assist for medications management    Functional Status Assessment  Patient has had a recent decline in their functional status and demonstrates the ability to make significant improvements in function in a reasonable and predictable amount of time.  Equipment Recommendations  Other (comment) (defer to next venue of care)       Precautions / Restrictions Precautions Precautions: Back;Fall Precaution Booklet Issued: No      Mobility Bed Mobility Overal bed mobility: Needs Assistance Bed Mobility: Supine to Sit, Sit to Supine     Supine to sit: Max assist Sit to supine: Total assist   General bed mobility comments: pt attempting to move B LEs towards EOB but once EOB becomes very resistive with posterior bias    Transfers                   General transfer comment: not attempted secondary to safety concerns          ADL either performed or assessed with clinical judgement   ADL Overall ADL's : Needs assistance/impaired     Grooming: Wash/dry face;Minimal assistance;Bed level  Vision Patient Visual Report: No change from baseline              Pertinent Vitals/Pain Pain Assessment Pain Assessment: Faces Faces Pain Scale: Hurts even more Pain Location: neck Pain Descriptors / Indicators: Discomfort Pain Intervention(s): Patient  requesting pain meds-RN notified, Repositioned, Monitored during session     Hand Dominance     Extremity/Trunk Assessment Upper Extremity Assessment Upper Extremity Assessment: Difficult to assess due to impaired cognition;Generalized weakness   Lower Extremity Assessment Lower Extremity Assessment: Difficult to assess due to impaired cognition;Generalized weakness       Communication Communication Communication: Expressive difficulties   Cognition Arousal/Alertness: Lethargic, Awake/alert Behavior During Therapy: Flat affect Overall Cognitive Status: Impaired/Different from baseline                                 General Comments: Pt is oriented to self and year. Increased time to process and max multimodal cuing for command following.                Home Living Family/patient expects to be discharged to:: Private residence Living Arrangements: Alone   Type of Home: Apartment Home Access: Level entry     Home Layout: One level     Bathroom Shower/Tub: International aid/development worker Accessibility: Yes              Prior Functioning/Environment Prior Level of Function : Independent/Modified Independent;Driving             Mobility Comments: per chart review, pt driving himself to chemo and lives alone ADLs Comments: Ind with ADLs and IADLs        OT Problem List: Decreased strength;Decreased coordination;Pain;Decreased cognition;Decreased activity tolerance;Decreased safety awareness;Impaired balance (sitting and/or standing);Decreased knowledge of use of DME or AE      OT Treatment/Interventions: Self-care/ADL training;Therapeutic exercise;Therapeutic activities;Cognitive remediation/compensation;DME and/or AE instruction;Patient/family education;Balance training    OT Goals(Current goals can be found in the care plan section) Acute Rehab OT Goals Patient Stated Goal: none stated OT Goal Formulation: Patient unable to participate in  goal setting Time For Goal Achievement: 06/04/22 Potential to Achieve Goals: Poor ADL Goals Pt Will Perform Upper Body Bathing: with min assist;sitting Pt Will Perform Lower Body Dressing: with mod assist Pt Will Transfer to Toilet: with mod assist Pt Will Perform Toileting - Clothing Manipulation and hygiene: with mod assist  OT Frequency: Min 2X/week       AM-PAC OT "6 Clicks" Daily Activity     Outcome Measure Help from another person eating meals?: A Lot Help from another person taking care of personal grooming?: A Lot Help from another person toileting, which includes using toliet, bedpan, or urinal?: Total Help from another person bathing (including washing, rinsing, drying)?: Total Help from another person to put on and taking off regular upper body clothing?: A Lot Help from another person to put on and taking off regular lower body clothing?: Total 6 Click Score: 9   End of Session Nurse Communication: Mobility status;Patient requests pain meds  Activity Tolerance: Patient limited by pain;Other (comment) (cognition) Patient left: in bed;with call bell/phone within reach;with bed alarm set  OT Visit Diagnosis: Unsteadiness on feet (R26.81);Repeated falls (R29.6);Muscle weakness (generalized) (M62.81)                Time: 7169-6789 OT Time Calculation (min): 15 min Charges:  OT General Charges $OT Visit: 1 Visit OT Evaluation $  OT Eval Moderate Complexity: 1 [address] [place], OTR/L , CBIS ascom [phone]  05/21/22, 2:03 PM

## 2022-05-21 NOTE — Progress Notes (Signed)
Palliative Care Progress Note, Assessment & Plan   Patient Name: Pedro Mccoy       Date: 05/21/2022 DOB: [birth year]  Age: 71 y.o. MRN#: [id number] Attending Physician: Sidney Ace, MD Primary Care Physician: Romualdo Bolk, FNP Admit Date: 05/07/2022  Reason for Consultation/Follow-up: Establishing goals of care  Subjective: Patient is lying in bed in no apparent distress.  He easily awakens.  He acknowledges my presence and is able to make his wishes known.  When asked where he was he says he knows he is in his aunts house.  When asked how old he is, what is his birthday, or what year rear-end, patient was unable to respond appropriately.  Patient was able to say that he was not experiencing any pain or discomfort at this time.  No family present at bedside.   HPI: 71 y.o. male  with past medical history of prostate cancer with known metastasis to liver, HTN, type 2 diabetes, schizophrenia (visual hallucinations), tardive dyskinesia, and right total hip replacement admitted on 05/01/2022 with weakness and falls (X2) at home.   Patient is being treated for septic shock secondary to pyelonephritis as well as L3 vertebral fracture, rhabdomyolysis, and urine cultures positive for MSSA.   On 11/19, patient became tachycardic, failed to respond to metoprolol and IVF, and was placed on Cardizem gtt.   PMT was consulted to discuss goals of care.  Summary of counseling/coordination of care: After reviewing the patient's chart and assessing the patient at bedside, I counseled with Dr. Priscella Mann in regards to patient's plan of care. I then spoke with patient's sister and [facility] decision maker Lattie Haw over the phone.  Brief medical update given. Education provided on metastatic nature of pancreatic cancer as  debilitating chronic issue that is compounded bu acute issues of elevated WBC count and UTI. Patient's functional, nutritional, and cognitive statues discussed. Lattie Haw had no questions/concerns regarding patient's current medical condition other then to ask if patient would be able to receive chemo as scheduled - next appt 11/30. I shared I would convey this concern to patient's attending.    I attempted to elicit goals and wishes important to the patient. Lattie Haw shares she is most concerned about patient's cats who are in his apartment without a caregiver at the moment. I attempted to redirect conversation to discuss boundaries and goals of care. However, Lattie Haw shares she is immediately more worried about his cats.   Pt to work with PT/OT to help establish disposition. Ongoing GOC and support from PMT will continue.    Physical Exam Vitals reviewed.  HENT:     Mouth/Throat:     Mouth: Mucous membranes are moist.  Eyes:     Pupils: Pupils are equal, round, and reactive to light.  Cardiovascular:     Rate and Rhythm: Normal rate.     Pulses: Normal pulses.  Pulmonary:     Effort: Pulmonary effort is normal.  Abdominal:     Palpations: Abdomen is soft.  Musculoskeletal:        General: Normal range of motion.     Comments: MAETC, generalized weakness  Neurological:     Mental Status: He is alert.     Comments: Oriented  to self  Psychiatric:        Mood and Affect: Mood normal.             Palliative Assessment/Data: 30-40%    Total Time 25 minutes  Greater than 50%  of this time was spent counseling and coordinating care related to the above assessment and plan.  Thank you for allowing the Palliative Medicine Team to assist in the care of this patient.  [place] Ilsa Iha, FNP-BC Palliative Medicine Team Team Phone # [phone]

## 2022-05-21 NOTE — Care Management Important Message (Signed)
Important Message  Patient Details  Name: Pedro Mccoy MRN: [id number] Date of Birth: [birth year]   Medicare Important Message Given:  Yes     [name] 05/21/2022, 10:34 AM

## 2022-05-21 NOTE — Progress Notes (Addendum)
ID Brief Note  T max 100  Labs remarkable for k 2.8 WBC trending up to 16.7  Continue cefazolin  Consider MRI T L spine to look for epidural abscess/discitis, staph aureus rather unusual organism to have UTI/pyelonephritis and question underlying deep seated infection  Monitor for metastatic sites of infection with ? occult staph bacteremia  Dr Delaine Lame to follow from tomorrow  Rosiland Oz, MD Infectious Disease Physician [facility] [address] [place], [place] [address] Phone: [phone]  Fax: [phone]'

## 2022-05-21 NOTE — Progress Notes (Signed)
PT Cancellation Note  Patient Details Name: Silverio Hagan MRN: [id number] DOB: 09-27-1950   Cancelled Treatment:    Reason Eval/Treat Not Completed: Other (comment). Consult received and chart reviewed. Pt says brief 1 word hello and then has blank stare the remainder of time therapist present in room. Unable to follow commands for grip squeezes or B LE movement and resistive to attempts at mobility. Phone rang during session with therapist placing phone to ear. Pt states hello but unable to carry conversation and caller hung up. Pt unable to hold phone without assist. Not appropriate for PT consult. Will re-attempt next available date.   Nathan Stallworth 05/21/2022, 11:47 AM Greggory Stallion, PT, DPT, GCS [phone]

## 2022-05-21 NOTE — Progress Notes (Signed)
PROGRESS NOTE    Pedro Mccoy  GYF:[id number] DOB: 05-Jan-1951 DOA: 05/22/2022 PCP: Romualdo Bolk, FNP    Brief Narrative:  71 year old male with past medical history of metastatic pancreatic cancer on chemotherapy, hypertension, diabetes mellitus, schizophrenia and tardive dyskinesia presented to the emergency room on 11/17 with complaints of weakness, fall x2 earlier that day and abdominal pain.  Work-up with septic shock secondary to pyelonephritis, acute versus subacute fracture of L3 vertebral body and rhabdomyolysis with CPK of 22,000.  Patient admitted to the hospitalist service and started on IV fluids and antibiotics.  By 11/19, urine cultures positive for MSSA.   Following admission, on the evening of 11/17, patient went to rapid atrial flutter.  Attempted treatment with metoprolol and IV fluids.  11/19, tachycardia persisting and patient started on Cardizem drip.  Heart rate starting to improve, and attempting to wean off of Cardizem drip. GTT weaned off 11/21.  Rates stable on PO cardizem and metop   Assessment & Plan:   Principal Problem:   Rhabdomyolysis Active Problems:   Septic shock ([place])   UTI (urinary tract infection)   Acute pyelonephritis   Atrial flutter (HCC)   Acute urinary retention   Pancreatic cancer ([place])   HLD (hyperlipidemia)   Lumbar compression fracture, closed, initial encounter ([place])   HTN (hypertension)   Schizophrenia ([place])   Depression with anxiety   Diabetes mellitus without complication ([place])   Abdominal pain   Abnormal LFTs   Fall at home, initial encounter  Rhabdomyolysis Secondary to fall and some likely infectious component?  Responding well to IV fluids and down to 847.  No need to continue to trend   Septic shock ([place]) Meets criteria for septic shock on admission given urinary source with pyelonephritis seen on CT, lactic acid level of 4.3, white blood cell count of 16.8, tachypnea and tachycardia.  Patient started on aggressive  fluid resuscitation with IV fluids and antibiotics.  Blood culture no growth.  Urine culture with Staph aureus.  ID consulted. On ancef.       Acute pyelonephritis As above.  Urine cultures positive for MSSA.  Infectious disease consulted   Atrial flutter ([place]) On metoprolol plus as needed Lopressor.  Remains difficult to control.  Responded to Cardizem drip.  Drip weaned off.  Now on PO cardizem.  Start PO eliquis for VTE proph   Acute urinary retention Likely due to volume shifts and episodes of hypotension.  Foley catheter placed as of 11/19.   Lumbar compression fracture, closed, initial encounter ([place]) Once rhabdomyolysis and sepsis stabilized, will ask IR to evaluate for kyphoplasty.  Possibly this could be related to metastases as well.  May be deferred to outpatient.   Pancreatic cancer ([place]) Metastatic mets to the liver.  Oncology following with recent chemotherapy.  Sees onc at [facility].  Next chemo scheduled for 11/30   HTN (hypertension) Blood pressures initially elevated, but since then dropped down.  Was on midodrine.  Wean to 2.5 TID with plans to dc tomorrow   Schizophrenia ([place]) Continue home medications   Diabetes mellitus without complication (HCC) Sliding scale   Abdominal pain No evidence of pancreatitis or acute pancreatic obstruction.  Suspect may be secondary to referred pain from pyelonephritis versus compression fracture   Abnormal LFTs Mild, likely secondary to his liver metastases and also some shock liver from hypotension   Fall at home, initial encounter Secondary to weakness, rhabdo and sepsis.  Physical therapy see as patient improves   DVT prophylaxis: Eliquis  Code Status: FULL Family Communication:None today Disposition Plan: Status is: Inpatient Remains inpatient appropriate because: Unsafe dispo plan, needs therapy evaluations.  Possible dispo to SNF.   Level of care: Progressive  Consultants:  Palliative care  Procedures:   none  Antimicrobials: Ancef   Subjective: Seen and examined.  No specific complaints.  Poor historian  Objective: Vitals:   05/21/22 0023 05/21/22 0305 05/21/22 0751 05/21/22 1152  BP: (!) 146/97 133/79 132/80 136/87  Pulse: (!) 58 99 (!) 108 91  Resp: 19 20 (!) 24 14  Temp: 100 F (37.8 C) 98.2 F (36.8 C) 98.1 F (36.7 C) 99.4 F (37.4 C)  TempSrc: Axillary  Oral   SpO2: 96% 94% 95% 96%  Weight:      Height:        Intake/Output Summary (Last 24 hours) at 05/21/2022 1516 Last data filed at 05/21/2022 1023 Gross per 24 hour  Intake 675.34 ml  Output 3100 ml  Net -2424.66 ml   Filed Weights   05/24/2022 1042  Weight: 82.1 kg    Examination:  General exam: Appears calm and comfortable  Respiratory system: Clear to auscultation. Respiratory effort normal. Cardiovascular system: Tachy, irreg rhythm, no murmur Gastrointestinal system: Soft, NTND, normal BS Central nervous system: Alert and oriented x 2. No focal neurological deficits. Extremities: Symmetric 5 x 5 power. Skin: No rashes, lesions or ulcers Psychiatry: Judgement and insight appear impaired. Mood & affect flattened.     Data Reviewed: I have personally reviewed following labs and imaging studies  CBC: Recent Labs  Lab 05/25/2022 1111 05/17/22 0457 05/18/22 0505 05/19/22 0623 05/20/22 0443 05/21/22 0340  WBC 16.8* 13.4* 12.3* 14.7* 15.0* 16.7*  NEUTROABS 15.3*  --   --   --   --   --   HGB 14.8 12.6* 11.9* 11.5* 12.0* 12.6*  HCT 44.1 36.8* 33.7* 33.5* 34.4* 36.7*  MCV 83.7 82.0 80.6 82.1 82.1 81.2  PLT 155 102* 113* 115* 142* 161   Basic Metabolic Panel: Recent Labs  Lab 05/17/22 0457 05/18/22 0505 05/19/22 0452 05/20/22 0443 05/21/22 0340  NA 136 133* 135 137 137  K 3.1* 3.0* 3.8 3.3* 2.8*  CL 109 109 112* 112* 109  CO2 16* 16* 14* 16* 21*  GLUCOSE 154* 123* 174* 176* 175*  BUN 17 25* '22 20 20  '$ CREATININE 0.83 0.90 0.85 0.89 0.83  CALCIUM 8.0* 7.6* 7.9* 7.7* 7.9*    GFR: Estimated Creatinine Clearance: 92.3 mL/min (by C-G formula based on SCr of 0.83 mg/dL). Liver Function Tests: Recent Labs  Lab 05/18/2022 1111 05/17/22 0457 05/18/22 0505 05/19/22 0452 05/20/22 0443  AST 389* 274* 130* 77* 49*  ALT 69* 67* 58* 50* 37  ALKPHOS 104 70 64 87 139*  BILITOT 1.2 1.2 1.0 0.8 1.2  PROT 6.8 5.3* 4.9* 5.0* 5.0*  ALBUMIN 3.8 2.8* 2.5* 2.3* 2.1*   Recent Labs  Lab 05/01/2022 1111  LIPASE 22   No results for input(s): "AMMONIA" in the last 168 hours. Coagulation Profile: Recent Labs  Lab 05/02/2022 1111  INR 1.3*   Cardiac Enzymes: Recent Labs  Lab 05/20/2022 1111 05/17/22 0457 05/18/22 0505 05/19/22 0452 05/20/22 0443  CKTOTAL 22,395* 11,093* 4,035* 1,835* 847*   BNP (last 3 results) No results for input(s): "PROBNP" in the last 8760 hours. HbA1C: No results for input(s): "HGBA1C" in the last 72 hours. CBG: Recent Labs  Lab 05/20/22 1214 05/20/22 1652 05/20/22 2115 05/21/22 0752 05/21/22 1152  GLUCAP 192* 195* 167* 171* 203*  Lipid Profile: No results for input(s): "CHOL", "HDL", "LDLCALC", "TRIG", "CHOLHDL", "LDLDIRECT" in the last 72 hours. Thyroid Function Tests: No results for input(s): "TSH", "T4TOTAL", "FREET4", "T3FREE", "THYROIDAB" in the last 72 hours. Anemia Panel: No results for input(s): "VITAMINB12", "FOLATE", "FERRITIN", "TIBC", "IRON", "RETICCTPCT" in the last 72 hours. Sepsis Labs: Recent Labs  Lab 05/29/2022 2127 05/17/22 0457 05/17/22 0918 05/17/22 1328 05/17/22 1517 05/18/22 0505 05/19/22 0452 05/20/22 0443 05/21/22 0340  PROCALCITON  --    < >  --   --   --  14.72 6.50 3.50 2.19  LATICACIDVEN 3.2*  --  2.3* 1.8 1.5  --   --   --   --    < > = values in this interval not displayed.    Recent Results (from the past 240 hour(s))  Urine Culture     Status: Abnormal   Collection Time: 05/08/2022 12:57 PM   Specimen: Urine, Clean Catch  Result Value Ref Range Status   Specimen Description URINE, CLEAN  CATCH  Final   Special Requests NONE  Final   Culture >=100,000 COLONIES/mL STAPHYLOCOCCUS AUREUS (A)  Final   Report Status 05/18/2022 FINAL  Final   Organism ID, Bacteria STAPHYLOCOCCUS AUREUS (A)  Final      Susceptibility   Staphylococcus aureus - MIC*    CIPROFLOXACIN <=0.5 SENSITIVE Sensitive     GENTAMICIN <=0.5 SENSITIVE Sensitive     NITROFURANTOIN <=16 SENSITIVE Sensitive     OXACILLIN <=0.25 SENSITIVE Sensitive     TETRACYCLINE <=1 SENSITIVE Sensitive     VANCOMYCIN <=0.5 SENSITIVE Sensitive     TRIMETH/SULFA <=10 SENSITIVE Sensitive     CLINDAMYCIN <=0.25 SENSITIVE Sensitive     RIFAMPIN <=0.5 SENSITIVE Sensitive     Inducible Clindamycin NEGATIVE Sensitive     * >=100,000 COLONIES/mL STAPHYLOCOCCUS AUREUS  Culture, blood (single)     Status: None   Collection Time: 05/24/2022  3:00 PM   Specimen: BLOOD  Result Value Ref Range Status   Specimen Description BLOOD BLOOD RIGHT ARM  Final   Special Requests   Final    BOTTLES DRAWN AEROBIC AND ANAEROBIC Blood Culture results may not be optimal due to an excessive volume of blood received in culture bottles   Culture   Final    NO GROWTH 5 DAYS Performed at [facility], [address] [place], [place] [address]    Report Status 05/21/2022 FINAL  Final         Radiology Studies: No results found.      Scheduled Meds:  apixaban  5 mg Oral BID   Chlorhexidine Gluconate Cloth  6 each Topical Daily   diltiazem  360 mg Oral Daily   FLUoxetine  40 mg Oral Daily   fluvoxaMINE  100 mg Oral BID   furosemide  20 mg Intravenous BID   insulin aspart  0-5 Units Subcutaneous QHS   insulin aspart  0-9 Units Subcutaneous TID WC   lamoTRIgine  200 mg Oral BID   lidocaine  1 patch Transdermal Q24H   metoprolol succinate  100 mg Oral Daily   midodrine  2.5 mg Oral TID WC   omega-3 acid ethyl esters  1 g Oral Daily   polyethylene glycol  17 g Oral BID   risperiDONE  3 mg Oral QHS   sodium bicarbonate  650 mg  Oral BID   Continuous Infusions:   ceFAZolin (ANCEF) IV 2 g (05/21/22 1439)     LOS: 5 days  Sidney Ace, MD [facility]   If 7PM-7AM, please contact night-coverage  05/21/2022, 3:16 PM

## 2022-05-22 ENCOUNTER — Inpatient Hospital Stay: Payer: Medicare Other

## 2022-05-22 ENCOUNTER — Encounter: Payer: Self-pay | Admitting: Radiology

## 2022-05-22 DIAGNOSIS — Y92009 Unspecified place in unspecified non-institutional (private) residence as the place of occurrence of the external cause: Secondary | ICD-10-CM

## 2022-05-22 DIAGNOSIS — I484 Atypical atrial flutter: Secondary | ICD-10-CM | POA: Diagnosis not present

## 2022-05-22 DIAGNOSIS — M4646 Discitis, unspecified, lumbar region: Secondary | ICD-10-CM

## 2022-05-22 DIAGNOSIS — R338 Other retention of urine: Secondary | ICD-10-CM

## 2022-05-22 DIAGNOSIS — A4189 Other specified sepsis: Secondary | ICD-10-CM

## 2022-05-22 DIAGNOSIS — W19XXXA Unspecified fall, initial encounter: Secondary | ICD-10-CM | POA: Diagnosis not present

## 2022-05-22 DIAGNOSIS — R6521 Severe sepsis with septic shock: Secondary | ICD-10-CM | POA: Diagnosis not present

## 2022-05-22 DIAGNOSIS — K6812 Psoas muscle abscess: Secondary | ICD-10-CM

## 2022-05-22 DIAGNOSIS — T796XXA Traumatic ischemia of muscle, initial encounter: Secondary | ICD-10-CM | POA: Diagnosis not present

## 2022-05-22 DIAGNOSIS — G061 Intraspinal abscess and granuloma: Secondary | ICD-10-CM

## 2022-05-22 DIAGNOSIS — M4620 Osteomyelitis of vertebra, site unspecified: Secondary | ICD-10-CM

## 2022-05-22 LAB — LIPID PANEL
Cholesterol: 68 mg/dL (ref 0–200)
HDL: <10 mg/dL — ABNORMAL LOW (ref >40)
Triglycerides: 114 mg/dL (ref <150)
VLDL: 23 mg/dL (ref 0–40)

## 2022-05-22 LAB — CBC
HCT: 36.7 % — ABNORMAL LOW (ref 39.0–52.0)
Hemoglobin: 12.6 g/dL — ABNORMAL LOW (ref 13.0–17.0)
MCH: 28.2 pg (ref 26.0–34.0)
MCHC: 34.3 g/dL (ref 30.0–36.0)
MCV: 82.1 fL (ref 80.0–100.0)
Platelets: 230 10*3/uL (ref 150–400)
RBC: 4.47 MIL/uL (ref 4.22–5.81)
RDW: 13.9 % (ref 11.5–15.5)
WBC: 19.3 10*3/uL — ABNORMAL HIGH (ref 4.0–10.5)
nRBC: 0 % (ref 0.0–0.2)

## 2022-05-22 LAB — GLUCOSE, CAPILLARY
Glucose-Capillary: 207 mg/dL — ABNORMAL HIGH (ref 70–99)
Glucose-Capillary: 208 mg/dL — ABNORMAL HIGH (ref 70–99)
Glucose-Capillary: 184 mg/dL — ABNORMAL HIGH (ref 70–99)
Glucose-Capillary: 153 mg/dL — ABNORMAL HIGH (ref 70–99)

## 2022-05-22 LAB — BASIC METABOLIC PANEL
Anion gap: 9 (ref 5–15)
BUN: 25 mg/dL — ABNORMAL HIGH (ref 8–23)
CO2: 22 mmol/L (ref 22–32)
Calcium: 8 mg/dL — ABNORMAL LOW (ref 8.9–10.3)
Chloride: 109 mmol/L (ref 98–111)
Creatinine, Ser: 0.94 mg/dL (ref 0.61–1.24)
GFR, Estimated: >60 mL/min (ref >60)
Glucose, Bld: 172 mg/dL — ABNORMAL HIGH (ref 70–99)
Potassium: 2.9 mmol/L — ABNORMAL LOW (ref 3.5–5.1)
Sodium: 140 mmol/L (ref 135–145)

## 2022-05-22 MED ORDER — GADOBUTROL 1 MMOL/ML IV SOLN
8.0000 mL | Freq: Once | INTRAVENOUS | Status: AC | PRN
  Administered 2022-05-22: 8 mL via INTRAVENOUS

## 2022-05-22 NOTE — Progress Notes (Signed)
Patient's sister [name] notified this RN that she is taking the patient's "phone, wallet, and dirty clothes home" at this time.

## 2022-05-22 NOTE — Progress Notes (Signed)
Date of Admission:  05/27/2022      ID: Pedro Mccoy is a 71 y.o. male  Principal Problem:   Rhabdomyolysis Active Problems:   Schizophrenia ([place])   Fall at home, initial encounter   Pancreatic cancer ([place])   HTN (hypertension)   Abdominal pain   Abnormal LFTs   Depression with anxiety   HLD (hyperlipidemia)   Diabetes mellitus without complication ([place])   UTI (urinary tract infection)   Septic shock (HCC)   Acute pyelonephritis   Lumbar compression fracture, closed, initial encounter ([place])   Atrial flutter ([place])   Acute urinary retention  Ca pancreas ( a pancreatic mass incidentally noted on CT when he had a rt hip fracture in Nov 2022, a repeat CT in Aug 2023 showed increase in the size and he underwent 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   He presented to the ED on 05/20/2022 after a fall and initially refused to come to ED and later was brought in as he had fallen again and could not get off the floor  04/30/2022  BP 140/78 !  Temp 98.4 F (36.9 C)  Pulse Rate 93  Resp 18  SpO2 96 %  Weight 181 lb    Latest Reference Range & Units 05/08/2022  WBC 4.0 - 10.5 K/uL 16.8 (H)  Hemoglobin 13.0 - 17.0 g/dL 14.8  HCT 39.0 - 52.0 % 44.1  Platelets 150 - 400 K/uL 155  Creatinine 0.61 - 1.24 mg/dL 1.19  1 set blood culture NG UA 11-20 WBC CT abdomen Distended bladder Possible rt pyelo Has foley placed in the hospital UC MSSA Concern for MSSA disseminated infection VS bacteremia as MSSA is not a common organism to cause UTI IDP wanted MRI of the Lumbar spine    Subjective: Pt is weak Says he is not sure how he fell   Medications:   apixaban  5 mg Oral BID   Chlorhexidine Gluconate Cloth  6 each Topical Daily   diltiazem  360 mg Oral Daily   FLUoxetine  40 mg Oral Daily    fluvoxaMINE  100 mg Oral BID   furosemide  20 mg Intravenous BID   insulin aspart  0-5 Units Subcutaneous QHS   insulin aspart  0-9 Units Subcutaneous TID WC   lamoTRIgine  200 mg Oral BID   lidocaine  1 patch Transdermal Q24H   metoprolol succinate  100 mg Oral Daily   omega-3 acid ethyl esters  1 g Oral Daily   risperiDONE  3 mg Oral QHS   sodium bicarbonate  650 mg Oral BID    Objective: Vital signs in last 24 hours: Temp:  [98.4 F (36.9 C)-99.6 F (37.6 C)] 98.8 F (37.1 C) (11/23 1110) Pulse Rate:  [83-107] 85 (11/23 1110) Resp:  [18-24] 18 (11/23 0718) BP: (115-149)/(79-101) 115/89 (11/23 1110) SpO2:  [95 %-97 %] 95 % (11/23 1110)  LDA PORT- has not been accessed  PHYSICAL EXAM:  General: Awake, weak, responds appropriately to questions  Head: Normocephalic, without obvious abnormality, atraumatic. Eyes: Conjunctivae clear, anicteric sclerae. Pupils are equal ENT left ear- injury Left scapl carper burn like injury .  Sacrum deep tissue injury  Lips, mucosa, and tongue normal. No Thrush Back: sacrum- DTI Lungs:b/l air entry Heart: irregular Abdomen: Soft, non-tender,not distended. Bowel sounds normal. No masses Extremities: atraumatic, no cyanosis. No  edema. No clubbing Skin: as above Lymph: Cervical, supraclavicular normal. Neurologic: cannot assess in detail  Lab Results Recent Labs    05/21/22 0340 05/22/22 0511  WBC 16.7* 19.3*  HGB 12.6* 12.6*  HCT 36.7* 36.7*  NA 137 140  K 2.8* 2.9*  CL 109 109  CO2 21* 22  BUN 20 25*  CREATININE 0.83 0.94   Liver Panel Recent Labs    05/20/22 0443  PROT 5.0*  ALBUMIN 2.1*  AST 49*  ALT 37  ALKPHOS 139*  BILITOT 1.2    Microbiology: One blood culture staph epi and staph hominis  Studies/Results:    Assessment/Plan: Metastatic pancreatic adenocarcinoma ( mets to liver) Has received first  dose of chemo on 04/24/22  Fall unclear etiology-Urinary retention MSSA in urine culture One blood  culture has staph epidermidis and staph hominis- likely contaminants?  but concern for disseminated MSSA infection MRI shows L2-L3  discitis and osteomyelitis of L2 and most of L3 Left psoas abscess Epidural abscess  L2-L4 Pathological Compression fracture  L3 Worsening leucocytosis Recommend neurosurgery opinion on the feasibility of surgery for diagnosis and to decrease bioburden  Need aspiration of the abscess for culture Send repeat blood cultures PATIENT HAS PORT BUT HAS NOT BEEN ACCESSED DO NOT ACCESS PORT    Currently on cefazolin--but cannot take for granted that the lumbar spine infection is due to MSSA Need either a positive blood culture and or an epidural/psoas abscess culture for MSSA If we cannot get confirmation then may have to expand antibiotic coverage to vanco and ceftriaxone  Rhabdomyolysis- improving  Afib  Bipolar disorder/schizophrenia  H/o rt hip fracture s/p THA Nov 2022 Discussed the management with patient, his nurse and hospitalist

## 2022-05-22 NOTE — Significant Event (Signed)
Notified by radiology  MRI L spine: L2L3 discitis/osteomyelitis with associated epidural abscess Large left psoas abscess Will need interventional support IR consult for consideration of percutaneous drainage Hold Eliquis (last dose 11/23 AM) NPO after 12 Consider neurosurgery input as well  Ralene Muskrat MD  No charge

## 2022-05-22 NOTE — Progress Notes (Signed)
PT Cancellation Note  Patient Details Name: Pedro Mccoy MRN: [id number] DOB: 09/16/1950   Cancelled Treatment:    Reason Eval/Treat Not Completed: Other (comment) (Pt being seen by MD on attempt. WIll be going OTF for imaging studies. WIll attempt evaluation again at later date/time.)  1:46 PM, 05/22/22 Etta Grandchild, PT, DPT Physical Therapist - [facility]  [phone] ([place])    Shemika Robbs C 05/22/2022, 1:46 PM

## 2022-05-22 NOTE — Plan of Care (Signed)

## 2022-05-22 NOTE — Progress Notes (Signed)
PROGRESS NOTE    Pedro Mccoy  NUU:[id number] DOB: 04/26/1951 DOA: 05/21/2022 PCP: Romualdo Bolk, FNP    Brief Narrative:  71 year old male with past medical history of metastatic pancreatic cancer on chemotherapy, hypertension, diabetes mellitus, schizophrenia and tardive dyskinesia presented to the emergency room on 11/17 with complaints of weakness, fall x2 earlier that day and abdominal pain.  Work-up with septic shock secondary to pyelonephritis, acute versus subacute fracture of L3 vertebral body and rhabdomyolysis with CPK of 22,000.  Patient admitted to the hospitalist service and started on IV fluids and antibiotics.  By 11/19, urine cultures positive for MSSA.   Following admission, on the evening of 11/17, patient went to rapid atrial flutter.  Attempted treatment with metoprolol and IV fluids.  11/19, tachycardia persisting and patient started on Cardizem drip.  Heart rate starting to improve, and attempting to wean off of Cardizem drip. GTT weaned off 11/21.  Rates stable on PO cardizem and metop  11/23: White count continues to rise. No fevers. Unclear etiology   Assessment & Plan:   Principal Problem:   Rhabdomyolysis Active Problems:   Septic shock ([place])   UTI (urinary tract infection)   Acute pyelonephritis   Atrial flutter (HCC)   Acute urinary retention   Pancreatic cancer ([place])   HLD (hyperlipidemia)   Lumbar compression fracture, closed, initial encounter ([place])   HTN (hypertension)   Schizophrenia ([place])   Depression with anxiety   Diabetes mellitus without complication ([place])   Abdominal pain   Abnormal LFTs   Fall at home, initial encounter  Rhabdomyolysis Secondary to fall and some likely infectious component?  Responding well to IV fluids and down to 847.  Can stop trending   Septic shock (HCC) Meets criteria for septic shock on admission given urinary source with pyelonephritis seen on CT, lactic acid level of 4.3, white blood cell count of 16.8,  tachypnea and tachycardia.  Patient started on aggressive fluid resuscitation with IV fluids and antibiotics.  Blood culture no growth.  Urine culture with Staph aureus.  ID consulted. On ancef.  White count going up.  Question spine source.  Will get MRI T and L spine w and wo contrast     Acute pyelonephritis As above.  Urine cultures positive for MSSA.  Infectious disease consulted   Atrial flutter ([place]) On metoprolol plus as needed Lopressor.  Remains difficult to control.  Responded to Cardizem drip.  Drip weaned off.  Now on PO cardizem.  Continue PO eliquis   Acute urinary retention Likely due to volume shifts and episodes of hypotension.  Foley catheter placed as of 11/19.   Lumbar compression fracture, closed, initial encounter ([place]) Once rhabdomyolysis and sepsis stabilized, will ask IR to evaluate for kyphoplasty.  Possibly this could be related to metastases as well.  May be deferred to outpatient.   Pancreatic cancer ([place]) Metastatic mets to the liver.  Oncology following with recent chemotherapy.  Sees onc at [facility].  Next chemo scheduled for 11/30   HTN (hypertension) Blood pressures initially elevated, but since then dropped down.  Was on midodrine.  DC on 11/23   Schizophrenia ([place]) Continue home medications   Diabetes mellitus without complication (HCC) Sliding scale   Abdominal pain No evidence of pancreatitis or acute pancreatic obstruction.  Suspect may be secondary to referred pain from pyelonephritis versus compression fracture   Abnormal LFTs Mild, likely secondary to his liver metastases and also some shock liver from hypotension   Fall at home, initial encounter  Secondary to weakness, rhabdo and sepsis.  Physical therapy see as patient improves   DVT prophylaxis: Eliquis Code Status: FULL Family Communication:None today.  Attempted to call aunt Elba Barman. No answer Disposition Plan: Status is: Inpatient Remains inpatient appropriate because: Unsafe  dispo plan, needs therapy evaluations.  Possible dispo to SNF.   Level of care: Progressive  Consultants:  Palliative care  Procedures:  none  Antimicrobials: Ancef   Subjective: Seen and examined.  No specific complaints.  Poor historian  Objective: Vitals:   05/22/22 0014 05/22/22 0414 05/22/22 0718 05/22/22 1110  BP: (!) 144/83 128/79 117/79 115/89  Pulse: 90 93 (!) 107 85  Resp: 20 (!) 24 18   Temp: 99.6 F (37.6 C) 98.7 F (37.1 C) 98.4 F (36.9 C) 98.8 F (37.1 C)  TempSrc: Oral Oral    SpO2: 96% 95% 97% 95%  Weight:      Height:        Intake/Output Summary (Last 24 hours) at 05/22/2022 1243 Last data filed at 05/22/2022 0523 Gross per 24 hour  Intake 194.72 ml  Output 3075 ml  Net -2880.28 ml   Filed Weights   05/19/2022 1042  Weight: 82.1 kg    Examination:  General exam: NAD.  Appears lethargic Respiratory system: Clear to auscultation. Respiratory effort normal. Cardiovascular system: Reg rate, irreg rhythm, no murmur Gastrointestinal system: Soft, NTND, normal BS Central nervous system: Alert and oriented x 2. No focal neurological deficits. Extremities: Symmetric 5 x 5 power. Skin: No rashes, lesions or ulcers Psychiatry: Judgement and insight appear impaired. Mood & affect flattened.     Data Reviewed: I have personally reviewed following labs and imaging studies  CBC: Recent Labs  Lab 05/27/2022 1111 05/17/22 0457 05/18/22 0505 05/19/22 0623 05/20/22 0443 05/21/22 0340 05/22/22 0511  WBC 16.8*   < > 12.3* 14.7* 15.0* 16.7* 19.3*  NEUTROABS 15.3*  --   --   --   --   --   --   HGB 14.8   < > 11.9* 11.5* 12.0* 12.6* 12.6*  HCT 44.1   < > 33.7* 33.5* 34.4* 36.7* 36.7*  MCV 83.7   < > 80.6 82.1 82.1 81.2 82.1  PLT 155   < > 113* 115* 142* 185 230   < > = values in this interval not displayed.   Basic Metabolic Panel: Recent Labs  Lab 05/18/22 0505 05/19/22 0452 05/20/22 0443 05/21/22 0340 05/22/22 0511  NA 133* 135 137 137  140  K 3.0* 3.8 3.3* 2.8* 2.9*  CL 109 112* 112* 109 109  CO2 16* 14* 16* 21* 22  GLUCOSE 123* 174* 176* 175* 172*  BUN 25* '22 20 20 '$ 25*  CREATININE 0.90 0.85 0.89 0.83 0.94  CALCIUM 7.6* 7.9* 7.7* 7.9* 8.0*   GFR: Estimated Creatinine Clearance: 81.5 mL/min (by C-G formula based on SCr of 0.94 mg/dL). Liver Function Tests: Recent Labs  Lab 05/15/2022 1111 05/17/22 0457 05/18/22 0505 05/19/22 0452 05/20/22 0443  AST 389* 274* 130* 77* 49*  ALT 69* 67* 58* 50* 37  ALKPHOS 104 70 64 87 139*  BILITOT 1.2 1.2 1.0 0.8 1.2  PROT 6.8 5.3* 4.9* 5.0* 5.0*  ALBUMIN 3.8 2.8* 2.5* 2.3* 2.1*   Recent Labs  Lab 05/22/2022 1111  LIPASE 22   No results for input(s): "AMMONIA" in the last 168 hours. Coagulation Profile: Recent Labs  Lab 04/30/2022 1111  INR 1.3*   Cardiac Enzymes: Recent Labs  Lab 05/11/2022 1111 05/17/22 0457 05/18/22 0505  05/19/22 0452 05/20/22 0443  CKTOTAL 22,395* 11,093* 4,035* 1,835* 847*   BNP (last 3 results) No results for input(s): "PROBNP" in the last 8760 hours. HbA1C: No results for input(s): "HGBA1C" in the last 72 hours. CBG: Recent Labs  Lab 05/21/22 1152 05/21/22 1554 05/21/22 2104 05/22/22 0720 05/22/22 1111  GLUCAP 203* 198* 226* 207* 208*   Lipid Profile: Recent Labs    05/22/22 0511  CHOL 68  HDL <10*  LDLCALC NOT CALCULATED  TRIG 114  CHOLHDL NOT CALCULATED   Thyroid Function Tests: No results for input(s): "TSH", "T4TOTAL", "FREET4", "T3FREE", "THYROIDAB" in the last 72 hours. Anemia Panel: No results for input(s): "VITAMINB12", "FOLATE", "FERRITIN", "TIBC", "IRON", "RETICCTPCT" in the last 72 hours. Sepsis Labs: Recent Labs  Lab 05/29/2022 2127 05/17/22 0457 05/17/22 0918 05/17/22 1328 05/17/22 1517 05/18/22 0505 05/19/22 0452 05/20/22 0443 05/21/22 0340  PROCALCITON  --    < >  --   --   --  14.72 6.50 3.50 2.19  LATICACIDVEN 3.2*  --  2.3* 1.8 1.5  --   --   --   --    < > = values in this interval not  displayed.    Recent Results (from the past 240 hour(s))  Urine Culture     Status: Abnormal   Collection Time: 05/23/2022 12:57 PM   Specimen: Urine, Clean Catch  Result Value Ref Range Status   Specimen Description URINE, CLEAN CATCH  Final   Special Requests NONE  Final   Culture >=100,000 COLONIES/mL STAPHYLOCOCCUS AUREUS (A)  Final   Report Status 05/18/2022 FINAL  Final   Organism ID, Bacteria STAPHYLOCOCCUS AUREUS (A)  Final      Susceptibility   Staphylococcus aureus - MIC*    CIPROFLOXACIN <=0.5 SENSITIVE Sensitive     GENTAMICIN <=0.5 SENSITIVE Sensitive     NITROFURANTOIN <=16 SENSITIVE Sensitive     OXACILLIN <=0.25 SENSITIVE Sensitive     TETRACYCLINE <=1 SENSITIVE Sensitive     VANCOMYCIN <=0.5 SENSITIVE Sensitive     TRIMETH/SULFA <=10 SENSITIVE Sensitive     CLINDAMYCIN <=0.25 SENSITIVE Sensitive     RIFAMPIN <=0.5 SENSITIVE Sensitive     Inducible Clindamycin NEGATIVE Sensitive     * >=100,000 COLONIES/mL STAPHYLOCOCCUS AUREUS  Culture, blood (single)     Status: None   Collection Time: 05/06/2022  3:00 PM   Specimen: BLOOD  Result Value Ref Range Status   Specimen Description BLOOD BLOOD RIGHT ARM  Final   Special Requests   Final    BOTTLES DRAWN AEROBIC AND ANAEROBIC Blood Culture results may not be optimal due to an excessive volume of blood received in culture bottles   Culture   Final    NO GROWTH 5 DAYS Performed at [facility], [address] [place], [place] [address]    Report Status 05/21/2022 FINAL  Final         Radiology Studies: No results found.      Scheduled Meds:  apixaban  5 mg Oral BID   Chlorhexidine Gluconate Cloth  6 each Topical Daily   diltiazem  360 mg Oral Daily   FLUoxetine  40 mg Oral Daily   fluvoxaMINE  100 mg Oral BID   furosemide  20 mg Intravenous BID   insulin aspart  0-5 Units Subcutaneous QHS   insulin aspart  0-9 Units Subcutaneous TID WC   lamoTRIgine  200 mg Oral BID   lidocaine  1 patch  Transdermal Q24H   metoprolol succinate  100 mg Oral Daily   omega-3 acid ethyl esters  1 g Oral Daily   risperiDONE  3 mg Oral QHS   sodium bicarbonate  650 mg Oral BID   Continuous Infusions:   ceFAZolin (ANCEF) IV 2 g (05/22/22 0523)     LOS: 6 days     Sidney Ace, MD [facility]   If 7PM-7AM, please contact night-coverage  05/22/2022, 12:43 PM

## 2022-05-23 ENCOUNTER — Encounter: Payer: Self-pay | Admitting: Internal Medicine

## 2022-05-23 ENCOUNTER — Inpatient Hospital Stay: Payer: Medicare Other

## 2022-05-23 DIAGNOSIS — M6282 Rhabdomyolysis: Secondary | ICD-10-CM | POA: Diagnosis not present

## 2022-05-23 DIAGNOSIS — K6812 Psoas muscle abscess: Secondary | ICD-10-CM | POA: Diagnosis not present

## 2022-05-23 DIAGNOSIS — A419 Sepsis, unspecified organism: Secondary | ICD-10-CM | POA: Diagnosis not present

## 2022-05-23 DIAGNOSIS — G061 Intraspinal abscess and granuloma: Secondary | ICD-10-CM | POA: Diagnosis not present

## 2022-05-23 DIAGNOSIS — M4646 Discitis, unspecified, lumbar region: Secondary | ICD-10-CM | POA: Diagnosis not present

## 2022-05-23 DIAGNOSIS — T796XXA Traumatic ischemia of muscle, initial encounter: Secondary | ICD-10-CM | POA: Diagnosis not present

## 2022-05-23 DIAGNOSIS — R6521 Severe sepsis with septic shock: Secondary | ICD-10-CM | POA: Diagnosis not present

## 2022-05-23 DIAGNOSIS — N10 Acute tubulo-interstitial nephritis: Secondary | ICD-10-CM | POA: Diagnosis not present

## 2022-05-23 DIAGNOSIS — C259 Malignant neoplasm of pancreas, unspecified: Secondary | ICD-10-CM | POA: Diagnosis not present

## 2022-05-23 LAB — CBC
HCT: 37.2 % — ABNORMAL LOW (ref 39.0–52.0)
Hemoglobin: 12.6 g/dL — ABNORMAL LOW (ref 13.0–17.0)
MCH: 28.3 pg (ref 26.0–34.0)
MCHC: 33.9 g/dL (ref 30.0–36.0)
MCV: 83.4 fL (ref 80.0–100.0)
Platelets: 244 10*3/uL (ref 150–400)
RBC: 4.46 MIL/uL (ref 4.22–5.81)
RDW: 13.9 % (ref 11.5–15.5)
WBC: 20.2 10*3/uL — ABNORMAL HIGH (ref 4.0–10.5)
nRBC: 0 % (ref 0.0–0.2)

## 2022-05-23 LAB — BASIC METABOLIC PANEL
Anion gap: 10 (ref 5–15)
BUN: 28 mg/dL — ABNORMAL HIGH (ref 8–23)
CO2: 24 mmol/L (ref 22–32)
Calcium: 7.9 mg/dL — ABNORMAL LOW (ref 8.9–10.3)
Chloride: 107 mmol/L (ref 98–111)
Creatinine, Ser: 1.08 mg/dL (ref 0.61–1.24)
GFR, Estimated: >60 mL/min (ref >60)
Glucose, Bld: 161 mg/dL — ABNORMAL HIGH (ref 70–99)
Potassium: 2.9 mmol/L — ABNORMAL LOW (ref 3.5–5.1)
Sodium: 141 mmol/L (ref 135–145)

## 2022-05-23 LAB — GLUCOSE, CAPILLARY
Glucose-Capillary: 175 mg/dL — ABNORMAL HIGH (ref 70–99)
Glucose-Capillary: 160 mg/dL — ABNORMAL HIGH (ref 70–99)
Glucose-Capillary: 208 mg/dL — ABNORMAL HIGH (ref 70–99)
Glucose-Capillary: 175 mg/dL — ABNORMAL HIGH (ref 70–99)
Glucose-Capillary: 182 mg/dL — ABNORMAL HIGH (ref 70–99)

## 2022-05-23 MED ORDER — LIDOCAINE HCL (PF) 1 % IJ SOLN
8.0000 mL | Freq: Once | INTRAMUSCULAR | Status: AC
  Administered 2022-05-23: 8 mL

## 2022-05-23 MED ORDER — MIDAZOLAM HCL 2 MG/2ML IJ SOLN
INTRAMUSCULAR | Status: AC
  Filled 2022-05-23: qty 2

## 2022-05-23 MED ORDER — POLYETHYLENE GLYCOL 3350 17 G PO PACK
17.0000 g | PACK | Freq: Every day | ORAL | Status: DC
  Administered 2022-05-23 – 2022-05-31 (×8): 17 g via ORAL
  Filled 2022-05-23 (×8): qty 1

## 2022-05-23 MED ORDER — FENTANYL CITRATE (PF) 100 MCG/2ML IJ SOLN
INTRAMUSCULAR | Status: AC
  Filled 2022-05-23: qty 2

## 2022-05-23 MED ORDER — POTASSIUM CHLORIDE CRYS ER 20 MEQ PO TBCR
40.0000 meq | EXTENDED_RELEASE_TABLET | ORAL | Status: AC
  Administered 2022-05-23 (×2): 40 meq via ORAL
  Filled 2022-05-23 (×2): qty 2

## 2022-05-23 NOTE — Progress Notes (Signed)
Date of Admission:  05/11/2022      ID: Rogelio Waynick is a 71 y.o. male  Principal Problem:   Rhabdomyolysis Active Problems:   Schizophrenia ([place])   Fall at home, initial encounter   Pancreatic cancer ([place])   HTN (hypertension)   Abdominal pain   Abnormal LFTs   Depression with anxiety   HLD (hyperlipidemia)   Diabetes mellitus without complication ([place])   UTI (urinary tract infection)   Septic shock (HCC)   Acute pyelonephritis   Lumbar compression fracture, closed, initial encounter [facility])   Atrial flutter ([place])   Acute urinary retention   Lumbar discitis   Vertebral osteomyelitis ([place])   Psoas abscess, left (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   He presented to the ED on 05/12/2022 after a fall and initially refused to come to ED and later was brought in as he had fallen again and could not get off the floor  05/12/2022  BP 140/78 !  Temp 98.4 F (36.9 C)  Pulse Rate 93  Resp 18  SpO2 96 %  Weight 181 lb    Latest Reference Range & Units 05/22/2022  WBC 4.0 - 10.5 K/uL 16.8 (H)  Hemoglobin 13.0 - 17.0 g/dL 14.8  HCT 39.0 - 52.0 % 44.1  Platelets 150 - 400 K/uL 155  Creatinine 0.61 - 1.24 mg/dL 1.19  1 set blood culture NG UA 11-20 WBC CT abdomen Distended bladder Possible rt pyelo Has foley placed in the hospital UC MSSA Concern for MSSA disseminated infection VS bacteremia as MSSA is not a common organism to cause UTI IDP wanted MRI of the Lumbar spine    Subjective: Pt underwent IR procedure today- 10cc of pus aspirated from the psoas abscess left Says he is okay HE does  nto remember how he fell or what happened at home   Medications:   Chlorhexidine Gluconate Cloth  6 each Topical Daily   diltiazem  360 mg Oral Daily   FLUoxetine  40 mg Oral Daily   fluvoxaMINE  100 mg Oral BID   furosemide  20 mg Intravenous BID   insulin aspart  0-5 Units Subcutaneous QHS   insulin aspart  0-9 Units Subcutaneous TID WC   lamoTRIgine  200 mg Oral BID   lidocaine  1 patch Transdermal Q24H   metoprolol succinate  100 mg Oral Daily   omega-3 acid ethyl esters  1 g Oral Daily   polyethylene glycol  17 g Oral Daily   risperiDONE  3 mg Oral QHS    Objective: Vital signs in last 24 hours: Temp:  [98.2 F (36.8 C)-99.4 F (37.4 C)] 99.4 F (37.4 C) (11/24 1617) Pulse Rate:  [51-120] 97 (11/24 1617) Resp:  [16-22] 21 (11/24 1617) BP: (117-157)/(77-92) 124/84 (11/24 1617) SpO2:  [95 %-100 %] 96 % (11/24 1617) Weight:  [82 kg] 82 kg (11/24 0922)  LDA PORT- has not been accessed  PHYSICAL EXAM:  General: Awake, weak, responds appropriately to questions , some confusion intermittently  Left scalp carpet burn like injury .  Sacrum deep tissue injury  Back: sacrum- DTI Lungs:b/l  air entry Heart: irregular Abdomen: Soft, non-tender,not distended. Bowel sounds normal. No masses Extremities: atraumatic, no cyanosis. No edema. No clubbing Skin: as above Lymph: Cervical, supraclavicular normal. Neurologic: cannot assess in detail  Lab Results Recent Labs    05/22/22 0511 05/23/22 0419  WBC 19.3* 20.2*  HGB 12.6* 12.6*  HCT 36.7* 37.2*  NA 140 141  K 2.9* 2.9*  CL 109 107  CO2 22 24  BUN 25* 28*  CREATININE 0.94 1.08      Microbiology: One blood culture staph epi and staph hominis  Studies/Results:    Assessment/Plan: Metastatic pancreatic adenocarcinoma ( mets to liver) Has received first  dose of chemo on 04/24/22  Fall unclear etiology-Urinary retention MSSA in urine culture One blood culture has staph epidermidis and staph hominis-  likely contaminants?  but concern for disseminated MSSA infection MRI shows L2-L3  discitis and osteomyelitis of L2 and most of L3 Left psoas abscess Epidural abscess  L2-L4 Pathological Compression fracture  L3 Worsening leucocytosis IR aspirated the psoas abscess and culture sent  Sent repeat blood cultures PATIENT HAS PORT BUT HAS NOT BEEN ACCESSED DO NOT ACCESS PORT    Currently on cefazolin--but cannot take for granted that the lumbar spine infection is due to MSSA Need either a positive blood culture and or an epidural/psoas abscess culture for MSSA If we cannot get confirmation then may have to expand antibiotic coverage to vanco and ceftriaxone  Rhabdomyolysis- improving  Afib  Bipolar disorder/schizophrenia  H/o rt hip fracture s/p THA Nov 2022 Discussed the management with patient, his nurse and hospitalist ID will follow him peripherally this weekend- call if needed

## 2022-05-23 NOTE — Progress Notes (Signed)
[employer] paged regarding pt's TLSO brace. Spoke to [place].

## 2022-05-23 NOTE — Consult Note (Signed)
Chief Complaint: Patient was seen in consultation today for discitis/osteomyelitis of L2-L3 and left psoas abscess at the request of Clement Sayres, MD  Referring Physician(s): Clement Sayres, MD  Supervising Physician: Daryll Brod  Patient Status: [place] - In-pt  History of Present Illness: Pedro Mccoy is a 71 y.o. male with PMH significant for metastatic pancreatic cancer on chemotherapy, HTN, DM type II, schizophrenia and tardive dyskinesia presented to ED on 05/23/2022 complaining of weakness, fall x 2 earlier in the day and abdominal pain.  ED workup found patient to be septic secondary to pyelo with acute versus subacute fracture of L3 vertebral body and rhabdomyolysis.  During admission patient WBC continued to rise with no clear ideology.  Patient underwent MRI study that showed L2-L3 discitis and osteomyelitis with left psoas abscess.  Patient was referred to IR for drainage of left psoas abscess.  Dr. Annamaria Boots approved procedure.  Past Medical History:  Diagnosis Date   Hypertension    Hypertriglyceridemia .    Past Surgical History:  Procedure Laterality Date   NO PAST SURGERIES     TOTAL HIP ARTHROPLASTY Right 05/21/2021   Procedure: ANTERIOR HIP TOTAL ARTHROPLASTY;  Surgeon: Hessie Knows, MD;  Location: [facility] ORS;  Service: Orthopedics;  Laterality: Right;    Allergies: Ace inhibitors, Hydrochlorothiazide, Pioglitazone, Codeine, Sitagliptin, and Lithium  Medications: Prior to Admission medications   Medication Sig Start Date End Date Taking? Authorizing Provider  BELSOMRA 20 MG TABS Take 20 mg by mouth at bedtime.   Yes [provider]  FLUoxetine (PROZAC) 40 MG capsule Take 40 mg by mouth daily.   Yes [provider]  fluvoxaMINE (LUVOX) 100 MG tablet Take 100 mg by mouth 2 (two) times a day. 10/20/18  Yes [provider]  lamoTRIgine (LAMICTAL) 200 MG tablet Take 200 mg by mouth 2 (two) times a day. 10/20/18  Yes [provider]  LORazepam (ATIVAN) 1 MG tablet Take 1-1.5 mg by mouth daily as needed for anxiety. 10/20/18  Yes [provider]  metFORMIN (GLUCOPHAGE) 1000 MG tablet Take 1,000 mg by mouth 2 (two) times a day.   Yes [provider]  metoprolol succinate (TOPROL-XL) 50 MG 24 hr tablet Take 50 mg by mouth daily. Take with or immediately following a meal.   Yes [provider]  NIFEdipine (PROCARDIA XL/NIFEDICAL-XL) 90 MG 24 hr tablet Take 90 mg by mouth daily.   Yes [provider]  omega-3 acid ethyl esters (LOVAZA) 1 g capsule Take 1 g by mouth daily.   Yes [provider]  risperiDONE (RISPERDAL) 3 MG tablet Take 3 mg by mouth at bedtime.   Yes [provider]  rosuvastatin (CRESTOR) 20 MG tablet Take 20 mg by mouth daily.   Yes [provider]  apixaban (ELIQUIS) 2.5 MG TABS tablet Take 1 tablet (2.5 mg total) by mouth 2 (two) times daily for 13 days. Patient not taking: Reported on 05/06/2022 05/23/21 06/05/21  Lavina Hamman, MD  baclofen (LIORESAL) 10 MG tablet Take 1 tablet (10 mg total) by mouth 2 (two) times daily. Patient not taking: Reported on 05/03/2022 04/09/21   Lattie Corns, PA-C  benztropine (COGENTIN) 0.5 MG tablet Take 0.5 mg by mouth 2 (two) times a day. Patient not taking: Reported on 05/11/2022 10/20/18   [provider]  docusate sodium (COLACE) 100 MG capsule Take 1 capsule (100 mg total) by mouth 2 (two) times daily. Patient not taking: Reported on 05/14/2022 05/23/21   Berle Mull  M, MD  Eszopiclone 3 MG TABS Take 3 mg by mouth at bedtime. Patient not taking: Reported on 05/07/2022 10/20/18   [provider]  haloperidol (HALDOL) 5 MG tablet Take 2.5 mg by mouth daily. Patient not taking: Reported on 05/01/2022 10/20/18   [provider]  metoCLOPramide (REGLAN) 10 MG tablet Take 1 tablet (10 mg total) by mouth 3 (three) times daily before meals. Patient not taking: Reported  on 05/15/2022 04/09/21   Lattie Corns, PA-C  oxyCODONE-acetaminophen (PERCOCET/ROXICET) 5-325 MG tablet Take 1 tablet by mouth every 6 (six) hours as needed for moderate pain. Patient not taking: Reported on 05/09/2022 05/22/21   Duanne Guess, PA-C     Family History  Problem Relation Age of Onset   COPD Mother    Heart attack Father     Social History   Socioeconomic History   Marital status: Widowed    Spouse name: Not on file   Number of children: Not on file   Years of education: Not on file   Highest education level: Not on file  Occupational History   Not on file  Tobacco Use   Smoking status: Never   Smokeless tobacco: Never  Vaping Use   Vaping Use: Never used  Substance and Sexual Activity   Alcohol use: No   Drug use: No   Sexual activity: Not on file  Other Topics Concern   Not on file  Social History Narrative   Not on file   Social Determinants of Health   Financial Resource Strain: Not on file  Food Insecurity: No Food Insecurity (05/07/2022)   Hunger Vital Sign    Worried About Running Out of Food in the Last Year: Never true    Ran Out of Food in the Last Year: Never true  Transportation Needs: No Transportation Needs (05/19/2022)   PRAPARE - Hydrologist (Medical): No    Lack of Transportation (Non-Medical): No  Physical Activity: Not on file  Stress: Not on file  Social Connections: Not on file    Review of Systems: A 12 point ROS discussed and pertinent positives are indicated in the HPI above.  All other systems are negative.  Review of Systems  Unable to perform ROS: Mental status change    Vital Signs: BP 125/87 (BP Location: Right Arm)   Pulse (!) 109   Temp 98.2 F (36.8 C) (Oral)   Resp 20   Ht '6\' 1"'$  (1.854 m)   Wt 181 lb (82.1 kg)   SpO2 97%   BMI 23.88 kg/m     Physical Exam Vitals reviewed.  Constitutional:      General: He is not in acute distress.    Appearance: He is  ill-appearing.  HENT:     Head:     Comments: Abrasion in healing stage to left temple  Cardiovascular:     Rate and Rhythm: Regular rhythm. Tachycardia present.     Pulses: Normal pulses.     Heart sounds: Normal heart sounds. No murmur heard. Pulmonary:     Effort: Pulmonary effort is normal. No respiratory distress.     Breath sounds: Normal breath sounds.  Musculoskeletal:     Right lower leg: No edema.     Left lower leg: No edema.  Skin:    General: Skin is warm and dry.     Imaging: MR THORACIC SPINE W WO CONTRAST  Result Date: 05/22/2022 CLINICAL DATA:  Mid and lower back  pain. EXAM: MRI THORACIC AND LUMBAR SPINE WITHOUT AND WITH CONTRAST TECHNIQUE: Multiplanar and multiecho pulse sequences of the thoracic and lumbar spine were obtained without and with intravenous contrast. CONTRAST:  23m GADAVIST GADOBUTROL 1 MMOL/ML IV SOLN COMPARISON:  CT scan 05/13/2022 FINDINGS: MRI THORACIC SPINE FINDINGS Alignment:  Normal Vertebrae: Normal marrow signal. No bone lesions or fractures. Benign scattered hemangiomas are noted. Cord: Normal cord signal intensity. No cord lesions cord enhancement. Paraspinal and other soft tissues: Bilateral pleural effusions. Disc levels: No thoracic disc protrusions spinal or foraminal stenosis. No findings to suggest discitis or osteomyelitis. MRI LUMBAR SPINE FINDINGS Segmentation: There are five lumbar type vertebral bodies. The last full intervertebral disc space is labeled L5-S1. Alignment:  Normal Vertebrae: Remote L1 compression fracture. Benign L2 hemangioma. Discitis at L2-3 with osteomyelitis involving L2 and L3. Associated epidural abscess extending from the midbody of L2 down to the bottom of L4. Conus medullaris: Extends to the T12-L1 level and appears normal. Paraspinal and other soft tissues: Large complex left psoas abscess extending from the upper pelvis to the L2 level. Disc levels: T12-L1: Mild retropulsion L1 but no canal compromise. Mild  bilateral lateral recess encroachment. L2-3: Mild to moderate facet disease bulging annulus with bilateral lateral recess stenosis. L2-3: Discitis with a fluid-filled disc space and associated osteomyelitis involving the inferior aspect of L2 and most of the L3 vertebral body which demonstrates a pathologic compression fracture. Associated moderate-sized epidural abscess which is creating mass effect on the ventral thecal sac. L3-4: Bulging degenerated annulus epidural abscess and facet disease contributing to moderate to moderately severe spinal and bilateral lateral recess stenosis. L4-5: Broad-based bulging annulus and central disc protrusion in combination with facet disease and ligamentum flavum thickening contributing to moderate spinal and bilateral lateral recess stenosis. There is also mild bilateral foraminal stenosis. L5-S1: Central disc protrusion and facet disease with mass effect the ventral thecal sac and possible irritation of both S1 nerve roots right greater than left. Mild bilateral foraminal stenosis. IMPRESSION: 1. 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 Mild pathologic compression fracture of L3. Normal normal associated epidural abscess extending from the midbody of L2 down to the bottom of L4. 2. Large complex left psoas abscess extending from the upper pelvis to the L2 level. 3. Remote L1 compression fracture. 4. No findings for discitis or osteomyelitis in the thoracic spine 5. Mild bilateral lateral recess encroachment at T12-L1 and L1-2. 6. 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 These results will be called to the ordering clinician or representative by the Radiologist Assistant, and communication documented in the PACS or [employer] Electronically Signed   By: PMarijo SanesM.D.   On: 05/22/2022 17:08   MR Lumbar Spine W Wo Contrast  Result Date:  05/22/2022 CLINICAL DATA:  Mid and lower back pain. EXAM: MRI THORACIC AND LUMBAR SPINE WITHOUT AND WITH CONTRAST TECHNIQUE: Multiplanar and multiecho pulse sequences of the thoracic and lumbar spine were obtained without and with intravenous contrast. CONTRAST:  862mGADAVIST GADOBUTROL 1 MMOL/ML IV SOLN COMPARISON:  CT scan 05/06/2022 FINDINGS: MRI THORACIC SPINE FINDINGS Alignment:  Normal Vertebrae: Normal marrow signal. No bone lesions or fractures. Benign scattered hemangiomas are noted. Cord: Normal cord signal intensity. No cord lesions cord enhancement. Paraspinal and other soft tissues: Bilateral pleural effusions. Disc levels: No thoracic disc protrusions spinal or foraminal stenosis. No findings to suggest discitis or osteomyelitis. MRI LUMBAR  SPINE FINDINGS Segmentation: There are five lumbar type vertebral bodies. The last full intervertebral disc space is labeled L5-S1. Alignment:  Normal Vertebrae: Remote L1 compression fracture. Benign L2 hemangioma. Discitis at L2-3 with osteomyelitis involving L2 and L3. Associated epidural abscess extending from the midbody of L2 down to the bottom of L4. Conus medullaris: Extends to the T12-L1 level and appears normal. Paraspinal and other soft tissues: Large complex left psoas abscess extending from the upper pelvis to the L2 level. Disc levels: T12-L1: Mild retropulsion L1 but no canal compromise. Mild bilateral lateral recess encroachment. L2-3: Mild to moderate facet disease bulging annulus with bilateral lateral recess stenosis. L2-3: Discitis with a fluid-filled disc space and associated osteomyelitis involving the inferior aspect of L2 and most of the L3 vertebral body which demonstrates a pathologic compression fracture. Associated moderate-sized epidural abscess which is creating mass effect on the ventral thecal sac. L3-4: Bulging degenerated annulus epidural abscess and facet disease contributing to moderate to moderately severe spinal and bilateral  lateral recess stenosis. L4-5: Broad-based bulging annulus and central disc protrusion in combination with facet disease and ligamentum flavum thickening contributing to moderate spinal and bilateral lateral recess stenosis. There is also mild bilateral foraminal stenosis. L5-S1: Central disc protrusion and facet disease with mass effect the ventral thecal sac and possible irritation of both S1 nerve roots right greater than left. Mild bilateral foraminal stenosis. IMPRESSION: 1. 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 Mild pathologic compression fracture of L3. Normal normal associated epidural abscess extending from the midbody of L2 down to the bottom of L4. 2. Large complex left psoas abscess extending from the upper pelvis to the L2 level. 3. Remote L1 compression fracture. 4. No findings for discitis or osteomyelitis in the thoracic spine 5. Mild bilateral lateral recess encroachment at T12-L1 and L1-2. 6. 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 These results will be called to the ordering clinician or representative by the Radiologist Assistant, and communication documented in the PACS or [employer]. Electronically Signed   By: Marijo Sanes M.D.   On: 05/22/2022 17:08   DG Lumbar Spine 2-3 Views  Result Date: 04/30/2022 CLINICAL DATA:  Fall EXAM: LUMBAR SPINE - 2-3 VIEW COMPARISON:  CT 05/01/2022 FINDINGS: There are 5 non-rib-bearing lumbar vertebrae. There is a chronic compression fracture of L1 and and acute-subacute compression fracture of L3. There is multilevel degenerative disc disease, worst at L4-L5 and L5-S1 with moderate and severe disc height loss respectively. There is moderate lower lumbar predominant facet arthropathy. Partially visualized right hip arthroplasty. IMPRESSION: Acute-subacute compression fracture of L3 with up to 50% height loss. Chronic compression  deformity of L1 without evidence of progressive height loss. Multilevel degenerative disc disease, moderate at L4-L5 and severe at L5-S1. Moderate lower lumbar predominant facet arthropathy. Electronically Signed   By: Maurine Simmering M.D.   On: 05/09/2022 17:09   CT ABDOMEN PELVIS W CONTRAST  Result Date: 05/18/2022 CLINICAL DATA:  Pancreatic cancer. Blunt abdominal trauma. Patient found down on floor incontinent to urine. Recent diagnosis of sepsis. * Tracking Code: BO * EXAM: CT ABDOMEN AND PELVIS WITH CONTRAST TECHNIQUE: Multidetector CT imaging of the abdomen and pelvis was performed using the standard protocol following bolus administration of intravenous contrast. RADIATION DOSE REDUCTION: This exam was performed according to the departmental dose-optimization program which includes automated exposure control, adjustment of the mA and/or kV according to patient size  and/or use of iterative reconstruction technique. CONTRAST:  144m OMNIPAQUE IOHEXOL 300 MG/ML  SOLN COMPARISON:  Radiographs from 05/17/2022 and CT scan 05/20/2021 FINDINGS: Lower chest: Mild dependent atelectasis or scarring. Hepatobiliary: 4 new hypodense lesions in the right hepatic lobe are identified, probably metastatic lesions. The largest measures 1.9 by 1.6 cm in segment 7 of the liver (image 14, series 2). Gallbladder unremarkable. No biliary dilatation. Pancreas: Substantial enlargement of the hypoenhancing infiltrative pancreatic mass, currently 6.6 by 4.2 cm on image 25 of series 2 and previously about 2.7 by 2.0 cm on 05/20/2021. This mass abuts portions of the splenic artery, the junction of the SMV and the portal vein, and nearly occludes the splenic vein as shown for example on image 49 series 5. Spleen: Trace perisplenic ascites, otherwise unremarkable. Adrenals/Urinary Tract: Both adrenal glands appear normal. Stable small renal cysts warrant no further follow up. Subtle parenchymal hypoenhancement posteriorly in the right  kidney upper pole on image 32 series 2, correlate with urine analysis in assessing for possible pyelonephritis. Distended urinary bladder. Stomach/Bowel: Unremarkable Vascular/Lymphatic: Vascular involvement of the enlarging pancreatic mass as noted above. Atherosclerosis is present, including aortoiliac atherosclerotic disease. Reproductive: Prostatomegaly. Other: No supplemental non-categorized findings. Musculoskeletal: Right total hip prosthesis noted. Acute or early subacute fracture of the L3 vertebral body with involvement of the superior endplate and extension to the middle column, with 1-2 mm posterior bony retropulsion. We demonstrate nearly 50% loss of vertebral body height and some mild anterior fragmentation of the vertebral rim along with paraspinal edema/mild paraspinal hematoma. There is also a chronic compression fracture at the L4 vertebral level. Multilevel lumbar spondylosis and degenerative disc disease leading to multilevel impingement. Small umbilical hernia containing adipose tissue. Somewhat prominent piriformis and obturator internus musculature in the pelvis, left greater than right, cannot exclude mild sciatic notch impingement due to the left piriformis hypertrophy. IMPRESSION: 1. Substantial enlargement of the hypoenhancing infiltrative pancreatic mass, currently 6.6 by 4.2 cm, previously about 2.7 by 2.0 cm on 05/20/2021. This mass abuts portions of the splenic artery, the junction of the SMV and portal vein, and nearly occludes the splenic vein. 2. 4 new hypodense lesions in the right hepatic lobe are probably metastatic lesions. 3. Acute or early subacute fracture of the L3 vertebral body with involvement of the superior endplate and extension to the middle column, with nearly 50% loss of vertebral body height and some mild anterior fragmentation of the vertebral rim. 1-2 mm posterior bony retropulsion. Paraspinal edema noted. 4. Subtle parenchymal hypoenhancement posteriorly in the  right kidney upper pole, correlate with urine analysis in assessing for possible pyelonephritis. 5. Distended urinary bladder. 6. Prostatomegaly. 7. Somewhat prominent piriformis and obturator internus musculature in the pelvis, left greater than right, cannot exclude left sciatic notch impingement due to the left piriformis hypertrophy. 8. Multilevel lumbar spondylosis and degenerative disc disease leading to multilevel impingement. 9. Small umbilical hernia containing adipose tissue. 10. Aortic atherosclerosis. Aortic Atherosclerosis (ICD10-I70.0). Electronically Signed   By: WVan ClinesM.D.   On: 05/21/2022 16:41   DG Hip Unilat W or Wo Pelvis 2-3 Views Left  Result Date: 05/22/2022 CLINICAL DATA:  Fall.  Evaluate for fracture. EXAM: DG HIP (WITH OR WITHOUT PELVIS) 2-3V LEFT COMPARISON:  Right hip radiographs 05/21/2021, pelvis and right hip radiographs 05/20/2021 FINDINGS: Status post total right hip arthroplasty. There is diffuse decreased bone mineralization. No perihardware lucency is seen to indicate radiographic evidence of hardware failure. Mild superolateral left acetabular degenerative osteophytosis. The bilateral sacroiliac, left  femoroacetabular, and pubic symphysis joint spaces are maintained. Small sclerotic focus within the left ilium is unchanged from 05/20/2021, compatible with a benign bone island. No acute fracture is seen.  No dislocation. Moderate to severe right L4-5 disc space narrowing and right lateral endplate osteophytes. 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 No acute fracture is seen. Electronically Signed   By: Yvonne Kendall M.D.   On: 05/27/2022 11:32   DG Chest Port 1 View  Result Date: 04/30/2022 CLINICAL DATA:  Sepsis EXAM: PORTABLE CHEST 1 VIEW COMPARISON:  05/20/2021 FINDINGS: Right IJ approach chest port in place with distal tip at the level of the distal SVC. The heart size and mediastinal contours are  within normal limits. Aortic atherosclerosis. Both lungs are clear. The visualized skeletal structures are unremarkable. IMPRESSION: No active disease. Electronically Signed   By: Davina Poke D.O.   On: 05/03/2022 11:30   DG Shoulder Left  Result Date: 05/20/2022 CLINICAL DATA:  Fall.  Evaluate for fracture. EXAM: LEFT SHOULDER - 2+ VIEW COMPARISON:  None Available. FINDINGS: Severe acromioclavicular joint space narrowing with mild-to-moderate peripheral degenerative osteophytes. Mild glenohumeral joint space narrowing. Mild peripheral glenoid degenerative osteophytosis. No acute fracture is seen. No dislocation. The visualized portion of the left lung is unremarkable. IMPRESSION: 1. No acute fracture. 2. Moderate acromioclavicular and mild glenohumeral osteoarthritis. Electronically Signed   By: Yvonne Kendall M.D.   On: 05/12/2022 11:29   CT HEAD WO CONTRAST (5MM)  Result Date: 05/06/2022 CLINICAL DATA:  Head trauma, minor (Age >= 65y); Neck trauma (Age >= 65y) EXAM: CT HEAD WITHOUT CONTRAST CT CERVICAL SPINE WITHOUT CONTRAST TECHNIQUE: Multidetector CT imaging of the head and cervical spine was performed following the standard protocol without intravenous contrast. Multiplanar CT image reconstructions of the cervical spine were also generated. RADIATION DOSE REDUCTION: This exam was performed according to the departmental dose-optimization program which includes automated exposure control, adjustment of the mA and/or kV according to patient size and/or use of iterative reconstruction technique. COMPARISON:  None Available. FINDINGS: CT HEAD FINDINGS Brain: No evidence of acute infarction, hemorrhage, hydrocephalus, extra-axial collection or mass lesion/mass effect. Vascular: No hyperdense vessel identified. Skull: No acute fracture.  Left forehead contusion. Sinuses/Orbits: Paranasal sinus mucosal thickening. Other: No mastoid effusions. CT CERVICAL SPINE FINDINGS Alignment: Straightening.  No  substantial sagittal subluxation. Skull base and vertebrae: Vertebral heights are maintained. No evidence of acute fracture. Soft tissues and spinal canal: No prevertebral fluid or swelling. No visible canal hematoma. Disc levels: Multilevel facet and uncovertebral hypertrophy there degrees neural foraminal stenosis. Multilevel degenerative disease. Upper chest: Visualized lung apices are clear. IMPRESSION: 1. No evidence of acute intracranial abnormality. 2. No evidence of acute fracture or traumatic malalignment in the cervical spine. Electronically Signed   By: Margaretha Sheffield M.D.   On: 05/07/2022 11:23   CT Cervical Spine Wo Contrast  Result Date: 05/24/2022 CLINICAL DATA:  Head trauma, minor (Age >= 65y); Neck trauma (Age >= 65y) EXAM: CT HEAD WITHOUT CONTRAST CT CERVICAL SPINE WITHOUT CONTRAST TECHNIQUE: Multidetector CT imaging of the head and cervical spine was performed following the standard protocol without intravenous contrast. Multiplanar CT image reconstructions of the cervical spine were also generated. RADIATION DOSE REDUCTION: This exam was performed according to the departmental dose-optimization program which includes automated exposure control, adjustment of the mA and/or kV according to patient size and/or use of iterative reconstruction technique. COMPARISON:  None Available. FINDINGS: CT HEAD FINDINGS Brain: No evidence of acute  infarction, hemorrhage, hydrocephalus, extra-axial collection or mass lesion/mass effect. Vascular: No hyperdense vessel identified. Skull: No acute fracture.  Left forehead contusion. Sinuses/Orbits: Paranasal sinus mucosal thickening. Other: No mastoid effusions. CT CERVICAL SPINE FINDINGS Alignment: Straightening.  No substantial sagittal subluxation. Skull base and vertebrae: Vertebral heights are maintained. No evidence of acute fracture. Soft tissues and spinal canal: No prevertebral fluid or swelling. No visible canal hematoma. Disc levels: Multilevel  facet and uncovertebral hypertrophy there degrees neural foraminal stenosis. Multilevel degenerative disease. Upper chest: Visualized lung apices are clear. IMPRESSION: 1. No evidence of acute intracranial abnormality. 2. No evidence of acute fracture or traumatic malalignment in the cervical spine. Electronically Signed   By: Margaretha Sheffield M.D.   On: 05/12/2022 11:23    Labs:  CBC: Recent Labs    05/20/22 0443 05/21/22 0340 05/22/22 0511 05/23/22 0419  WBC 15.0* 16.7* 19.3* 20.2*  HGB 12.0* 12.6* 12.6* 12.6*  HCT 34.4* 36.7* 36.7* 37.2*  PLT 142* 185 230 244    COAGS: Recent Labs    05/17/2022 1111  INR 1.3*  APTT 34    BMP: Recent Labs    05/20/22 0443 05/21/22 0340 05/22/22 0511 05/23/22 0419  NA 137 137 140 141  K 3.3* 2.8* 2.9* 2.9*  CL 112* 109 109 107  CO2 16* 21* 22 24  GLUCOSE 176* 175* 172* 161*  BUN 20 20 25* 28*  CALCIUM 7.7* 7.9* 8.0* 7.9*  CREATININE 0.89 0.83 0.94 1.08  GFRNONAA >60 >60 >60 >60    LIVER FUNCTION TESTS: Recent Labs    05/17/22 0457 05/18/22 0505 05/19/22 0452 05/20/22 [id number]  BILITOT 1.2 1.0 0.8 1.2  AST 274* 130* 77* 49*  ALT 67* 58* 50* 37  ALKPHOS 70 64 87 139*  PROT 5.3* 4.9* 5.0* 5.0*  ALBUMIN 2.8* 2.5* 2.3* 2.1*    TUMOR MARKERS: No results for input(s): "AFPTM", "CEA", "CA199", "CHROMGRNA" in the last 8760 hours.  Assessment and Plan: 71 year old male presents to IR for left psoas abscess drainage with possible drain placement.  Pt sleeping in bed. He is lethargic and awakens to verbal stimuli and falls right back to sleep.  He is in no distress.  RN states pt last ate yesterday.  Pt sister, Pedro Mccoy, gave telephone consent to procedure. Last Eliquis was yesterday.  Risks and benefits of aspiration/drain placement of left psoas abscess with moderate sedation discussed with the patient's family including bleeding, infection, damage to adjacent structures, bowel perforation/fistula connection, and  sepsis.  All of the patient's questions were answered, patient is agreeable to proceed. Consent signed and in chart.  Thank you for this interesting consult.  I greatly enjoyed meeting Pedro Mccoy and look forward to participating in their care.  A copy of this report was sent to the requesting provider on this date.  Electronically Signed: Tyson Alias, NP 05/23/2022, 8:28 AM   I spent a total of 20 minutes in face to face in clinical consultation, greater than 50% of which was counseling/coordinating care for discitis/osteomyelitis of L2-L3 and left psoas abscess.

## 2022-05-23 NOTE — Progress Notes (Addendum)
Palliative Care Progress Note, Assessment & Plan   Patient Name: Pedro Mccoy       Date: 05/23/2022 DOB: 1951/03/07  Age: 71 y.o. MRN#: [id number] Attending Physician: Sidney Ace, MD Primary Care Physician: Romualdo Bolk, FNP Admit Date: 05/21/2022  Reason for Consultation/Follow-up: Establishing goals of care  Subjective: Patient is lying in bed in no apparent distress.  He acknowledges my presence and is able to make his wishes known.  He is oriented to self and place.  He has no acute complaints of pain.  No family present at bedside.  HPI: 71 y.o. male  with past medical history of prostate cancer with known metastasis to liver, HTN, type 2 diabetes, schizophrenia (visual hallucinations), tardive dyskinesia, and right total hip replacement admitted on 05/15/2022 with weakness and falls (X2) at home.   Patient is being treated for septic shock secondary to pyelonephritis as well as L3 vertebral fracture, rhabdomyolysis, and urine cultures positive for MSSA.   On 11/19, patient became tachycardic, failed to respond to metoprolol and IVF, and was placed on Cardizem gtt. He has since been converted to oral cardizem.   On 11/23, WBC levels continue to rise. MRI of spine revealed L2/L3 discitis/osteomyelitis with associated epidural large left psoas abscess. Pt was placed in NPO status, Eliquis held, and IR consulted for consideration of percutaneous drain.   On 11/24, pt had CT with aspiration of left psoas abscess without complication.    PMT was consulted to discuss goals of care.  Summary of counseling/coordination of care: After reviewing the patient's chart and assessing the patient at bedside, I spoke with patient in regard to diagnosis and GOC. Patient is more alert today than  previous visits. I asked his understanding of his current medical situation. Patient states he has a "infection in my back". We discussed that sample of abscess was taken to see what antibiotics/treatments will be appropriate for him. He denies pain/discomfort at this time.   I attempted to elicit goals and values important to the patient.  We discussed if patient had ever documented or considered advanced care planning.  Patient shares that he would like to have a living will and asked about this and that can be completed at the hospital.  We discussed that healthcare power of attorney/advanced directives can be completed while hospitalized.    When asked who would make decisions for him in the event patient is unable to speak for himself, patient verbalized he would want his sister Pedro Mccoy to make decisions for him.  We discussed making his wishes clear in an advance care planning document in order to clearly outline his wishes in the event that Pedro Mccoy would need to speak for him.  Patient was very interested in completing this documentation.  Spiritual care consult placed.  I attempted to ask patient what he is thoughts are on artificial hydration and nutrition, CODE STATUS, and rehospitalization.  Patient began to nod off and appear to fall back to sleep.  He is easily aroused but he is very sleepy.  I shared that PMT will continue to follow him and have ongoing conversations regarding the goals of his care.  He was appreciative of my visit.  I attempted to speak with patient's Sister Pedro Mccoy over the phone.  No answer.  HIPAA appropriate voicemail left.  Addendum -  Received message that patient's sister had returned my call. I attempted to call her back. Again, no answer.   PMT will remain available to patient and family.  Please note that there is no in person PMT provider over the weekend.  Physical Exam Vitals reviewed.  Constitutional:      Appearance: Normal appearance.  HENT:      Mouth/Throat:     Mouth: Mucous membranes are moist.  Eyes:     Pupils: Pupils are equal, round, and reactive to light.  Cardiovascular:     Rate and Rhythm: Normal rate.     Pulses: Normal pulses.  Pulmonary:     Effort: Pulmonary effort is normal.  Abdominal:     Palpations: Abdomen is soft.  Musculoskeletal:     Comments: MAETC, generalized weakness  Neurological:     Mental Status: He is alert.     Comments: Oriented to self, place, and situation  Psychiatric:        Mood and Affect: Mood normal.        Behavior: Behavior normal.        Thought Content: Thought content normal.        Judgment: Judgment normal.             Palliative Assessment/Data: 40%    Total Time 35 minutes  Greater than 50%  of this time was spent counseling and coordinating care related to the above assessment and plan.  Thank you for allowing the Palliative Medicine Team to assist in the care of this patient.  Denton Ilsa Iha, FNP-BC Palliative Medicine Team Team Phone # [phone]

## 2022-05-23 NOTE — Progress Notes (Signed)
PROGRESS NOTE    Pedro Mccoy  GBT:[id number] DOB: December 10, 1950 DOA: 05/14/2022 PCP: Romualdo Bolk, FNP    Brief Narrative:  71 year old male with past medical history of metastatic pancreatic cancer on chemotherapy, hypertension, diabetes mellitus, schizophrenia and tardive dyskinesia presented to the emergency room on 11/17 with complaints of weakness, fall x2 earlier that day and abdominal pain.  Work-up with septic shock secondary to pyelonephritis, acute versus subacute fracture of L3 vertebral body and rhabdomyolysis with CPK of 22,000.  Patient admitted to the hospitalist service and started on IV fluids and antibiotics.  By 11/19, urine cultures positive for MSSA.   Following admission, on the evening of 11/17, patient went to rapid atrial flutter.  Attempted treatment with metoprolol and IV fluids.  11/19, tachycardia persisting and patient started on Cardizem drip.  Heart rate starting to improve, and attempting to wean off of Cardizem drip. GTT weaned off 11/21.  Rates stable on PO cardizem and metop  11/23: White count continues to rise. No fevers. Unclear etiology 11/24: L2L3 discitis noted, epidural abscess. Large psoas abscess.  Sent to IR this morning, psoas abscess drained   Assessment & Plan:   Principal Problem:   Rhabdomyolysis Active Problems:   Septic shock ([place])   UTI (urinary tract infection)   Acute pyelonephritis   Atrial flutter (HCC)   Acute urinary retention   Pancreatic cancer ([place])   HLD (hyperlipidemia)   Lumbar compression fracture, closed, initial encounter ([place])   HTN (hypertension)   Schizophrenia ([place])   Depression with anxiety   Diabetes mellitus without complication ([place])   Abdominal pain   Abnormal LFTs   Fall at home, initial encounter   Lumbar discitis   Vertebral osteomyelitis ([place])   Psoas abscess, left ([place])   Spinal epidural abscess  Rhabdomyolysis Secondary to fall and some likely infectious component?  Responding well to IV  fluids and down to 847.  Can stop trending   Septic shock (HCC) L2L3 osteomyelitis with epidural abscess Left psoas abscess Meets criteria for septic shock on admission given urinary source with pyelonephritis seen on CT, lactic acid level of 4.3, white blood cell count of 16.8, tachypnea and tachycardia.   Patient started on aggressive fluid resuscitation with IV fluids and antibiotics.   Blood culture no growth.   Urine culture with Staph aureus.   ID consulted.  MRI spine with osteo/abscess Drained by IR on 11/24 Plan: Continue Ancef DO NOT access port Follow repeat blood cx and abscess cultures Daily labs Vitals and fever curve ID following    Acute pyelonephritis As above.  Urine cultures positive for MSSA.  Infectious disease consulted   Atrial flutter ([place]) On metoprolol plus as needed Lopressor.  Remains difficult to control.  Responded to Cardizem drip.  Drip weaned off.  Now on PO cardizem.  Continue PO eliquis (dose held on 11/24 for procedure.  If labs stable will restart 11/25)   Acute urinary retention Likely due to volume shifts and episodes of hypotension.  Foley catheter placed as of 11/19.   Lumbar compression fracture, closed, initial encounter ([place]) Once rhabdomyolysis and sepsis stabilized, will ask IR to evaluate for kyphoplasty.  Possibly this could be related to metastases as well.  May be deferred to outpatient.   Pancreatic cancer ([place]) Metastatic mets to the liver.  Oncology following with recent chemotherapy.  Se[facility] onc at [facility].  Next chemo scheduled for 11/30   HTN (hypertension) Blood pressures initially elevated, but since then dropped down.  Was on  midodrine.  DC on 11/23   Schizophrenia ([place]) Continue home medications   Diabetes mellitus without complication (HCC) Sliding scale   Abdominal pain No evidence of pancreatitis or acute pancreatic obstruction.  Suspect may be secondary to referred pain from pyelonephritis versus compression  fracture   Abnormal LFTs Mild, likely secondary to his liver metastases and also some shock liver from hypotension   Fall at home, initial encounter Secondary to weakness, rhabdo and sepsis.  Physical therapy see as patient improves   DVT prophylaxis: Eliquis Code Status: FULL Family Communication:None today.  Attempted to call aunt Elba Barman. No answer Disposition Plan: Status is: Inpatient Remains inpatient appropriate because: Unsafe dispo plan, needs therapy evaluations.  Possible dispo to SNF.   Level of care: Progressive  Consultants:  Palliative care  Procedures:  none  Antimicrobials: Ancef   Subjective: Seen and examined.  No specific complaints.  Poor historian  Objective: Vitals:   05/23/22 0953 05/23/22 1000 05/23/22 1100 05/23/22 1159  BP: (!) 141/87 (!) 144/87 132/77 121/83  Pulse: (!) 120 (!) 110 98 96  Resp: (!) 22 (!) '22 20 20  '$ Temp:   98.7 F (37.1 C) 98.6 F (37 C)  TempSrc:    Oral  SpO2: 98% 98% 100% 97%  Weight:      Height:        Intake/Output Summary (Last 24 hours) at 05/23/2022 1348 Last data filed at 05/23/2022 0955 Gross per 24 hour  Intake --  Output 1900 ml  Net -1900 ml   Filed Weights   05/01/2022 1042 05/23/22 0922  Weight: 82.1 kg 82 kg    Examination:  General exam: NAD. Respiratory system: Clear to auscultation. Respiratory effort normal. Cardiovascular system: Reg rate, irreg rhythm, no murmur Gastrointestinal system: Soft, NTND, normal BS Central nervous system: Alert and oriented x 2. No focal neurological deficits. Extremities: Decreased power LLE Skin: No rashes, lesions or ulcers Psychiatry: Judgement and insight appear impaired. Mood & affect flattened.     Data Reviewed: I have personally reviewed following labs and imaging studies  CBC: Recent Labs  Lab 05/19/22 0623 05/20/22 0443 05/21/22 0340 05/22/22 0511 05/23/22 0419  WBC 14.7* 15.0* 16.7* 19.3* 20.2*  HGB 11.5* 12.0* 12.6* 12.6*  12.6*  HCT 33.5* 34.4* 36.7* 36.7* 37.2*  MCV 82.1 82.1 81.2 82.1 83.4  PLT 115* 142* 185 230 299   Basic Metabolic Panel: Recent Labs  Lab 05/19/22 0452 05/20/22 0443 05/21/22 0340 05/22/22 0511 05/23/22 0419  NA 135 137 137 140 141  K 3.8 3.3* 2.8* 2.9* 2.9*  CL 112* 112* 109 109 107  CO2 14* 16* 21* 22 24  GLUCOSE 174* 176* 175* 172* 161*  BUN '22 20 20 '$ 25* 28*  CREATININE 0.85 0.89 0.83 0.94 1.08  CALCIUM 7.9* 7.7* 7.9* 8.0* 7.9*   GFR: Estimated Creatinine Clearance: 70.9 mL/min (by C-G formula based on SCr of 1.08 mg/dL). Liver Function Tests: Recent Labs  Lab 05/17/22 0457 05/18/22 0505 05/19/22 0452 05/20/22 0443  AST 274* 130* 77* 49*  ALT 67* 58* 50* 37  ALKPHOS 70 64 87 139*  BILITOT 1.2 1.0 0.8 1.2  PROT 5.3* 4.9* 5.0* 5.0*  ALBUMIN 2.8* 2.5* 2.3* 2.1*   No results for input(s): "LIPASE", "AMYLASE" in the last 168 hours.  No results for input(s): "AMMONIA" in the last 168 hours. Coagulation Profile: No results for input(s): "INR", "PROTIME" in the last 168 hours.  Cardiac Enzymes: Recent Labs  Lab 05/17/22 0457 05/18/22 0505 05/19/22 0452 05/20/22  [place]* 4,035* 1,835* 847*   BNP (last 3 results) No results for input(s): "PROBNP" in the last 8760 hours. HbA1C: No results for input(s): "HGBA1C" in the last 72 hours. CBG: Recent Labs  Lab 05/22/22 1705 05/22/22 2043 05/23/22 0530 05/23/22 0742 05/23/22 1157  GLUCAP 184* 153* 175* 160* 208*   Lipid Profile: Recent Labs    05/22/22 0511  CHOL 68  HDL <10*  LDLCALC NOT CALCULATED  TRIG 114  CHOLHDL NOT CALCULATED   Thyroid Function Tests: No results for input(s): "TSH", "T4TOTAL", "FREET4", "T3FREE", "THYROIDAB" in the last 72 hours. Anemia Panel: No results for input(s): "VITAMINB12", "FOLATE", "FERRITIN", "TIBC", "IRON", "RETICCTPCT" in the last 72 hours. Sepsis Labs: Recent Labs  Lab 05/14/2022 2127 05/17/22 0457 05/17/22 0918 05/17/22 1328 05/17/22 1517  05/18/22 0505 05/19/22 0452 05/20/22 0443 05/21/22 0340  PROCALCITON  --    < >  --   --   --  14.72 6.50 3.50 2.19  LATICACIDVEN 3.2*  --  2.3* 1.8 1.5  --   --   --   --    < > = values in this interval not displayed.    Recent Results (from the past 240 hour(s))  Urine Culture     Status: Abnormal   Collection Time: 05/02/2022 12:57 PM   Specimen: Urine, Clean Catch  Result Value Ref Range Status   Specimen Description URINE, CLEAN CATCH  Final   Special Requests NONE  Final   Culture >=100,000 COLONIES/mL STAPHYLOCOCCUS AUREUS (A)  Final   Report Status 05/18/2022 FINAL  Final   Organism ID, Bacteria STAPHYLOCOCCUS AUREUS (A)  Final      Susceptibility   Staphylococcus aureus - MIC*    CIPROFLOXACIN <=0.5 SENSITIVE Sensitive     GENTAMICIN <=0.5 SENSITIVE Sensitive     NITROFURANTOIN <=16 SENSITIVE Sensitive     OXACILLIN <=0.25 SENSITIVE Sensitive     TETRACYCLINE <=1 SENSITIVE Sensitive     VANCOMYCIN <=0.5 SENSITIVE Sensitive     TRIMETH/SULFA <=10 SENSITIVE Sensitive     CLINDAMYCIN <=0.25 SENSITIVE Sensitive     RIFAMPIN <=0.5 SENSITIVE Sensitive     Inducible Clindamycin NEGATIVE Sensitive     * >=100,000 COLONIES/mL STAPHYLOCOCCUS AUREUS  Culture, blood (single)     Status: None   Collection Time: 05/08/2022  3:00 PM   Specimen: BLOOD  Result Value Ref Range Status   Specimen Description BLOOD BLOOD RIGHT ARM  Final   Special Requests   Final    BOTTLES DRAWN AEROBIC AND ANAEROBIC Blood Culture results may not be optimal due to an excessive volume of blood received in culture bottles   Culture   Final    NO GROWTH 5 DAYS Performed at [facility] [address] [place], [place] [address]    Report Status 05/21/2022 FINAL  Final  Culture, blood (Routine X 2) w Reflex to ID Panel     Status: None (Preliminary result)   Collection Time: 05/22/22  6:10 PM   Specimen: BLOOD  Result Value Ref Range Status   Specimen Description BLOOD RIGHT ANTECUBITAL   Final   Special Requests   Final    BOTTLES DRAWN AEROBIC AND ANAEROBIC Blood Culture adequate volume   Culture   Final    NO GROWTH < 12 HOURS Performed at [facility], [address] [place], [place] [address]    Report Status PENDING  Incomplete  Culture, blood (Routine X 2) w Reflex to ID Panel  Status: None (Preliminary result)   Collection Time: 05/22/22  6:10 PM   Specimen: BLOOD  Result Value Ref Range Status   Specimen Description BLOOD BLOOD RIGHT HAND  Final   Special Requests   Final    BOTTLES DRAWN AEROBIC AND ANAEROBIC Blood Culture adequate volume   Culture   Final    NO GROWTH < 12 HOURS Performed at [facility], [address] [place], [place] [address]    Report Status PENDING  Incomplete         Radiology Studies: CT ASPIRATION N/S  Result Date: 05/23/2022 INDICATION: DISCITIS WITH LEFT PSOAS SCATTERED SMALL ABSCESSES EXAM: ULTRASOUND ASPIRATION SMALL LEFT PSOAS ABSCESS MEDICATIONS: The patient is currently admitted to the hospital and receiving intravenous antibiotics. The antibiotics were administered within an appropriate time frame prior to the initiation of the procedure. ANESTHESIA/SEDATION: Total intra-service moderate Sedation Time: NONE. The patient's level of consciousness and vital signs were monitored continuously by radiology nursing throughout the procedure under my direct supervision. COMPLICATIONS: None immediate. PROCEDURE: Informed written consent was obtained from the patient after a thorough discussion of the procedural risks, benefits and alternatives. All questions were addressed. Maximal Sterile Barrier Technique was utilized including caps, mask, sterile gowns, sterile gloves, sterile drape, hand hygiene and skin antiseptic. A timeout was performed prior to the initiation of the procedure. Previous imaging reviewed. Patient positioned prone. Noncontrast localization CT performed. One of the small left psoas abscess  was localized and marked for a posterior approach. Under sterile conditions and local anesthesia, an 18 gauge 15 cm access was advanced into a small left psoas abscess. Needle position confirmed with CT. Syringe aspiration yielded 10 cc blood tinged purulent fluid. Sample sent for culture. Needle removed. Patient tolerated procedure well. No immediate complication. IMPRESSION: Successful CT-guided left psoas abscess aspiration. Electronically Signed   By: Jerilynn Mages.  Shick M.D.   On: 05/23/2022 10:32   MR THORACIC SPINE W WO CONTRAST  Result Date: 05/22/2022 CLINICAL DATA:  Mid and lower back pain. EXAM: MRI THORACIC AND LUMBAR SPINE WITHOUT AND WITH CONTRAST TECHNIQUE: Multiplanar and multiecho pulse sequences of the thoracic and lumbar spine were obtained without and with intravenous contrast. CONTRAST:  36m GADAVIST GADOBUTROL 1 MMOL/ML IV SOLN COMPARISON:  CT scan 05/22/2022 FINDINGS: MRI THORACIC SPINE FINDINGS Alignment:  Normal Vertebrae: Normal marrow signal. No bone lesions or fractures. Benign scattered hemangiomas are noted. Cord: Normal cord signal intensity. No cord lesions cord enhancement. Paraspinal and other soft tissues: Bilateral pleural effusions. Disc levels: No thoracic disc protrusions spinal or foraminal stenosis. No findings to suggest discitis or osteomyelitis. MRI LUMBAR SPINE FINDINGS Segmentation: There are five lumbar type vertebral bodies. The last full intervertebral disc space is labeled L5-S1. Alignment:  Normal Vertebrae: Remote L1 compression fracture. Benign L2 hemangioma. Discitis at L2-3 with osteomyelitis involving L2 and L3. Associated epidural abscess extending from the midbody of L2 down to the bottom of L4. Conus medullaris: Extends to the T12-L1 level and appears normal. Paraspinal and other soft tissues: Large complex left psoas abscess extending from the upper pelvis to the L2 level. Disc levels: T12-L1: Mild retropulsion L1 but no canal compromise. Mild bilateral lateral  recess encroachment. L2-3: Mild to moderate facet disease bulging annulus with bilateral lateral recess stenosis. L2-3: Discitis with a fluid-filled disc space and associated osteomyelitis involving the inferior aspect of L2 and most of the L3 vertebral body which demonstrates a pathologic compression fracture. Associated moderate-sized epidural abscess which is creating mass effect on the  ventral thecal sac. L3-4: Bulging degenerated annulus epidural abscess and facet disease contributing to moderate to moderately severe spinal and bilateral lateral recess stenosis. L4-5: Broad-based bulging annulus and central disc protrusion in combination with facet disease and ligamentum flavum thickening contributing to moderate spinal and bilateral lateral recess stenosis. There is also mild bilateral foraminal stenosis. L5-S1: Central disc protrusion and facet disease with mass effect the ventral thecal sac and possible irritation of both S1 nerve roots right greater than left. Mild bilateral foraminal stenosis. IMPRESSION: 1. 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 Mild pathologic compression fracture of L3. Normal normal associated epidural abscess extending from the midbody of L2 down to the bottom of L4. 2. Large complex left psoas abscess extending from the upper pelvis to the L2 level. 3. Remote L1 compression fracture. 4. No findings for discitis or osteomyelitis in the thoracic spine 5. Mild bilateral lateral recess encroachment at T12-L1 and L1-2. 6. 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 These results will be called to the ordering clinician or representative by the Radiologist Assistant, and communication documented in the PACS or [employer]. Electronically Signed   By: Marijo Sanes M.D.   On: 05/22/2022 17:08   MR Lumbar Spine W Wo Contrast  Result Date: 05/22/2022 CLINICAL DATA:   Mid and lower back pain. EXAM: MRI THORACIC AND LUMBAR SPINE WITHOUT AND WITH CONTRAST TECHNIQUE: Multiplanar and multiecho pulse sequences of the thoracic and lumbar spine were obtained without and with intravenous contrast. CONTRAST:  47m GADAVIST GADOBUTROL 1 MMOL/ML IV SOLN COMPARISON:  CT scan 05/15/2022 FINDINGS: MRI THORACIC SPINE FINDINGS Alignment:  Normal Vertebrae: Normal marrow signal. No bone lesions or fractures. Benign scattered hemangiomas are noted. Cord: Normal cord signal intensity. No cord lesions cord enhancement. Paraspinal and other soft tissues: Bilateral pleural effusions. Disc levels: No thoracic disc protrusions spinal or foraminal stenosis. No findings to suggest discitis or osteomyelitis. MRI LUMBAR SPINE FINDINGS Segmentation: There are five lumbar type vertebral bodies. The last full intervertebral disc space is labeled L5-S1. Alignment:  Normal Vertebrae: Remote L1 compression fracture. Benign L2 hemangioma. Discitis at L2-3 with osteomyelitis involving L2 and L3. Associated epidural abscess extending from the midbody of L2 down to the bottom of L4. Conus medullaris: Extends to the T12-L1 level and appears normal. Paraspinal and other soft tissues: Large complex left psoas abscess extending from the upper pelvis to the L2 level. Disc levels: T12-L1: Mild retropulsion L1 but no canal compromise. Mild bilateral lateral recess encroachment. L2-3: Mild to moderate facet disease bulging annulus with bilateral lateral recess stenosis. L2-3: Discitis with a fluid-filled disc space and associated osteomyelitis involving the inferior aspect of L2 and most of the L3 vertebral body which demonstrates a pathologic compression fracture. Associated moderate-sized epidural abscess which is creating mass effect on the ventral thecal sac. L3-4: Bulging degenerated annulus epidural abscess and facet disease contributing to moderate to moderately severe spinal and bilateral lateral recess stenosis.  L4-5: Broad-based bulging annulus and central disc protrusion in combination with facet disease and ligamentum flavum thickening contributing to moderate spinal and bilateral lateral recess stenosis. There is also mild bilateral foraminal stenosis. L5-S1: Central disc protrusion and facet disease with mass effect the ventral thecal sac and possible irritation of both S1 nerve roots right greater than left. Mild bilateral foraminal stenosis. IMPRESSION: 1. Discitis at L2-3 with associated osteomyelitis involving the inferior aspect of L2 and most  of the L3 vertebral body. Mild pathologic compression fracture of L3. Normal normal associated epidural abscess extending from the midbody of L2 down to the bottom of L4. 2. Large complex left psoas abscess extending from the upper pelvis to the L2 level. 3. Remote L1 compression fracture. 4. No findings for discitis or osteomyelitis in the thoracic spine 5. Mild bilateral lateral recess encroachment at T12-L1 and L1-2. 6. 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 These results will be called to the ordering clinician or representative by the Radiologist Assistant, and communication documented in the PACS or [employer]. Electronically Signed   By: Marijo Sanes M.D.   On: 05/22/2022 17:08        Scheduled Meds:  Chlorhexidine Gluconate Cloth  6 each Topical Daily   diltiazem  360 mg Oral Daily   FLUoxetine  40 mg Oral Daily   fluvoxaMINE  100 mg Oral BID   furosemide  20 mg Intravenous BID   insulin aspart  0-5 Units Subcutaneous QHS   insulin aspart  0-9 Units Subcutaneous TID WC   lamoTRIgine  200 mg Oral BID   lidocaine  1 patch Transdermal Q24H   metoprolol succinate  100 mg Oral Daily   omega-3 acid ethyl esters  1 g Oral Daily   polyethylene glycol  17 g Oral Daily   risperiDONE  3 mg Oral QHS   Continuous Infusions:   ceFAZolin (ANCEF) IV 2 g (05/23/22 0525)     LOS: 7 days     Sidney Ace, MD [facility]   If 7PM-7AM, please contact night-coverage  05/23/2022, 1:48 PM

## 2022-05-23 NOTE — Progress Notes (Signed)
Patient remains clinically unchanged from pre procedure which is sommulent, arouses to spoken voice, noting adequate blood glucose from earlier. States he is in hospital when asked, tolerated procedure well with local anesthetic used per DR Annamaria Boots. Report given to [facility] post procedure 2A.

## 2022-05-23 NOTE — Plan of Care (Signed)
Patient on way to OR - Report giving to Specials.  Stated  - "no need for ANY meds at this point, since would need applesauce to take.  Holding meds and will give when returns to the floor.

## 2022-05-23 NOTE — Progress Notes (Signed)
PT Cancellation Note  Patient Details Name: Pedro Mccoy MRN: [id number] DOB: September 21, 1950   Cancelled Treatment:    Reason Eval/Treat Not Completed: Other (comment). Cleared with MD to attempt evaluation. On arrival to room, pt out of room taken to OR for drainage of L psoas abscess. Per notes, continues to be obtunded. Will attempt one last time tomorrow to see if pt able to participate in therapy.   Altie Savard 05/23/2022, 9:23 AM Greggory Stallion, PT, DPT, GCS [phone]

## 2022-05-23 NOTE — Progress Notes (Signed)
Orthopedic Tech Progress Note Patient Details:  Pedro Mccoy 03-12-1951 [id number]         Patient ID: Pedro Mccoy, male   DOB: 1951/02/25, 71 y.o.   MRN: [id number] Brace ordered. Pedro Mccoy 05/23/2022, 10:04 PM

## 2022-05-23 NOTE — Procedures (Signed)
Interventional Radiology Procedure Note  Procedure: CT ASPIRATION LEFT PSOAS ABSCESS    Complications: None  Estimated Blood Loss: 0  Findings: 10CC PURULENT FLD ASPIRATED CX SENT    Tamera Punt, MD

## 2022-05-24 DIAGNOSIS — K6812 Psoas muscle abscess: Secondary | ICD-10-CM | POA: Diagnosis not present

## 2022-05-24 DIAGNOSIS — A4189 Other specified sepsis: Secondary | ICD-10-CM | POA: Diagnosis not present

## 2022-05-24 DIAGNOSIS — R6521 Severe sepsis with septic shock: Secondary | ICD-10-CM | POA: Diagnosis not present

## 2022-05-24 DIAGNOSIS — M4620 Osteomyelitis of vertebra, site unspecified: Secondary | ICD-10-CM

## 2022-05-24 LAB — BASIC METABOLIC PANEL
Anion gap: 9 (ref 5–15)
BUN: 34 mg/dL — ABNORMAL HIGH (ref 8–23)
CO2: 24 mmol/L (ref 22–32)
Calcium: 7.5 mg/dL — ABNORMAL LOW (ref 8.9–10.3)
Chloride: 102 mmol/L (ref 98–111)
Creatinine, Ser: 1.15 mg/dL (ref 0.61–1.24)
GFR, Estimated: >60 mL/min (ref >60)
Glucose, Bld: 167 mg/dL — ABNORMAL HIGH (ref 70–99)
Potassium: 3.2 mmol/L — ABNORMAL LOW (ref 3.5–5.1)
Sodium: 135 mmol/L (ref 135–145)

## 2022-05-24 LAB — CBC WITH DIFFERENTIAL/PLATELET
Abs Immature Granulocytes: 1.35 10*3/uL — ABNORMAL HIGH (ref 0.00–0.07)
Basophils Absolute: 0.1 10*3/uL (ref 0.0–0.1)
Basophils Relative: 1 %
Eosinophils Absolute: 0.1 10*3/uL (ref 0.0–0.5)
Eosinophils Relative: 1 %
HCT: 34.6 % — ABNORMAL LOW (ref 39.0–52.0)
Hemoglobin: 11.8 g/dL — ABNORMAL LOW (ref 13.0–17.0)
Immature Granulocytes: 7 %
Lymphocytes Relative: 8 %
Lymphs Abs: 1.4 10*3/uL (ref 0.7–4.0)
MCH: 28.6 pg (ref 26.0–34.0)
MCHC: 34.1 g/dL (ref 30.0–36.0)
MCV: 84 fL (ref 80.0–100.0)
Monocytes Absolute: 0.8 10*3/uL (ref 0.1–1.0)
Monocytes Relative: 4 %
Neutro Abs: 14.6 10*3/uL — ABNORMAL HIGH (ref 1.7–7.7)
Neutrophils Relative %: 79 %
Platelets: 224 10*3/uL (ref 150–400)
RBC: 4.12 MIL/uL — ABNORMAL LOW (ref 4.22–5.81)
RDW: 13.9 % (ref 11.5–15.5)
Smear Review: NORMAL
WBC: 18.4 10*3/uL — ABNORMAL HIGH (ref 4.0–10.5)
nRBC: 0 % (ref 0.0–0.2)

## 2022-05-24 LAB — GLUCOSE, CAPILLARY
Glucose-Capillary: 182 mg/dL — ABNORMAL HIGH (ref 70–99)
Glucose-Capillary: 176 mg/dL — ABNORMAL HIGH (ref 70–99)
Glucose-Capillary: 183 mg/dL — ABNORMAL HIGH (ref 70–99)
Glucose-Capillary: 178 mg/dL — ABNORMAL HIGH (ref 70–99)

## 2022-05-24 MED ORDER — APIXABAN 5 MG PO TABS
5.0000 mg | ORAL_TABLET | Freq: Two times a day (BID) | ORAL | Status: DC
  Administered 2022-05-24 – 2022-05-29 (×11): 5 mg via ORAL
  Filled 2022-05-24 (×11): qty 1

## 2022-05-24 NOTE — Progress Notes (Signed)
[place] for apixaban Indication: atrial fibrillation  Allergies  Allergen Reactions   Ace Inhibitors Hives and Rash   Hydrochlorothiazide Hives   Pioglitazone Nausea Only   Codeine     Has tolerated morphine in the past   Sitagliptin Nausea And Vomiting   Lithium Diarrhea    Patient Measurements: Height: '6\' 1"'$  (185.4 cm) Weight: 82 kg (180 lb 12.4 oz) IBW/kg (Calculated) : 79.9  Vital Signs: Temp: 98.8 F (37.1 C) (11/25 1124) Temp Source: Oral (11/25 1124) BP: 117/82 (11/25 1124) Pulse Rate: 82 (11/25 1124)  Labs: Recent Labs    05/22/22 0511 05/23/22 0419 05/24/22 0411  HGB 12.6* 12.6* 11.8*  HCT 36.7* 37.2* 34.6*  PLT 230 244 224  CREATININE 0.94 1.08 1.15    Estimated Creatinine Clearance: 66.6 mL/min (by C-G formula based on SCr of 1.15 mg/dL).   Medical History: Past Medical History:  Diagnosis Date   Hypertension    Hypertriglyceridemia .    Medications:  Scheduled:   Chlorhexidine Gluconate Cloth  6 each Topical Daily   diltiazem  360 mg Oral Daily   FLUoxetine  40 mg Oral Daily   fluvoxaMINE  100 mg Oral BID   furosemide  20 mg Intravenous BID   insulin aspart  0-5 Units Subcutaneous QHS   insulin aspart  0-9 Units Subcutaneous TID WC   lamoTRIgine  200 mg Oral BID   lidocaine  1 patch Transdermal Q24H   metoprolol succinate  100 mg Oral Daily   omega-3 acid ethyl esters  1 g Oral Daily   polyethylene glycol  17 g Oral Daily   risperiDONE  3 mg Oral QHS    Assessment: 71 y.o. male with medical history significant of metastasized pancreatic cancer on chemotherapy, hypertension, hyperlipidemia, diabetes mellitus, depression with anxiety, schizophrenia, tardive dyskinesia, who presents with weakness, fall and abdominal pain.   Goal of Therapy:  Monitor platelets by anticoagulation protocol: Yes   Plan:  Start apixaban 5 mg po BID CBC and serum creatinine weekly unless more frequent monitoring  needed  Dallie Piles 05/24/2022,1:51 PM

## 2022-05-24 NOTE — Progress Notes (Signed)
PT Cancellation Note  Patient Details Name: Pedro Mccoy MRN: [id number] DOB: [birth year]   Cancelled Treatment:    Reason Eval/Treat Not Completed: Patient declined, no reason specified. Pt adamantly refusing to participate in PT evaluation or any mobility at this time despite PT's max encouragement and education regarding the benefits of mobility. PT will continue to attempt evaluation as available and appropriate.    [place] 05/24/2022, 10:38 AM

## 2022-05-24 NOTE — Consult Note (Signed)
Consult requested by:  Dr. Priscella Mccoy  Consult requested for:  Discitis, epidural abscess  Primary Physician:  Pedro Bolk, FNP  History of Present Illness: 05/24/2022 [name] is here today with a chief complaint of fall and back pain.  He was brought to the emergency department where he was noted to have generalized weakness.  He had fallen and had multiple areas of pain.  He is in the middle of chemotherapy for metastatic pancreatic cancer.  To initial work-up, he was also noted to have a urinary tract infection.  Because of continuing back pain, MRI scan of the thoracic and lumbar spine was ultimately performed.  This showed evidence of discitis.  I was consulted for management recommendations.  He underwent CT-guided biopsy of psoas abscess yesterday.  On questioning today, he denies any changes in his neurologic symptoms.  He denies numbness or tingling or weakness in his legs.  Review of Systems:  A 10 point review of systems is negative, except for the pertinent positives and negatives detailed in the HPI.  Past Medical History: Past Medical History:  Diagnosis Date   Hypertension    Hypertriglyceridemia .    Past Surgical History: Past Surgical History:  Procedure Laterality Date   NO PAST SURGERIES     TOTAL HIP ARTHROPLASTY Right 05/21/2021   Procedure: ANTERIOR HIP TOTAL ARTHROPLASTY;  Surgeon: Pedro Knows, MD;  Location: [facility] ORS;  Service: Orthopedics;  Laterality: Right;    Allergies: Allergies as of 05/27/2022 - Review Complete 05/04/2022  Allergen Reaction Noted   Ace inhibitors Hives and Rash 11/06/2009   Hydrochlorothiazide Hives 11/06/2009   Pioglitazone Nausea Only 02/21/2016   Codeine  02/29/2016   Sitagliptin Nausea And Vomiting 05/29/2014   Lithium Diarrhea 05/21/2021    Medications: Current Meds  Medication Sig   BELSOMRA 20 MG TABS Take 20 mg by mouth at bedtime.   FLUoxetine (PROZAC) 40 MG capsule Take 40 mg by mouth  daily.   fluvoxaMINE (LUVOX) 100 MG tablet Take 100 mg by mouth 2 (two) times a day.   lamoTRIgine (LAMICTAL) 200 MG tablet Take 200 mg by mouth 2 (two) times a day.   LORazepam (ATIVAN) 1 MG tablet Take 1-1.5 mg by mouth daily as needed for anxiety.   metFORMIN (GLUCOPHAGE) 1000 MG tablet Take 1,000 mg by mouth 2 (two) times a day.   metoprolol succinate (TOPROL-XL) 50 MG 24 hr tablet Take 50 mg by mouth daily. Take with or immediately following a meal.   NIFEdipine (PROCARDIA XL/NIFEDICAL-XL) 90 MG 24 hr tablet Take 90 mg by mouth daily.   omega-3 acid ethyl esters (LOVAZA) 1 g capsule Take 1 g by mouth daily.   risperiDONE (RISPERDAL) 3 MG tablet Take 3 mg by mouth at bedtime.   rosuvastatin (CRESTOR) 20 MG tablet Take 20 mg by mouth daily.   [DISCONTINUED] risperiDONE (RISPERDAL) 1 MG tablet Take 1 mg by mouth 2 (two) times a day.    Social History: Social History   Tobacco Use   Smoking status: Never   Smokeless tobacco: Never  Vaping Use   Vaping Use: Never used  Substance Use Topics   Alcohol use: No   Drug use: No    Family Medical History: Family History  Problem Relation Age of Onset   COPD Mother    Heart attack Father     Physical Examination: Vitals:   05/24/22 0300 05/24/22 0753  BP: (!) 128/8 126/85  Pulse:  91  Resp: 18 17  Temp: 98.8 F (37.1 C) 98.5 F (36.9 C)  SpO2: 96% 97%    General: Patient is well developed, well nourished, calm, collected, and in no apparent distress. Attention to examination is appropriate.  Neck:   Supple.  Full range of motion.  Respiratory: Patient is breathing without any difficulty.   NEUROLOGICAL:     Awake, alert, oriented to person, place, and time.  Speech is clear and fluent. Fund of knowledge is appropriate.   Cranial Nerves: Pupils equal round and reactive to light.  Facial tone is symmetric.  Facial sensation is symmetric. Shoulder shrug is symmetric. Tongue protrusion is midline.  There is no pronator  drift.   Strength: Side Biceps Triceps Deltoid Interossei Grip Wrist Ext. Wrist Flex.  R '5 5 5 5 5 5 5  '$ L '5 5 5 5 5 5 5   '$ Side Iliopsoas Quads Hamstring PF DF EHL  R '5 5 5 5 5 5  '$ L '4 5 5 5 5 5   '$ Reflexes are 1+ and symmetric at the biceps, triceps, brachioradialis, patella and achilles.   Hoffman's is absent.   Bilateral upper and lower extremity sensation is intact to light touch.    No evidence of dysmetria noted.  Gait is untested.     Medical Decision Making  Imaging: MRI TL spine 05/22/2022 IMPRESSION: 1. 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 Mild pathologic compression fracture of L3. Normal normal associated epidural abscess extending from the midbody of L2 down to the bottom of L4. 2. Large complex left psoas abscess extending from the upper pelvis to the L2 level. 3. Remote L1 compression fracture. 4. No findings for discitis or osteomyelitis in the thoracic spine 5. Mild bilateral lateral recess encroachment at T12-L1 and L1-2. 6. 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   These results will be called to the ordering clinician or representative by the Radiologist Assistant, and communication documented in the PACS or [employer].     Electronically Signed   By: Pedro Mccoy M.D.   On: 05/22/2022 17:08  I have personally reviewed the images and agree with the above interpretation.  Cx show MSSA from urine and from psoas aspiration on 05/23/22  Assessment and Plan: [name] is a pleasant 71 y.o. male with discitis at L2-3.  There is an epidural component, but he currently has no symptoms referable to this.  He is unfortunately suffering from metastatic pancreatic cancer with recent worsening on imaging studies.  As such, I would have a very high threshold to consider any major surgical intervention.  I did order an LSO brace  which can be used when he is out of bed for symptomatic control.  I will defer antibiotic choice and duration to my colleague from infectious disease.  I remain available for stabilization if he develops destructive lesion at L2-3 and is appropriate given his oncologic diagnosis.  I suspect surgical intervention will not be appropriate for this patient.  I have communicated my recommendations to the requesting physician and coordinated care to facilitate these recommendations.     Nakia Koble K. Izora Ribas MD, [facility]

## 2022-05-24 NOTE — Plan of Care (Signed)
  Problem: Education: Goal: Knowledge of General Education information will improve Description: Including pain rating scale, medication(s)/side effects and non-pharmacologic comfort measures Outcome: Not Progressing   Problem: Activity: Goal: Risk for activity intolerance will decrease Outcome: Not Progressing   Problem: Nutrition: Goal: Adequate nutrition will be maintained Outcome: Not Progressing   

## 2022-05-24 NOTE — Progress Notes (Signed)
PROGRESS NOTE    Pedro Mccoy  GDJ:[id number] DOB: Jun 25, 1951 DOA: 05/18/2022 PCP: Romualdo Bolk, FNP    Brief Narrative:  71 year old male with past medical history of metastatic pancreatic cancer on chemotherapy, hypertension, diabetes mellitus, schizophrenia and tardive dyskinesia presented to the emergency room on 11/17 with complaints of weakness, fall x2 earlier that day and abdominal pain.  Work-up with septic shock secondary to pyelonephritis, acute versus subacute fracture of L3 vertebral body and rhabdomyolysis with CPK of 22,000.  Patient admitted to the hospitalist service and started on IV fluids and antibiotics.  By 11/19, urine cultures positive for MSSA.   Following admission, on the evening of 11/17, patient went to rapid atrial flutter.  Attempted treatment with metoprolol and IV fluids.  11/19, tachycardia persisting and patient started on Cardizem drip.  Heart rate starting to improve, and attempting to wean off of Cardizem drip. GTT weaned off 11/21.  Rates stable on PO cardizem and metop  11/23: White count continues to rise. No fevers. Unclear etiology 11/24: L2L3 discitis noted, epidural abscess. Large psoas abscess.  Sent to IR this morning, psoas abscess drained 11/25: White count downtrending    Assessment & Plan:   Principal Problem:   Rhabdomyolysis Active Problems:   Septic shock (HCC)   UTI (urinary tract infection)   Acute pyelonephritis   Atrial flutter (HCC)   Acute urinary retention   Pancreatic cancer (Pedro Mccoy)   HLD (hyperlipidemia)   Lumbar compression fracture, closed, initial encounter ([place])   HTN (hypertension)   Schizophrenia (Pedro Mccoy)   Depression with anxiety   Diabetes mellitus without complication (HCC)   Abdominal pain   Abnormal LFTs   Fall at home, initial encounter   Lumbar discitis   Vertebral osteomyelitis (Pedro Mccoy)   Psoas abscess, left (Pedro Mccoy)   Spinal epidural abscess  Septic shock ([place]) L2L3 osteomyelitis with epidural  abscess Left psoas abscess Meets criteria for septic shock on admission given urinary source with pyelonephritis seen on CT, lactic acid level of 4.3, white blood cell count of 16.8, tachypnea and tachycardia.   Patient started on aggressive fluid resuscitation with IV fluids and antibiotics.   Blood culture no growth.   Urine culture with Staph aureus.   ID consulted.  MRI spine with osteo/abscess Drained by IR on 11/24 Plan: Continue Ancef DO NOT access Pedro Follow repeat blood cx and abscess cultures, abscess cultures with GPC's Daily labs Vitals and fever curve ID following  Rhabdomyolysis Secondary to fall and some likely infectious component?  Responding well to IV fluids and down to 847.  Can stop trending   Acute pyelonephritis As above.  Urine cultures positive for MSSA.  Infectious disease consulted.  On IV Ancef   Atrial flutter ([place]) On metoprolol plus as needed Lopressor.  Rate control improving.  Responded to Cardizem drip.  Drip weaned off.  Now on PO cardizem.  Continue PO eliquis (dose held on 11/24 for procedure.  Will restart 11/25)   Acute urinary retention Likely due to volume shifts and episodes of hypotension.  Foley catheter placed as of 11/19.   Lumbar compression fracture, closed, initial encounter (Pedro Mccoy) Unclear whether this is compression fracture or pathologic in the setting of discitis.  Unfortunately patient is not a surgical candidate.  Case discussed with Dr. Cari Caraway.  LSO brace ordered for patient comfort.   Pancreatic cancer (Pedro Mccoy) Metastatic mets to the liver.  Oncology following with recent chemotherapy.  Sees onc at [facility].  Next chemo scheduled for 11/30.  Poor functional  status.  Acute issues preventing discharge at this time.  Recommend continued follow-up with palliative care while admitted.   HTN (hypertension) Blood pressure is well-controlled.  Metoprolol and Cardizem as above.   Schizophrenia (Pedro Mccoy) Continue home medications    Diabetes mellitus without complication ([place]) Sliding scale   DVT prophylaxis: Eliquis Code Status: FULL Family Communication: Sister Pedro Mccoy [phone] on 11/25 Disposition Plan: Status is: Inpatient Remains inpatient appropriate because: Discitis/osteomyelitis on IV antibiotics.  Will need further discussion regarding goals of care.   Level of care: Progressive  Consultants:  Palliative care  Procedures:  none  Antimicrobials: Ancef   Subjective: Seen and examined.  No specific complaints.  Poor historian  Objective: Vitals:   05/24/22 0200 05/24/22 0300 05/24/22 0753 05/24/22 1124  BP:  (!) 128/8 126/85 117/82  Pulse:   91 82  Resp: '18 18 17 19  '$ Temp:  98.8 F (37.1 C) 98.5 F (36.9 C) 98.8 F (37.1 C)  TempSrc:  Oral Oral Oral  SpO2:  96% 97% 98%  Weight:      Height:        Intake/Output Summary (Last 24 hours) at 05/24/2022 1337 Last data filed at 05/24/2022 1124 Gross per 24 hour  Intake 920 ml  Output 600 ml  Net 320 ml   Filed Weights   05/01/2022 1042 05/23/22 0922  Weight: 82.1 kg 82 kg    Examination:  General exam: No acute distress.  Appears frail.  Ill-appearing. Respiratory system: Clear to auscultation. Respiratory effort normal. Cardiovascular system: Reg rate, irreg rhythm, no murmur Gastrointestinal system: Soft, NTND, normal BS Central nervous system: Alert and oriented x 2. No focal neurological deficits. Extremities: Decreased power LLE Skin: Ecchymoses on left side of face Psychiatry: Judgement and insight appear impaired. Mood & affect flattened.     Data Reviewed: I have personally reviewed following labs and imaging studies  CBC: Recent Labs  Lab 05/20/22 0443 05/21/22 0340 05/22/22 0511 05/23/22 0419 05/24/22 0411  WBC 15.0* 16.7* 19.3* 20.2* 18.4*  NEUTROABS  --   --   --   --  14.6*  HGB 12.0* 12.6* 12.6* 12.6* 11.8*  HCT 34.4* 36.7* 36.7* 37.2* 34.6*  MCV 82.1 81.2 82.1 83.4 84.0  PLT 142* 185 230 244 737    Basic Metabolic Panel: Recent Labs  Lab 05/20/22 0443 05/21/22 0340 05/22/22 0511 05/23/22 0419 05/24/22 0411  NA 137 137 140 141 135  K 3.3* 2.8* 2.9* 2.9* 3.2*  CL 112* 109 109 107 102  CO2 16* 21* '22 24 24  '$ GLUCOSE 176* 175* 172* 161* 167*  BUN 20 20 25* 28* 34*  CREATININE 0.89 0.83 0.94 1.08 1.15  CALCIUM 7.7* 7.9* 8.0* 7.9* 7.5*   GFR: Estimated Creatinine Clearance: 66.6 mL/min (by C-G formula based on SCr of 1.15 mg/dL). Liver Function Tests: Recent Labs  Lab 05/18/22 0505 05/19/22 0452 05/20/22 0443  AST 130* 77* 49*  ALT 58* 50* 37  ALKPHOS 64 87 139*  BILITOT 1.0 0.8 1.2  PROT 4.9* 5.0* 5.0*  ALBUMIN 2.5* 2.3* 2.1*   No results for input(s): "LIPASE", "AMYLASE" in the last 168 hours.  No results for input(s): "AMMONIA" in the last 168 hours. Coagulation Profile: No results for input(s): "INR", "PROTIME" in the last 168 hours.  Cardiac Enzymes: Recent Labs  Lab 05/18/22 0505 05/19/22 0452 05/20/22 0443  CKTOTAL 4,035* 1,835* 847*   BNP (last 3 results) No results for input(s): "PROBNP" in the last 8760 hours. HbA1C: No results for input(s): "  HGBA1C" in the last 72 hours. CBG: Recent Labs  Lab 05/23/22 1157 05/23/22 1615 05/23/22 2057 05/24/22 0943 05/24/22 1124  GLUCAP 208* 175* 182* 182* 176*   Lipid Profile: Recent Labs    05/22/22 0511  CHOL 68  HDL <10*  LDLCALC NOT CALCULATED  TRIG 114  CHOLHDL NOT CALCULATED   Thyroid Function Tests: No results for input(s): "TSH", "T4TOTAL", "FREET4", "T3FREE", "THYROIDAB" in the last 72 hours. Anemia Panel: No results for input(s): "VITAMINB12", "FOLATE", "FERRITIN", "TIBC", "IRON", "RETICCTPCT" in the last 72 hours. Sepsis Labs: Recent Labs  Lab 05/17/22 1517 05/18/22 0505 05/19/22 0452 05/20/22 0443 05/21/22 0340  PROCALCITON  --  14.72 6.50 3.50 2.19  LATICACIDVEN 1.5  --   --   --   --     Recent Results (from the past 240 hour(s))  Urine Culture     Status: Abnormal    Collection Time: 05/10/2022 12:57 PM   Specimen: Urine, Clean Catch  Result Value Ref Range Status   Specimen Description URINE, CLEAN CATCH  Final   Special Requests NONE  Final   Culture >=100,000 COLONIES/mL STAPHYLOCOCCUS AUREUS (A)  Final   Report Status 05/18/2022 FINAL  Final   Organism ID, Bacteria STAPHYLOCOCCUS AUREUS (A)  Final      Susceptibility   Staphylococcus aureus - MIC*    CIPROFLOXACIN <=0.5 SENSITIVE Sensitive     GENTAMICIN <=0.5 SENSITIVE Sensitive     NITROFURANTOIN <=16 SENSITIVE Sensitive     OXACILLIN <=0.25 SENSITIVE Sensitive     TETRACYCLINE <=1 SENSITIVE Sensitive     VANCOMYCIN <=0.5 SENSITIVE Sensitive     TRIMETH/SULFA <=10 SENSITIVE Sensitive     CLINDAMYCIN <=0.25 SENSITIVE Sensitive     RIFAMPIN <=0.5 SENSITIVE Sensitive     Inducible Clindamycin NEGATIVE Sensitive     * >=100,000 COLONIES/mL STAPHYLOCOCCUS AUREUS  Culture, blood (single)     Status: None   Collection Time: 05/17/2022  3:00 PM   Specimen: BLOOD  Result Value Ref Range Status   Specimen Description BLOOD BLOOD RIGHT ARM  Final   Special Requests   Final    BOTTLES DRAWN AEROBIC AND ANAEROBIC Blood Culture results may not be optimal due to an excessive volume of blood received in culture bottles   Culture   Final    NO GROWTH 5 DAYS Performed at [facility], [address] [place], [place] [address]    Report Status 05/21/2022 FINAL  Final  Culture, blood (Routine X 2) w Reflex to ID Panel     Status: None (Preliminary result)   Collection Time: 05/22/22  6:10 PM   Specimen: BLOOD  Result Value Ref Range Status   Specimen Description BLOOD RIGHT ANTECUBITAL  Final   Special Requests   Final    BOTTLES DRAWN AEROBIC AND ANAEROBIC Blood Culture adequate volume   Culture   Final    NO GROWTH 2 DAYS Performed at [facility], [address] [place], [place] [address]    Report Status PENDING  Incomplete  Culture, blood (Routine X 2) w Reflex to ID Panel      Status: None (Preliminary result)   Collection Time: 05/22/22  6:10 PM   Specimen: BLOOD  Result Value Ref Range Status   Specimen Description BLOOD BLOOD RIGHT HAND  Final   Special Requests   Final    BOTTLES DRAWN AEROBIC AND ANAEROBIC Blood Culture adequate volume   Culture   Final    NO GROWTH 2 DAYS Performed at  [facility], [address] [place], [place] [address]    Report Status PENDING  Incomplete  Aerobic/Anaerobic Culture w Gram Stain (surgical/deep wound)     Status: None (Preliminary result)   Collection Time: 05/23/22 10:09 AM   Specimen: Abscess  Result Value Ref Range Status   Specimen Description   Final    ABSCESS Performed at [facility], [address] [place], [place] [address]    Special Requests   Final    NONE Performed at [facility], [address] [place], [place] [address]    Gram Stain   Final    ABUNDANT WBC PRESENT, PREDOMINANTLY PMN FEW GRAM POSITIVE COCCI IN PAIRS    Culture   Final    CULTURE REINCUBATED FOR BETTER GROWTH Performed at [facility], [place] [address], [place], [place] [address]    Report Status PENDING  Incomplete         Radiology Studies: CT ASPIRATION N/S  Result Date: 05/23/2022 INDICATION: DISCITIS WITH LEFT PSOAS SCATTERED SMALL ABSCESSES EXAM: ULTRASOUND ASPIRATION SMALL LEFT PSOAS ABSCESS MEDICATIONS: The patient is currently admitted to the hospital and receiving intravenous antibiotics. The antibiotics were administered within an appropriate time frame prior to the initiation of the procedure. ANESTHESIA/SEDATION: Total intra-service moderate Sedation Time: NONE. The patient's level of consciousness and vital signs were monitored continuously by radiology nursing throughout the procedure under my direct supervision. COMPLICATIONS: None immediate. PROCEDURE: Informed written consent was obtained from the patient after a thorough discussion of the procedural risks, benefits  and alternatives. All questions were addressed. Maximal Sterile Barrier Technique was utilized including caps, mask, sterile gowns, sterile gloves, sterile drape, hand hygiene and skin antiseptic. A timeout was performed prior to the initiation of the procedure. Previous imaging reviewed. Patient positioned prone. Noncontrast localization CT performed. One of the small left psoas abscess was localized and marked for a posterior approach. Under sterile conditions and local anesthesia, an 18 gauge 15 cm access was advanced into a small left psoas abscess. Needle position confirmed with CT. Syringe aspiration yielded 10 cc blood tinged purulent fluid. Sample sent for culture. Needle removed. Patient tolerated procedure well. No immediate complication. IMPRESSION: Successful CT-guided left psoas abscess aspiration. Electronically Signed   By: Jerilynn Mages.  Shick M.D.   On: 05/23/2022 10:32   MR THORACIC SPINE W WO CONTRAST  Result Date: 05/22/2022 CLINICAL DATA:  Mid and lower back pain. EXAM: MRI THORACIC AND LUMBAR SPINE WITHOUT AND WITH CONTRAST TECHNIQUE: Multiplanar and multiecho pulse sequences of the thoracic and lumbar spine were obtained without and with intravenous contrast. CONTRAST:  80m GADAVIST GADOBUTROL 1 MMOL/ML IV SOLN COMPARISON:  CT scan 05/17/2022 FINDINGS: MRI THORACIC SPINE FINDINGS Alignment:  Normal Vertebrae: Normal marrow signal. No bone lesions or fractures. Benign scattered hemangiomas are noted. Cord: Normal cord signal intensity. No cord lesions cord enhancement. Paraspinal and other soft tissues: Bilateral pleural effusions. Disc levels: No thoracic disc protrusions spinal or foraminal stenosis. No findings to suggest discitis or osteomyelitis. MRI LUMBAR SPINE FINDINGS Segmentation: There are five lumbar type vertebral bodies. The last full intervertebral disc space is labeled L5-S1. Alignment:  Normal Vertebrae: Remote L1 compression fracture. Benign L2 hemangioma. Discitis at L2-3 with  osteomyelitis involving L2 and L3. Associated epidural abscess extending from the midbody of L2 down to the bottom of L4. Conus medullaris: Extends to the T12-L1 level and appears normal. Paraspinal and other soft tissues: Large complex left psoas abscess extending from the upper pelvis to  the L2 level. Disc levels: T12-L1: Mild retropulsion L1 but no canal compromise. Mild bilateral lateral recess encroachment. L2-3: Mild to moderate facet disease bulging annulus with bilateral lateral recess stenosis. L2-3: Discitis with a fluid-filled disc space and associated osteomyelitis involving the inferior aspect of L2 and most of the L3 vertebral body which demonstrates a pathologic compression fracture. Associated moderate-sized epidural abscess which is creating mass effect on the ventral thecal sac. L3-4: Bulging degenerated annulus epidural abscess and facet disease contributing to moderate to moderately severe spinal and bilateral lateral recess stenosis. L4-5: Broad-based bulging annulus and central disc protrusion in combination with facet disease and ligamentum flavum thickening contributing to moderate spinal and bilateral lateral recess stenosis. There is also mild bilateral foraminal stenosis. L5-S1: Central disc protrusion and facet disease with mass effect the ventral thecal sac and possible irritation of both S1 nerve roots right greater than left. Mild bilateral foraminal stenosis. IMPRESSION: 1. 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 Mild pathologic compression fracture of L3. Normal normal associated epidural abscess extending from the midbody of L2 down to the bottom of L4. 2. Large complex left psoas abscess extending from the upper pelvis to the L2 level. 3. Remote L1 compression fracture. 4. No findings for discitis or osteomyelitis in the thoracic spine 5. Mild bilateral lateral recess encroachment at T12-L1 and L1-2. 6.  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 These results will be called to the ordering clinician or representative by the Radiologist Assistant, and communication documented in the PACS or [employer]. Electronically Signed   By: Marijo Sanes M.D.   On: 05/22/2022 17:08   MR Lumbar Spine W Wo Contrast  Result Date: 05/22/2022 CLINICAL DATA:  Mid and lower back pain. EXAM: MRI THORACIC AND LUMBAR SPINE WITHOUT AND WITH CONTRAST TECHNIQUE: Multiplanar and multiecho pulse sequences of the thoracic and lumbar spine were obtained without and with intravenous contrast. CONTRAST:  80m GADAVIST GADOBUTROL 1 MMOL/ML IV SOLN COMPARISON:  CT scan 04/30/2022 FINDINGS: MRI THORACIC SPINE FINDINGS Alignment:  Normal Vertebrae: Normal marrow signal. No bone lesions or fractures. Benign scattered hemangiomas are noted. Cord: Normal cord signal intensity. No cord lesions cord enhancement. Paraspinal and other soft tissues: Bilateral pleural effusions. Disc levels: No thoracic disc protrusions spinal or foraminal stenosis. No findings to suggest discitis or osteomyelitis. MRI LUMBAR SPINE FINDINGS Segmentation: There are five lumbar type vertebral bodies. The last full intervertebral disc space is labeled L5-S1. Alignment:  Normal Vertebrae: Remote L1 compression fracture. Benign L2 hemangioma. Discitis at L2-3 with osteomyelitis involving L2 and L3. Associated epidural abscess extending from the midbody of L2 down to the bottom of L4. Conus medullaris: Extends to the T12-L1 level and appears normal. Paraspinal and other soft tissues: Large complex left psoas abscess extending from the upper pelvis to the L2 level. Disc levels: T12-L1: Mild retropulsion L1 but no canal compromise. Mild bilateral lateral recess encroachment. L2-3: Mild to moderate facet disease bulging annulus with bilateral lateral recess stenosis. L2-3: Discitis with a fluid-filled disc space and associated  osteomyelitis involving the inferior aspect of L2 and most of the L3 vertebral body which demonstrates a pathologic compression fracture. Associated moderate-sized epidural abscess which is creating mass effect on the ventral thecal sac. L3-4: Bulging degenerated annulus epidural abscess and facet disease contributing to moderate to moderately severe spinal and bilateral lateral recess stenosis. L4-5: Broad-based bulging annulus and central disc protrusion  in combination with facet disease and ligamentum flavum thickening contributing to moderate spinal and bilateral lateral recess stenosis. There is also mild bilateral foraminal stenosis. L5-S1: Central disc protrusion and facet disease with mass effect the ventral thecal sac and possible irritation of both S1 nerve roots right greater than left. Mild bilateral foraminal stenosis. IMPRESSION: 1. 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 Mild pathologic compression fracture of L3. Normal normal associated epidural abscess extending from the midbody of L2 down to the bottom of L4. 2. Large complex left psoas abscess extending from the upper pelvis to the L2 level. 3. Remote L1 compression fracture. 4. No findings for discitis or osteomyelitis in the thoracic spine 5. Mild bilateral lateral recess encroachment at T12-L1 and L1-2. 6. 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 These results will be called to the ordering clinician or representative by the Radiologist Assistant, and communication documented in the PACS or [employer]. Electronically Signed   By: Marijo Sanes M.D.   On: 05/22/2022 17:08        Scheduled Meds:  Chlorhexidine Gluconate Cloth  6 each Topical Daily   diltiazem  360 mg Oral Daily   FLUoxetine  40 mg Oral Daily   fluvoxaMINE  100 mg Oral BID   furosemide  20 mg Intravenous BID   insulin aspart  0-5 Units  Subcutaneous QHS   insulin aspart  0-9 Units Subcutaneous TID WC   lamoTRIgine  200 mg Oral BID   lidocaine  1 patch Transdermal Q24H   metoprolol succinate  100 mg Oral Daily   omega-3 acid ethyl esters  1 g Oral Daily   polyethylene glycol  17 g Oral Daily   risperiDONE  3 mg Oral QHS   Continuous Infusions:   ceFAZolin (ANCEF) IV 2 g (05/24/22 0519)     LOS: 8 days     Sidney Ace, MD [facility]   If 7PM-7AM, please contact night-coverage  05/24/2022, 1:37 PM

## 2022-05-24 NOTE — Progress Notes (Signed)
   05/24/22 0900  Clinical Encounter Type  Visited With Patient  Visit Type Initial  Referral From Nurse  Consult/Referral To Chaplain   Chaplain responded to nurse consult. Chaplain provided compassionate presence and reflective listening as patient spoke about health challenges. Chaplain discussed AD paperwork and left with patient to think about wishes. Chaplain services are available for follow up as needed.

## 2022-05-25 ENCOUNTER — Encounter: Payer: Self-pay | Admitting: Internal Medicine

## 2022-05-25 DIAGNOSIS — N10 Acute tubulo-interstitial nephritis: Secondary | ICD-10-CM | POA: Diagnosis not present

## 2022-05-25 DIAGNOSIS — K6812 Psoas muscle abscess: Secondary | ICD-10-CM | POA: Diagnosis not present

## 2022-05-25 DIAGNOSIS — A419 Sepsis, unspecified organism: Secondary | ICD-10-CM | POA: Diagnosis not present

## 2022-05-25 DIAGNOSIS — M6282 Rhabdomyolysis: Secondary | ICD-10-CM | POA: Diagnosis not present

## 2022-05-25 LAB — GLUCOSE, CAPILLARY
Glucose-Capillary: 174 mg/dL — ABNORMAL HIGH (ref 70–99)
Glucose-Capillary: 214 mg/dL — ABNORMAL HIGH (ref 70–99)
Glucose-Capillary: 157 mg/dL — ABNORMAL HIGH (ref 70–99)
Glucose-Capillary: 166 mg/dL — ABNORMAL HIGH (ref 70–99)

## 2022-05-25 LAB — POTASSIUM: Potassium: 3.7 mmol/L (ref 3.5–5.1)

## 2022-05-25 NOTE — Progress Notes (Signed)
Progress Note    Diarra Kos  OAC:[id number] DOB: July 14, 1950  DOA: 05/29/2022 PCP: Romualdo Bolk, FNP      Brief Narrative:    Medical records reviewed and are as summarized below:  Luster Hechler is a 71 y.o. male with past medical history of metastatic pancreatic cancer on chemotherapy, hypertension, diabetes mellitus, schizophrenia and tardive dyskinesia presented to the emergency room on 11/17 with complaints of weakness, fall x2 earlier that day and abdominal pain.  Work-up with septic shock secondary to pyelonephritis, acute versus subacute fracture of L3 vertebral body and rhabdomyolysis with CPK of 22,000.  Patient admitted to the hospitalist service and started on IV fluids and antibiotics.  By 11/19, urine cultures positive for MSSA.   Following admission, on the evening of 11/17, patient went to rapid atrial flutter.  Attempted treatment with metoprolol and IV fluids.  11/19, tachycardia persisting and patient started on Cardizem drip.  He was weaned off Cardizem drip on 05/20/2022.  He is on metoprolol and oral Cardizem for rate control.  11/23: White count continues to rise. No fevers. Unclear etiology 11/24: L2L3 discitis noted, epidural abscess. Large psoas abscess.  Sent to IR this morning, psoas abscess drained 11/25: White count downtrending       Assessment/Plan:   Principal Problem:   Rhabdomyolysis Active Problems:   Septic shock (HCC)   UTI (urinary tract infection)   Acute pyelonephritis   Atrial flutter (HCC)   Acute urinary retention   Pancreatic cancer ([place])   HLD (hyperlipidemia)   Lumbar compression fracture, closed, initial encounter ([place])   HTN (hypertension)   Schizophrenia ([place])   Depression with anxiety   Diabetes mellitus without complication ([place])   Abdominal pain   Abnormal LFTs   Fall at home, initial encounter   Lumbar discitis   Vertebral osteomyelitis ([place])   Psoas abscess, left (HCC)   Spinal epidural  abscess    Body mass index is 23.85 kg/m.    Septic shock ([place]) L2-L3 osteomyelitis with epidural abscess Left psoas abscess S/p CT-guided drainage of left psoas abscess on 05/23/2022 Continue IV cefazolin. No growth on blood cultures. Urine culture with Staph aureus.   MRI spine with osteo/abscess Fluid culture from psoas abscess growing Staph aureus DO NOT access port Follow-up with ID   Rhabdomyolysis Improved  Acute pyelonephritis Urine cultures positive for MSSA.  Infectious disease consulted.  On IV Ancef   Atrial flutter (HCC) Weaned off IV Cardizem drip.  Continue oral metoprolol, Cardizem and Eliquis   Acute urinary retention Foley catheter placed on 05/18/2022   Lumbar compression fracture, closed, initial encounter [facility]) Unclear whether this is compression fracture or pathologic in the setting of discitis.  Unfortunately patient is not a surgical candidate.  Patient is not a surgical candidate.  Dr. Izora Ribas, neurosurgeon.  LSO brace ordered for patient comfort.   Pancreatic cancer ([place]) Metastatic mets to the liver.  Oncology following with recent chemotherapy.  Sees onc at [facility].  Next chemo scheduled for 11/30.  Poor functional status.  Acute issues preventing discharge at this time.  Outpatient follow-up with oncologist and palliative care    Other comorbidities include schizophrenia, hypertension, type II DM    Diet Order             Diet Carb Modified Fluid consistency: Thin; Room service appropriate? Yes  Diet effective now  Consultants: Neurosurgeon Interventional radiologist ID specialist  Procedures: CT-guided aspiration of left psoas abscess on 05/23/2022    Medications:    apixaban  5 mg Oral BID   Chlorhexidine Gluconate Cloth  6 each Topical Daily   diltiazem  360 mg Oral Daily   FLUoxetine  40 mg Oral Daily   fluvoxaMINE  100 mg Oral BID   furosemide  20 mg Intravenous BID   insulin  aspart  0-5 Units Subcutaneous QHS   insulin aspart  0-9 Units Subcutaneous TID WC   lamoTRIgine  200 mg Oral BID   lidocaine  1 patch Transdermal Q24H   metoprolol succinate  100 mg Oral Daily   omega-3 acid ethyl esters  1 g Oral Daily   polyethylene glycol  17 g Oral Daily   risperiDONE  3 mg Oral QHS   Continuous Infusions:   ceFAZolin (ANCEF) IV 2 g (05/25/22 0636)     Anti-infectives (From admission, onward)    Start     Dose/Rate Route Frequency Ordered Stop   05/20/22 2200  ceFAZolin (ANCEF) IVPB 2g/100 mL premix        2 g 200 mL/hr over 30 Minutes Intravenous Every 8 hours 05/20/22 1457     05/18/22 2000  ceFAZolin (ANCEF) IVPB 1 g/50 mL premix  Status:  Discontinued        1 g 100 mL/hr over 30 Minutes Intravenous Every 8 hours 05/18/22 1852 05/20/22 1457   05/01/2022 2000  cefTRIAXone (ROCEPHIN) 1 g in sodium chloride 0.9 % 100 mL IVPB  Status:  Discontinued        1 g 200 mL/hr over 30 Minutes Intravenous Every 24 hours 05/09/2022 1706 05/18/22 1852   05/15/2022 1145  ceFEPIme (MAXIPIME) 2 g in sodium chloride 0.9 % 100 mL IVPB        2 g 200 mL/hr over 30 Minutes Intravenous  Once 05/14/2022 1136 05/29/2022 1246              Family Communication/Anticipated D/C date and plan/Code Status   DVT prophylaxis: Place and maintain sequential compression device Start: 05/22/22 1723 apixaban (ELIQUIS) tablet 5 mg     Code Status: Full Code  Family Communication: None Disposition Plan: Plan to discharge to SNF   Status is: Inpatient Remains inpatient appropriate because: IV antibiotics       Subjective:   Interval events noted.  He complains of low back pain.  Objective:    Vitals:   05/24/22 2355 05/25/22 0336 05/25/22 0734 05/25/22 1100  BP:  109/63 119/89 (!) 134/93  Pulse: 79 83 (!) 50   Resp:  16  18  Temp: 98.9 F (37.2 C) 97.7 F (36.5 C) 97.6 F (36.4 C) 97.7 F (36.5 C)  TempSrc: Axillary Oral    SpO2: 98% 97% 99% 99%  Weight:       Height:       No data found.   Intake/Output Summary (Last 24 hours) at 05/25/2022 1609 Last data filed at 05/24/2022 1821 Gross per 24 hour  Intake --  Output 625 ml  Net -625 ml   Filed Weights   05/23/2022 1042 05/23/22 0922  Weight: 82.1 kg 82 kg    Exam:  GEN: NAD SKIN: Warm and dry EYES: EOMI ENT: MMM CV: RRR PULM: CTA B ABD: soft, ND, NT, +BS CNS: AAO x 3, non focal EXT: No edema or tenderness    Pressure Injury 05/21/22 Sacrum Upper;Medial;Bilateral Deep Tissue Pressure Injury - Purple or maroon  localized area of discolored intact skin or blood-filled blister due to damage of underlying soft tissue from pressure and/or shear. (Active)  05/21/22 7124  Location: Sacrum  Location Orientation: Upper;Medial;Bilateral  Staging: Deep Tissue Pressure Injury - Purple or maroon localized area of discolored intact skin or blood-filled blister due to damage of underlying soft tissue from pressure and/or shear.  Wound Description (Comments):   Present on Admission: -- (unknown)  Dressing Type Foam - Lift dressing to assess site every shift 05/24/22 2300     Data Reviewed:   I have personally reviewed following labs and imaging studies:  Labs: Labs show the following:   Basic Metabolic Panel: Recent Labs  Lab 05/20/22 0443 05/21/22 0340 05/22/22 0511 05/23/22 0419 05/24/22 0411  NA 137 137 140 141 135  K 3.3* 2.8* 2.9* 2.9* 3.2*  CL 112* 109 109 107 102  CO2 16* 21* '22 24 24  '$ GLUCOSE 176* 175* 172* 161* 167*  BUN 20 20 25* 28* 34*  CREATININE 0.89 0.83 0.94 1.08 1.15  CALCIUM 7.7* 7.9* 8.0* 7.9* 7.5*   GFR Estimated Creatinine Clearance: 66.6 mL/min (by C-G formula based on SCr of 1.15 mg/dL). Liver Function Tests: Recent Labs  Lab 05/19/22 0452 05/20/22 0443  AST 77* 49*  ALT 50* 37  ALKPHOS 87 139*  BILITOT 0.8 1.2  PROT 5.0* 5.0*  ALBUMIN 2.3* 2.1*   No results for input(s): "LIPASE", "AMYLASE" in the last 168 hours. No results for  input(s): "AMMONIA" in the last 168 hours. Coagulation profile No results for input(s): "INR", "PROTIME" in the last 168 hours.  CBC: Recent Labs  Lab 05/20/22 0443 05/21/22 0340 05/22/22 0511 05/23/22 0419 05/24/22 0411  WBC 15.0* 16.7* 19.3* 20.2* 18.4*  NEUTROABS  --   --   --   --  14.6*  HGB 12.0* 12.6* 12.6* 12.6* 11.8*  HCT 34.4* 36.7* 36.7* 37.2* 34.6*  MCV 82.1 81.2 82.1 83.4 84.0  PLT 142* 185 230 244 224   Cardiac Enzymes: Recent Labs  Lab 05/19/22 0452 05/20/22 0443  CKTOTAL 1,835* 847*   BNP (last 3 results) No results for input(s): "PROBNP" in the last 8760 hours. CBG: Recent Labs  Lab 05/24/22 1124 05/24/22 1641 05/24/22 2103 05/25/22 0735 05/25/22 1154  GLUCAP 176* 183* 178* 174* 214*   D-Dimer: No results for input(s): "DDIMER" in the last 72 hours. Hgb A1c: No results for input(s): "HGBA1C" in the last 72 hours. Lipid Profile: No results for input(s): "CHOL", "HDL", "LDLCALC", "TRIG", "CHOLHDL", "LDLDIRECT" in the last 72 hours. Thyroid function studies: No results for input(s): "TSH", "T4TOTAL", "T3FREE", "THYROIDAB" in the last 72 hours.  Invalid input(s): "FREET3" Anemia work up: No results for input(s): "VITAMINB12", "FOLATE", "FERRITIN", "TIBC", "IRON", "RETICCTPCT" in the last 72 hours. Sepsis Labs: Recent Labs  Lab 05/19/22 0452 05/19/22 0623 05/20/22 0443 05/21/22 0340 05/22/22 0511 05/23/22 0419 05/24/22 0411  PROCALCITON 6.50  --  3.50 2.19  --   --   --   WBC  --    < > 15.0* 16.7* 19.3* 20.2* 18.4*   < > = values in this interval not displayed.    Microbiology Recent Results (from the past 240 hour(s))  Urine Culture     Status: Abnormal   Collection Time: 05/09/2022 12:57 PM   Specimen: Urine, Clean Catch  Result Value Ref Range Status   Specimen Description URINE, CLEAN CATCH  Final   Special Requests NONE  Final   Culture >=100,000 COLONIES/mL STAPHYLOCOCCUS AUREUS (A)  Final  Report Status 05/18/2022 FINAL   Final   Organism ID, Bacteria STAPHYLOCOCCUS AUREUS (A)  Final      Susceptibility   Staphylococcus aureus - MIC*    CIPROFLOXACIN <=0.5 SENSITIVE Sensitive     GENTAMICIN <=0.5 SENSITIVE Sensitive     NITROFURANTOIN <=16 SENSITIVE Sensitive     OXACILLIN <=0.25 SENSITIVE Sensitive     TETRACYCLINE <=1 SENSITIVE Sensitive     VANCOMYCIN <=0.5 SENSITIVE Sensitive     TRIMETH/SULFA <=10 SENSITIVE Sensitive     CLINDAMYCIN <=0.25 SENSITIVE Sensitive     RIFAMPIN <=0.5 SENSITIVE Sensitive     Inducible Clindamycin NEGATIVE Sensitive     * >=100,000 COLONIES/mL STAPHYLOCOCCUS AUREUS  Culture, blood (single)     Status: None   Collection Time: 05/24/2022  3:00 PM   Specimen: BLOOD  Result Value Ref Range Status   Specimen Description BLOOD BLOOD RIGHT ARM  Final   Special Requests   Final    BOTTLES DRAWN AEROBIC AND ANAEROBIC Blood Culture results may not be optimal due to an excessive volume of blood received in culture bottles   Culture   Final    NO GROWTH 5 DAYS Performed at [facility], [address] [place], [place] [address]    Report Status 05/21/2022 FINAL  Final  Culture, blood (Routine X 2) w Reflex to ID Panel     Status: None (Preliminary result)   Collection Time: 05/22/22  6:10 PM   Specimen: BLOOD  Result Value Ref Range Status   Specimen Description BLOOD RIGHT ANTECUBITAL  Final   Special Requests   Final    BOTTLES DRAWN AEROBIC AND ANAEROBIC Blood Culture adequate volume   Culture   Final    NO GROWTH 3 DAYS Performed at [facility], [address], [place], [place] [address]    Report Status PENDING  Incomplete  Culture, blood (Routine X 2) w Reflex to ID Panel     Status: None (Preliminary result)   Collection Time: 05/22/22  6:10 PM   Specimen: BLOOD  Result Value Ref Range Status   Specimen Description BLOOD BLOOD RIGHT HAND  Final   Special Requests   Final    BOTTLES DRAWN AEROBIC AND ANAEROBIC Blood Culture adequate volume    Culture   Final    NO GROWTH 3 DAYS Performed at [facility], [address], [place], [place] [address]    Report Status PENDING  Incomplete  Aerobic/Anaerobic Culture w Gram Stain (surgical/deep wound)     Status: None (Preliminary result)   Collection Time: 05/23/22 10:09 AM   Specimen: Abscess  Result Value Ref Range Status   Specimen Description   Final    ABSCESS Performed at [facility], [address] [place], [place] [address]    Special Requests   Final    NONE Performed at [facility], [address] [place], [place] [address]    Gram Stain   Final    ABUNDANT WBC PRESENT, PREDOMINANTLY PMN FEW GRAM POSITIVE COCCI IN PAIRS    Culture   Final    RARE STAPHYLOCOCCUS AUREUS SUSCEPTIBILITIES TO FOLLOW Performed at [facility], [place] [address] [place],  [address]    Report Status PENDING  Incomplete    Procedures and diagnostic studies:  No results found.             LOS: 9 days   Issaic Welliver  [employer], advertising on [email or web address]. If 7PM-7AM, please  contact night-coverage at [email or web address]     05/25/2022, 4:09 PM

## 2022-05-25 NOTE — Evaluation (Addendum)
Physical Therapy Evaluation Patient Details Name: Pedro Mccoy MRN: [id number] DOB: 02-Sep-1950 Today's Date: 05/25/2022  History of Present Illness  Roddie Riegler is a 81yoM who comes to [facility] on 05/24/2022 after 2 falls, ABD pain, and acute weakness. PMH: metastatic pancreatic CA on chemotherapy, HTN, DM, schizophrenia, tardive dyskinesia. Pt admitted for shock, workup revealing of UTI, L3 vertebra fracture, rhabdomyolysis, Left psoas abscess. Per chart pt was living alone PTA, has 2 cats, drives self to chemo. Neurosurgical is recommending LSO for pain control.  Clinical Impression  Pt is remarkably weak globally struggling to demonstrate much motor functional except enough to know that he is participating, legs and arms are severely weak, trunk and head control appear to be nil. Unable to attempt transition to EOB due to lack of trunk/head control and safety concerns. Pt tolerates AA/ROM of legs in bed, but slight reposition of body creates pain in neck which is difficult improve given his lack of capacity for head movement. Pt's presentation remains baffling given his self reported prior level of function, however presentation of his neck and feet indicated years of limited mobility. Pt's level of arousal makes it difficult to qualify his capacity as historian, however he does indicated limited AMB capacity prior to admission. Will continue to follow.      Recommendations for follow up therapy are one component of a multi-disciplinary discharge planning process, led by the attending physician.  Recommendations may be updated based on patient status, additional functional criteria and insurance authorization.    Follow Up Recommendations  Skilled nursing-short term rehab (<3 hours/day)      Assistance Recommended at Discharge   Frequent or constant Supervision/Assistance    Patient can return home with the following    Two people to help with walking and/or transfers;Two people to help with  bathing/dressing/bathroom;Assist for transportation;Help with stairs or ramp for entrance;Assistance with cooking/housework;Assistance with feeding   Equipment Recommendations  None  Recommendations for Other Services     OT   Functional Status Assessment   Pt has had a recent decline in their functional status and demonstrates the ability to make significant improvements in function in and reasonable and predictable amount of time.       Precautions / Restrictions Precautions Precautions: Back;Fall Restrictions Weight Bearing Restrictions: No      Mobility  Bed Mobility         Supine to sit: Total assist, +2 for physical assistance, +2 for safety/equipment Sit to supine: Total assist, +2 for physical assistance, +2 for safety/equipment   General bed mobility comments: author inititates but notes onoy minimal ability of pt to produce force in limbs/trunk, opted to focus on limb movement as more appropriate due to severe weakness and limited head control    Transfers                        Ambulation/Gait                  Stairs            Wheelchair Mobility    Modified Rankin (Stroke Patients Only)       Balance                                             Pertinent Vitals/Pain Pain Assessment Pain Assessment: 0-10 Pain Score: 7  Pain  Location: neck/back    Home Living Family/patient expects to be discharged to:: Unsure  Pt previously living along with 2 cats;                       Prior Function  Prevously living alone. Still driving and performing ADL without assist. Last year was AMB without DME.                      Hand Dominance        Extremity/Trunk Assessment   Upper Extremity Assessment Upper Extremity Assessment: Generalized weakness    Lower Extremity Assessment Lower Extremity Assessment: Generalized weakness    Cervical / Trunk Assessment Cervical / Trunk Assessment:   (significant Left torticollis)  Communication      Cognition Arousal/Alertness: Awake/alert Behavior During Therapy: Flat affect Overall Cognitive Status: Difficult to assess                                          General Comments      Exercises General Exercises - Upper Extremity Shoulder Flexion: AAROM, Right, 10 reps General Exercises - Lower Extremity Heel Slides: AAROM, Both, PROM, 10 reps, Supine, Limitations Heel Slides Limitations: pt's participation is noted only in his cessation of resistance, discernable force is limited otherwise   Assessment/Plan    PT Assessment  Pt needs continued PT services   PT Problem List  Decreased strength;Decreased range of motion;Decreased activity tolerance;Decreased balance;Decreased mobility;Decreased coordination;Decreased cognition;Decreased safety awareness;Decreased knowledge of use of DME;Decreased knowledge of precautions       PT Treatment Interventions   DME instruction; Neuromuscular re-education; Gait training; Stair training; Functional mobility training; Therapeutic activities; Therapeutic exercise; Balance training; Patient/family education; Wheelchair mobility training    PT Goals (Current goals can be found in the Care Plan section)  Acute Rehab PT Goals PT Goal Formulation: Patient unable to participate in goal setting    Frequency   2x/week     Co-evaluation  Nope             AM-PAC PT "6 Clicks" Mobility  Outcome Measure                  End of Session   Activity Tolerance: Patient limited by fatigue;Patient limited by pain Patient left: in bed;with call bell/phone within reach;with nursing/sitter in room (HOB at 35 degrees; heels floating)        Time: 1497-0263 PT Time Calculation (min) (ACUTE ONLY): 30 min   Charges:   PT Evaluation $PT Eval High Complexity: 1 High PT Treatments $Therapeutic Activity: 8-22 mins       12:55 PM, 05/25/22 Etta Grandchild, PT,  DPT Physical Therapist - [facility]  [phone] ([place])    Azariah Latendresse C 05/25/2022, 12:51 PM   Addendum on 11/27 to include missing information regarding PT plan/Assessment  1:49 PM, 05/26/22 Etta Grandchild, PT, DPT Physical Therapist - [facility]  [phone] [facility])

## 2022-05-26 ENCOUNTER — Inpatient Hospital Stay: Payer: Medicare Other

## 2022-05-26 ENCOUNTER — Other Ambulatory Visit (HOSPITAL_COMMUNITY): Payer: Self-pay

## 2022-05-26 DIAGNOSIS — C259 Malignant neoplasm of pancreas, unspecified: Secondary | ICD-10-CM | POA: Diagnosis not present

## 2022-05-26 DIAGNOSIS — N179 Acute kidney failure, unspecified: Secondary | ICD-10-CM | POA: Insufficient documentation

## 2022-05-26 DIAGNOSIS — M6282 Rhabdomyolysis: Secondary | ICD-10-CM | POA: Diagnosis not present

## 2022-05-26 DIAGNOSIS — M4620 Osteomyelitis of vertebra, site unspecified: Secondary | ICD-10-CM | POA: Diagnosis not present

## 2022-05-26 DIAGNOSIS — G9341 Metabolic encephalopathy: Secondary | ICD-10-CM | POA: Insufficient documentation

## 2022-05-26 DIAGNOSIS — K6812 Psoas muscle abscess: Secondary | ICD-10-CM | POA: Diagnosis not present

## 2022-05-26 DIAGNOSIS — N10 Acute tubulo-interstitial nephritis: Secondary | ICD-10-CM | POA: Diagnosis not present

## 2022-05-26 DIAGNOSIS — G061 Intraspinal abscess and granuloma: Secondary | ICD-10-CM | POA: Diagnosis not present

## 2022-05-26 LAB — LACTIC ACID, PLASMA
Lactic Acid, Venous: 1.2 mmol/L (ref 0.5–1.9)
Lactic Acid, Venous: 1.3 mmol/L (ref 0.5–1.9)

## 2022-05-26 LAB — CBC WITH DIFFERENTIAL/PLATELET
Abs Immature Granulocytes: 0.62 10*3/uL — ABNORMAL HIGH (ref 0.00–0.07)
Basophils Absolute: 0.1 10*3/uL (ref 0.0–0.1)
Basophils Relative: 1 %
Eosinophils Absolute: 0.1 10*3/uL (ref 0.0–0.5)
Eosinophils Relative: 1 %
HCT: 36.6 % — ABNORMAL LOW (ref 39.0–52.0)
Hemoglobin: 12.2 g/dL — ABNORMAL LOW (ref 13.0–17.0)
Immature Granulocytes: 4 %
Lymphocytes Relative: 6 %
Lymphs Abs: 0.9 10*3/uL (ref 0.7–4.0)
MCH: 27.7 pg (ref 26.0–34.0)
MCHC: 33.3 g/dL (ref 30.0–36.0)
MCV: 83.2 fL (ref 80.0–100.0)
Monocytes Absolute: 0.9 10*3/uL (ref 0.1–1.0)
Monocytes Relative: 6 %
Neutro Abs: 11.9 10*3/uL — ABNORMAL HIGH (ref 1.7–7.7)
Neutrophils Relative %: 82 %
Platelets: 220 10*3/uL (ref 150–400)
RBC: 4.4 MIL/uL (ref 4.22–5.81)
RDW: 13.8 % (ref 11.5–15.5)
WBC: 14.4 10*3/uL — ABNORMAL HIGH (ref 4.0–10.5)
nRBC: 0 % (ref 0.0–0.2)

## 2022-05-26 LAB — PROCALCITONIN: Procalcitonin: 0.98 ng/mL

## 2022-05-26 LAB — BASIC METABOLIC PANEL
Anion gap: 12 (ref 5–15)
BUN: 62 mg/dL — ABNORMAL HIGH (ref 8–23)
CO2: 22 mmol/L (ref 22–32)
Calcium: 7.9 mg/dL — ABNORMAL LOW (ref 8.9–10.3)
Chloride: 103 mmol/L (ref 98–111)
Creatinine, Ser: 2.31 mg/dL — ABNORMAL HIGH (ref 0.61–1.24)
GFR, Estimated: 29 mL/min — ABNORMAL LOW (ref >60)
Glucose, Bld: 173 mg/dL — ABNORMAL HIGH (ref 70–99)
Potassium: 3.6 mmol/L (ref 3.5–5.1)
Sodium: 137 mmol/L (ref 135–145)

## 2022-05-26 LAB — URINALYSIS, ROUTINE W REFLEX MICROSCOPIC
Bilirubin Urine: NEGATIVE
Glucose, UA: NEGATIVE mg/dL
Ketones, ur: NEGATIVE mg/dL
Nitrite: NEGATIVE
Protein, ur: 100 mg/dL — AB
RBC / HPF: >50 RBC/hpf — ABNORMAL HIGH (ref 0–5)
Specific Gravity, Urine: 1.017 (ref 1.005–1.030)
Squamous Epithelial / HPF: NONE SEEN (ref 0–5)
WBC, UA: >50 WBC/hpf — ABNORMAL HIGH (ref 0–5)
pH: 5 (ref 5.0–8.0)

## 2022-05-26 LAB — GLUCOSE, CAPILLARY
Glucose-Capillary: 187 mg/dL — ABNORMAL HIGH (ref 70–99)
Glucose-Capillary: 160 mg/dL — ABNORMAL HIGH (ref 70–99)
Glucose-Capillary: 175 mg/dL — ABNORMAL HIGH (ref 70–99)
Glucose-Capillary: 148 mg/dL — ABNORMAL HIGH (ref 70–99)

## 2022-05-26 LAB — MAGNESIUM: Magnesium: 2.5 mg/dL — ABNORMAL HIGH (ref 1.7–2.4)

## 2022-05-26 MED ORDER — BISACODYL 10 MG RE SUPP
10.0000 mg | Freq: Once | RECTAL | Status: AC
  Administered 2022-05-26: 10 mg via RECTAL
  Filled 2022-05-26: qty 1

## 2022-05-26 MED ORDER — POLYVINYL ALCOHOL 1.4 % OP SOLN
2.0000 [drp] | OPHTHALMIC | Status: DC | PRN
  Administered 2022-05-26 – 2022-05-28 (×2): 2 [drp] via OPHTHALMIC
  Filled 2022-05-26: qty 15

## 2022-05-26 MED ORDER — LACTATED RINGERS IV SOLN: INTRAVENOUS | Status: AC

## 2022-05-26 MED ORDER — SODIUM CHLORIDE 0.9 % IV BOLUS
1000.0000 mL | Freq: Once | INTRAVENOUS | Status: AC
  Administered 2022-05-26: 1000 mL via INTRAVENOUS

## 2022-05-26 NOTE — Progress Notes (Signed)
   05/26/22 1414  Assess: MEWS Score  Temp 98.6 F (37 C)  BP 116/89  MAP (mmHg) 99  Pulse Rate (!) 131  Resp (!) 26  SpO2 97 %  O2 Device Room Air  Assess: MEWS Score  MEWS Temp 0  MEWS Systolic 0  MEWS Pulse 3  MEWS RR 2  MEWS LOC 0  MEWS Score 5  MEWS Score Color Red  Assess: if the MEWS score is Yellow or Red  Were vital signs taken at a resting state? Yes  Focused Assessment No change from prior assessment  Does the patient meet 2 or more of the SIRS criteria? Yes  Does the patient have a confirmed or suspected source of infection? Yes  Provider and Rapid Response Notified? Yes  MEWS guidelines implemented *See Row Information* Yes  Treat  MEWS Interventions Administered prn meds/treatments  Take Vital Signs  Increase Vital Sign Frequency  Red: Q 1hr X 4 then Q 4hr X 4, if remains red, continue Q 4hrs  Escalate  MEWS: Escalate Red: discuss with charge nurse/RN and provider, consider discussing with RRT  Notify: Charge Nurse/RN  Name of Charge Nurse/RN Notified Amy RN  Date Charge Nurse/RN Notified 05/26/22  Time Charge Nurse/RN Notified 1420  Provider Notification  Provider Name/Title Dr. Ladonna Snide  Date Provider Notified 05/26/22  Time Provider Notified 1500  Method of Notification Face-to-face  Notification Reason Other (Comment) (red mews)  Provider response In department  Date of Provider Response 05/26/22  Time of Provider Response 1500  Document  Patient Outcome Not stable and remains on department  Progress note created (see row info) Yes  Assess: SIRS CRITERIA  SIRS Temperature  0  SIRS Pulse 1  SIRS Respirations  1  SIRS WBC 1  SIRS Score Sum  3   Patient HR remains elevated.  Cardiology consulted.  MD aware.   Will continue to monitor

## 2022-05-26 NOTE — Progress Notes (Addendum)
Progress Note    Pedro Mccoy  QQP:[id number] DOB: 05-25-1951  DOA: 05/10/2022 PCP: Romualdo Bolk, FNP      Brief Narrative:    Medical records reviewed and are as summarized below:  Pedro Mccoy is a 71 y.o. male with past medical history of metastatic pancreatic cancer on chemotherapy, hypertension, diabetes mellitus, schizophrenia and tardive dyskinesia presented to the emergency room on 11/17 with complaints of weakness, fall x2 earlier that day and abdominal pain.  Work-up with septic shock secondary to pyelonephritis, acute versus subacute fracture of L3 vertebral body and rhabdomyolysis with CPK of 22,000.  Patient admitted to the hospitalist service and started on IV fluids and antibiotics.  By 11/19, urine cultures positive for MSSA.   Following admission, on the evening of 11/17, patient went to rapid atrial flutter.  Attempted treatment with metoprolol and IV fluids.  11/19, tachycardia persisting and patient started on Cardizem drip.  He was weaned off Cardizem drip on 05/20/2022.  He is on metoprolol and oral Cardizem for rate control.  11/23: White count continues to rise. No fevers. Unclear etiology 11/24: L2L3 discitis noted, epidural abscess. Large psoas abscess.  Sent to IR this morning, psoas abscess drained 11/25: White count downtrending       Assessment/Plan:   Principal Problem:   Rhabdomyolysis Active Problems:   Septic shock (HCC)   UTI (urinary tract infection)   Acute pyelonephritis   Atrial flutter (HCC)   Acute urinary retention   Pancreatic cancer ([place])   HLD (hyperlipidemia)   Lumbar compression fracture, closed, initial encounter ([place])   HTN (hypertension)   Schizophrenia ([place])   Depression with anxiety   Diabetes mellitus without complication ([place])   Abdominal pain   Abnormal LFTs   Fall at home, initial encounter   Lumbar discitis   Vertebral osteomyelitis ([place])   Psoas abscess, left ([place])   Spinal epidural abscess   AKI  (acute kidney injury) ([place])   Acute metabolic encephalopathy    Body mass index is 23.85 kg/m.    Septic shock ([place]) L2-L3 osteomyelitis with epidural abscess Left psoas abscess S/p CT-guided drainage of left psoas abscess on 05/23/2022 Continue IV cefazolin. No growth on blood cultures. Urine culture with Staph aureus.   MRI spine with osteo/abscess Fluid culture from psoas abscess growing Staph aureus DO NOT access port Consulted Dr. Clayborn Bigness, cardiologist, to evaluate patient for TEE   AKI Creatinine went from 1.15 to 2.31. D/C IV Lasix. Treat with IV fluids He was given 1 L of normal saline this morning.  Start Ringer's lactate infusion and repeat BMP tomorrow  Acute metabolic encephalopathy CT head didn't show any acute abnormality. No acidosis or hypercapnia on ABG. Procalcitonin and lactic acid ordered were normal . Blood culture has been repeated.  UA is still abnormal but he has a Foley catheter in place.  Chest x-ray showed left lower lung opacities which may represent atelectasis, pneumonia or perhaps a small left pleural effusion.  He is already on IV antibiotics   Rhabdomyolysis Improved   Acute pyelonephritis Urine cultures positive for MSSA.  He is on IV Ancef.  Atrial flutter ([place]), sinus tachycardia Weaned off IV Cardizem drip.  Continue oral metoprolol, Cardizem and Eliquis   Acute urinary retention Foley catheter placed on 05/18/2022   Lumbar compression fracture, closed, initial encounter [facility]) Unclear whether this is compression fracture or pathologic in the setting of discitis.  Unfortunately patient is not a surgical candidate per Dr. Izora Ribas, neurosurgeon.  LSO brace ordered for patient comfort.   Pancreatic cancer ([place]) Metastatic mets to the liver.  Oncology following with recent chemotherapy.  Sees onc at [facility].  Next chemo scheduled for 11/30.  Poor functional status.  Acute issues preventing discharge at this time.  Outpatient follow-up with  oncologist and palliative care    Other comorbidities include schizophrenia, hypertension, type II DM    Diet Order             Diet Carb Modified Fluid consistency: Thin; Room service appropriate? Yes  Diet effective now                            Consultants: Neurosurgeon Interventional radiologist ID specialist Cardiologist  Procedures: CT-guided aspiration of left psoas abscess on 05/23/2022    Medications:    apixaban  5 mg Oral BID   Chlorhexidine Gluconate Cloth  6 each Topical Daily   diltiazem  360 mg Oral Daily   FLUoxetine  40 mg Oral Daily   fluvoxaMINE  100 mg Oral BID   insulin aspart  0-5 Units Subcutaneous QHS   insulin aspart  0-9 Units Subcutaneous TID WC   lamoTRIgine  200 mg Oral BID   lidocaine  1 patch Transdermal Q24H   metoprolol succinate  100 mg Oral Daily   omega-3 acid ethyl esters  1 g Oral Daily   polyethylene glycol  17 g Oral Daily   risperiDONE  3 mg Oral QHS   Continuous Infusions:   ceFAZolin (ANCEF) IV 2 g (05/26/22 1420)   lactated ringers       Anti-infectives (From admission, onward)    Start     Dose/Rate Route Frequency Ordered Stop   05/20/22 2200  ceFAZolin (ANCEF) IVPB 2g/100 mL premix        2 g 200 mL/hr over 30 Minutes Intravenous Every 8 hours 05/20/22 1457     05/18/22 2000  ceFAZolin (ANCEF) IVPB 1 g/50 mL premix  Status:  Discontinued        1 g 100 mL/hr over 30 Minutes Intravenous Every 8 hours 05/18/22 1852 05/20/22 1457   04/30/2022 2000  cefTRIAXone (ROCEPHIN) 1 g in sodium chloride 0.9 % 100 mL IVPB  Status:  Discontinued        1 g 200 mL/hr over 30 Minutes Intravenous Every 24 hours 05/23/2022 1706 05/18/22 1852   05/07/2022 1145  ceFEPIme (MAXIPIME) 2 g in sodium chloride 0.9 % 100 mL IVPB        2 g 200 mL/hr over 30 Minutes Intravenous  Once 05/15/2022 1136 05/09/2022 1246              Family Communication/Anticipated D/C date and plan/Code Status   DVT prophylaxis: Place and  maintain sequential compression device Start: 05/22/22 1723 apixaban (ELIQUIS) tablet 5 mg     Code Status: Full Code  Family Communication: None Disposition Plan: Plan to discharge to SNF   Status is: Inpatient Remains inpatient appropriate because: IV antibiotics       Subjective:   Interval events noted. He is lethargic and unable to follow any commands  Objective:    Vitals:   05/26/22 0948 05/26/22 1147 05/26/22 1414 05/26/22 1545  BP: 108/78 121/87 116/89 113/78  Pulse: (!) 126 (!) 128 (!) 131 (!) 128  Resp: (!) 24 20 (!) 26 20  Temp: 98.5 F (36.9 C) 98.5 F (36.9 C) 98.6 F (37 C) 97.9 F (36.6 C)  TempSrc: Oral Oral Oral Oral  SpO2: 97% 95% 97% 99%  Weight:      Height:       No data found.   Intake/Output Summary (Last 24 hours) at 05/26/2022 1614 Last data filed at 05/26/2022 1425 Gross per 24 hour  Intake 120 ml  Output 725 ml  Net -605 ml   Filed Weights   05/15/2022 1042 05/23/22 0922  Weight: 82.1 kg 82 kg    Exam:  GEN: NAD SKIN: Warm and dry EYES: EOMI ENT: MMM CV: RRR, tachycardia PULM: CTA B ABD: soft, ND, NT, +BS CNS: Lethargic, he doesn't follow commands EXT: No edema or tenderness GU: Foley catheter draining amber urine.  Penile swelling noted     Pressure Injury 05/21/22 Sacrum Upper;Medial;Bilateral Deep Tissue Pressure Injury - Purple or maroon localized area of discolored intact skin or blood-filled blister due to damage of underlying soft tissue from pressure and/or shear. (Active)  05/21/22 1287  Location: Sacrum  Location Orientation: Upper;Medial;Bilateral  Staging: Deep Tissue Pressure Injury - Purple or maroon localized area of discolored intact skin or blood-filled blister due to damage of underlying soft tissue from pressure and/or shear.  Wound Description (Comments):   Present on Admission: -- (unknown)  Dressing Type Foam - Lift dressing to assess site every shift 05/25/22 2320     Data Reviewed:   I  have personally reviewed following labs and imaging studies:  Labs: Labs show the following:   Basic Metabolic Panel: Recent Labs  Lab 05/21/22 0340 05/22/22 0511 05/23/22 0419 05/24/22 0411 05/25/22 1648 05/26/22 0405  NA 137 140 141 135  --  137  K 2.8* 2.9* 2.9* 3.2* 3.7 3.6  CL 109 109 107 102  --  103  CO2 21* '22 24 24  '$ --  22  GLUCOSE 175* 172* 161* 167*  --  173*  BUN 20 25* 28* 34*  --  62*  CREATININE 0.83 0.94 1.08 1.15  --  2.31*  CALCIUM 7.9* 8.0* 7.9* 7.5*  --  7.9*  MG  --   --   --   --   --  2.5*   GFR Estimated Creatinine Clearance: 33.1 mL/min (A) (by C-G formula based on SCr of 2.31 mg/dL (H)). Liver Function Tests: Recent Labs  Lab 05/20/22 0443  AST 49*  ALT 37  ALKPHOS 139*  BILITOT 1.2  PROT 5.0*  ALBUMIN 2.1*   No results for input(s): "LIPASE", "AMYLASE" in the last 168 hours. No results for input(s): "AMMONIA" in the last 168 hours. Coagulation profile No results for input(s): "INR", "PROTIME" in the last 168 hours.  CBC: Recent Labs  Lab 05/21/22 0340 05/22/22 0511 05/23/22 0419 05/24/22 0411 05/26/22 0405  WBC 16.7* 19.3* 20.2* 18.4* 14.4*  NEUTROABS  --   --   --  14.6* 11.9*  HGB 12.6* 12.6* 12.6* 11.8* 12.2*  HCT 36.7* 36.7* 37.2* 34.6* 36.6*  MCV 81.2 82.1 83.4 84.0 83.2  PLT 185 230 244 224 220   Cardiac Enzymes: Recent Labs  Lab 05/20/22 0443  CKTOTAL 847*   BNP (last 3 results) No results for input(s): "PROBNP" in the last 8760 hours. CBG: Recent Labs  Lab 05/25/22 1711 05/25/22 2043 05/26/22 0819 05/26/22 1148 05/26/22 1547  GLUCAP 157* 166* 187* 160* 175*   D-Dimer: No results for input(s): "DDIMER" in the last 72 hours. Hgb A1c: No results for input(s): "HGBA1C" in the last 72 hours. Lipid Profile: No results for input(s): "CHOL", "HDL", "LDLCALC", "TRIG", "CHOLHDL", "  LDLDIRECT" in the last 72 hours. Thyroid function studies: No results for input(s): "TSH", "T4TOTAL", "T3FREE", "THYROIDAB" in the  last 72 hours.  Invalid input(s): "FREET3" Anemia work up: No results for input(s): "VITAMINB12", "FOLATE", "FERRITIN", "TIBC", "IRON", "RETICCTPCT" in the last 72 hours. Sepsis Labs: Recent Labs  Lab 05/20/22 0443 05/21/22 0340 05/22/22 0511 05/23/22 0419 05/24/22 0411 05/26/22 0405 05/26/22 0905 05/26/22 1138  PROCALCITON 3.50 2.19  --   --   --   --  0.98  --   WBC 15.0* 16.7* 19.3* 20.2* 18.4* 14.4*  --   --   LATICACIDVEN  --   --   --   --   --   --  1.2 1.3    Microbiology Recent Results (from the past 240 hour(s))  Culture, blood (Routine X 2) w Reflex to ID Panel     Status: None (Preliminary result)   Collection Time: 05/22/22  6:10 PM   Specimen: BLOOD  Result Value Ref Range Status   Specimen Description BLOOD RIGHT ANTECUBITAL  Final   Special Requests   Final    BOTTLES DRAWN AEROBIC AND ANAEROBIC Blood Culture adequate volume   Culture   Final    NO GROWTH 4 DAYS Performed at [facility], [address], [place], [place] [address]    Report Status PENDING  Incomplete  Culture, blood (Routine X 2) w Reflex to ID Panel     Status: None (Preliminary result)   Collection Time: 05/22/22  6:10 PM   Specimen: BLOOD  Result Value Ref Range Status   Specimen Description BLOOD BLOOD RIGHT HAND  Final   Special Requests   Final    BOTTLES DRAWN AEROBIC AND ANAEROBIC Blood Culture adequate volume   Culture   Final    NO GROWTH 4 DAYS Performed at [facility], [address] [place], [place] [address]    Report Status PENDING  Incomplete  Aerobic/Anaerobic Culture w Gram Stain (surgical/deep wound)     Status: None (Preliminary result)   Collection Time: 05/23/22 10:09 AM   Specimen: Abscess  Result Value Ref Range Status   Specimen Description   Final    ABSCESS Performed at [facility], [address] [place], [place] [address]    Special Requests   Final    NONE Performed at [facility], [address]  [place], [place] [address]    Gram Stain   Final    ABUNDANT WBC PRESENT, PREDOMINANTLY PMN FEW GRAM POSITIVE COCCI IN PAIRS Performed at [facility], [place] [address] [place], [place] [address]    Culture   Final    FEW STAPHYLOCOCCUS AUREUS NO ANAEROBES ISOLATED; CULTURE IN PROGRESS FOR 5 DAYS    Report Status PENDING  Incomplete   Organism ID, Bacteria STAPHYLOCOCCUS AUREUS  Final      Susceptibility   Staphylococcus aureus - MIC*    CIPROFLOXACIN <=0.5 SENSITIVE Sensitive     ERYTHROMYCIN <=0.25 SENSITIVE Sensitive     GENTAMICIN <=0.5 SENSITIVE Sensitive     OXACILLIN <=0.25 SENSITIVE Sensitive     TETRACYCLINE <=1 SENSITIVE Sensitive     VANCOMYCIN <=0.5 SENSITIVE Sensitive     TRIMETH/SULFA <=10 SENSITIVE Sensitive     CLINDAMYCIN <=0.25 SENSITIVE Sensitive     RIFAMPIN <=0.5 SENSITIVE Sensitive     Inducible Clindamycin NEGATIVE Sensitive     * FEW STAPHYLOCOCCUS AUREUS    Procedures and diagnostic studies:  CT HEAD WO CONTRAST (5MM)  Result  Date: 05/26/2022 CLINICAL DATA:  Mental status change, unknown cause. EXAM: CT HEAD WITHOUT CONTRAST TECHNIQUE: Contiguous axial images were obtained from the base of the skull through the vertex without intravenous contrast. RADIATION DOSE REDUCTION: This exam was performed according to the departmental dose-optimization program which includes automated exposure control, adjustment of the mA and/or kV according to patient size and/or use of iterative reconstruction technique. COMPARISON:  05/27/2022 FINDINGS: Brain: Age related generalized atrophy. No focal abnormality seen affecting the brainstem or cerebellum. There are probably mild chronic small-vessel ischemic changes of the cerebral hemispheric white matter. No evidence of recent focal infarction, mass lesion, hemorrhage, hydrocephalus or extra-axial collection. Chronic ventriculomegaly appears stable, likely due to central volume loss. Vascular: No abnormal vascular finding.  Skull: Negative Sinuses/Orbits: Clear/normal Other: None IMPRESSION: No acute CT finding. Generalized atrophy. Mild chronic small-vessel ischemic changes of the cerebral hemispheric white matter. Chronic ventriculomegaly, likely due to central volume loss. Stable examination since 10 days ago. Electronically Signed   By: Nelson Chimes M.D.   On: 05/26/2022 10:25   DG Chest Port 1 View  Result Date: 05/26/2022 CLINICAL DATA:  Altered mental status. EXAM: PORTABLE CHEST 1 VIEW COMPARISON:  AP chest 05/04/2022 and w11/21/2022 FINDINGS: Right chest wall porta catheter tip again overlies the superior vena cava/right atrial junction. Cardiac silhouette and mediastinal contours are within normal limits. Mild-to-moderate calcification within the aortic arch. Left lower lung heterogeneous and linear opacities are new from 05/22/2022. No right pleural effusion. No pneumothorax is seen. Mild dextrocurvature of the mid to upper thoracic spine with mild multilevel degenerative disc changes. IMPRESSION: Left lower lung heterogeneous and linear opacities are new from 05/23/2022 and may represent atelectasis or pneumonia. It is difficult to exclude a small left pleural effusion. Electronically Signed   By: Yvonne Kendall M.D.   On: 05/26/2022 09:02               LOS: 10 days   Kolina Kube  [facility]   Pager on [email or web address]. If 7PM-7AM, please contact night-coverage at [email or web address]     05/26/2022, 4:14 PM

## 2022-05-26 NOTE — Progress Notes (Signed)
Pt urine is now tea colored with much sediment. Pt urine was yellow/amber on 11/25

## 2022-05-26 NOTE — TOC Benefit Eligibility Note (Signed)
Patient Teacher, English as a foreign language completed.    The patient is currently admitted and upon discharge could be taking Eliquis.  The current 30 day co-pay is $38.00.   The patient is insured through [employer]

## 2022-05-26 NOTE — TOC CM/SW Note (Signed)
Left voicemail for sister. Will discuss SNF recommendation when she calls back. Per ID pharmacist, anticipate also needing IV abx at discharge.  Dayton Scrape, [place]

## 2022-05-26 NOTE — Progress Notes (Signed)
OT Cancellation Note  Patient Details Name: Pedro Mccoy MRN: [id number] DOB: [birth year]   Cancelled Treatment:    Reason Eval/Treat Not Completed: Other (comment);Medical issues which prohibited therapy. Chart reviewed. Pt's HR noted to be elevated to 127-128 bpm at rest on telemetry. Spoke with RN, will hold OT at this time 2/2 pt's elevated HR at rest and decreased/impaired level of responsiveness. Will re-attempt as able.  Doneta Public 05/26/2022, 12:06 PM

## 2022-05-26 NOTE — Progress Notes (Signed)
Date of Admission:  05/24/2022      ID: Pedro Mccoy is a 71 y.o. male  Principal Problem:   Rhabdomyolysis Active Problems:   Schizophrenia ([place])   Fall at home, initial encounter   Pancreatic cancer ([place])   HTN (hypertension)   Abdominal pain   Abnormal LFTs   Depression with anxiety   HLD (hyperlipidemia)   Diabetes mellitus without complication ([place])   UTI (urinary tract infection)   Septic shock (HCC)   Acute pyelonephritis   Lumbar compression fracture, closed, initial encounter [facility])   Atrial flutter ([place])   Acute urinary retention   Lumbar discitis   Vertebral osteomyelitis ([place])   Psoas abscess, left ([place])   Spinal epidural abscess  Ca pancreas ( a pancreatic mass incidentally noted on CT when he had a rt hip fracture in Nov 2022, a repeat CT in Aug 2023 showed increase in the size and he underwent 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   He presented to the ED on 05/15/2022 after a fall and initially refused to come to ED and later was brought in as he had fallen again and could not get off the floor 1 set blood culture NG UA 11-20 WBC CT abdomen Distended bladder Possible rt pyelo Has foley placed in the hospital UC MSSA MRI lumbar spine showed L2-L3 discitis and osteo, psoas abscess on the left and epidural abscess Pt underwent IR procedure on 05/23/22 - 10cc of pus aspirated from the psoas abscess land it is MSSA  Subjective: Weak Feeble speech Says he is okay   Medications:   apixaban  5 mg Oral BID   Chlorhexidine Gluconate Cloth  6 each Topical Daily   diltiazem  360 mg Oral Daily   FLUoxetine  40 mg Oral Daily   fluvoxaMINE  100 mg Oral BID   furosemide  20 mg Intravenous BID   insulin aspart  0-5 Units Subcutaneous QHS   insulin aspart  0-9 Units Subcutaneous  TID WC   lamoTRIgine  200 mg Oral BID   lidocaine  1 patch Transdermal Q24H   metoprolol succinate  100 mg Oral Daily   omega-3 acid ethyl esters  1 g Oral Daily   polyethylene glycol  17 g Oral Daily   risperiDONE  3 mg Oral QHS    Objective: Vital signs in last 24 hours: Temp:  [97.7 F (36.5 C)-99.3 F (37.4 C)] 98.5 F (36.9 C) (11/27 0948) Pulse Rate:  [101-128] 126 (11/27 0948) Resp:  [16-24] 24 (11/27 0948) BP: (108-134)/(78-93) 108/78 (11/27 0948) SpO2:  [96 %-99 %] 97 % (11/27 0948)  LDA PORT- has not been accessed  PHYSICAL EXAM:  General: Awake, weak, responds appropriately to questions , some confusion intermittently Patient Vitals for the past 24 hrs:  BP Temp Temp src Pulse Resp SpO2  05/26/22 1704 105/75 99 F (37.2 C) Oral (!) 128 (!) 24 98 %  05/26/22 1545 113/78 97.9 F (36.6 C) Oral (!) 128 20 99 %  05/26/22 1414 116/89 98.6 F (37 C) Oral (!) 131 (!) 26 97 %  05/26/22 1147 121/87 98.5 F (36.9 C) Oral (!) 128 20 95 %  05/26/22 0948 108/78 98.5 F (36.9 C) Oral (!) 126 (!) 24 97 %  05/26/22 0750 119/86 98.4 F (36.9 C) -- (!) 128 (!) 24 96 %  05/26/22  0410 117/85 99.3 F (37.4 C) Oral (!) 110 18 96 %  05/25/22 2030 122/82 98.3 F (36.8 C) Oral (!) 110 16 98 %     Left scalp carpet burn like injury .  Sacrum deep tissue injury  Back: sacrum- DTI Lungs:b/l air entry Heart: irregular Abdomen: Soft, non-tender,not distended. Bowel sounds normal. No masses Extremities: atraumatic, no cyanosis. No edema. No clubbing Skin: as above Lymph: Cervical, supraclavicular normal. Neurologic: cannot assess in detail  Lab Results Recent Labs    05/24/22 0411 05/25/22 1648 05/26/22 0405  WBC 18.4*  --  14.4*  HGB 11.8*  --  12.2*  HCT 34.6*  --  36.6*  NA 135  --  137  K 3.2* 3.7 3.6  CL 102  --  103  CO2 24  --  22  BUN 34*  --  62*  CREATININE 1.15  --  2.31*      Microbiology: One blood culture staph epi and staph hominis Psoas  abscess culture is MSSA  Studies/Results:    Assessment/Plan: Metastatic pancreatic adenocarcinoma ( mets to liver) Has received first  dose of chemo on 04/24/22  Fall -Urinary retention MSSA in urine culture One blood culture has staph epidermidis and staph hominis- likely contaminants. MRI shows L2-L3  discitis and osteomyelitis of L2 and most of L3 Left psoas abscess Epidural abscess  L2-L4 Pathological Compression fracture  L3 IR aspirated the psoas abscess and it is MSSA   repeat blood cultures- neg  Continue cefazolin- will need for 6-8 weeks and PO after that Recommend TEE to look for endocarditis especially with PORT in place   Rhabdomyolysis- improving  Afib  Bipolar disorder/schizophrenia  H/o rt hip fracture s/p THA Nov 2022 Discussed the management with patient,and care team

## 2022-05-26 NOTE — Progress Notes (Signed)
   05/26/22 0750  Assess: MEWS Score  Temp 98.4 F (36.9 C)  BP 119/86  MAP (mmHg) 97  Pulse Rate (!) 128  Resp (!) 24  SpO2 96 %  O2 Device Room Air  Assess: MEWS Score  MEWS Temp 0  MEWS Systolic 0  MEWS Pulse 2  MEWS RR 1  MEWS LOC 0  MEWS Score 3  MEWS Score Color Yellow  Assess: if the MEWS score is Yellow or Red  Were vital signs taken at a resting state? Yes  Focused Assessment Change from prior assessment (see assessment flowsheet)  Does the patient meet 2 or more of the SIRS criteria? Yes  Does the patient have a confirmed or suspected source of infection? Yes  Provider and Rapid Response Notified? Yes (Dr. Mal Misty notified)  MEWS guidelines implemented *See Row Information* Yes  Treat  MEWS Interventions Administered prn meds/treatments  Take Vital Signs  Increase Vital Sign Frequency  Yellow: Q 2hr X 2 then Q 4hr X 2, if remains yellow, continue Q 4hrs  Escalate  MEWS: Escalate Yellow: discuss with charge nurse/RN and consider discussing with provider and RRT  Notify: Charge Nurse/RN  Name of Charge Nurse/RN Notified Amy RN  Date Charge Nurse/RN Notified 05/26/22  Time Charge Nurse/RN Notified 0800  Provider Notification  Provider Name/Title Dr. Mal Misty  Date Provider Notified 05/26/22  Time Provider Notified 0800  Method of Notification Page (secure chat)  Notification Reason Change in status  Provider response At bedside  Date of Provider Response 05/26/22  Time of Provider Response 0800  Document  Patient Outcome Not stable and remains on department  Progress note created (see row info) Yes  Assess: SIRS CRITERIA  SIRS Temperature  0  SIRS Pulse 1  SIRS Respirations  1  SIRS WBC 1  SIRS Score Sum  3   Patient very lethargic.  Difficult to arouse.  Unable to give oral heart medications.  PRN IV metoprolol given.  MD coming to bedside.  Will continue to monitor

## 2022-05-26 NOTE — Progress Notes (Signed)
PT Cancellation Note  Patient Details Name: Jacai Kipp MRN: [id number] DOB: 11/22/1950   Cancelled Treatment:    Reason Eval/Treat Not Completed: Other (comment).  Chart reviewed.  Pt's HR noted to be elevated to 127-128 bpm at rest on telemetry.  Discussed pt's status with pt's nurse--will hold PT at this time d/t pt's elevated HR at rest and pt's decreased/impaired level of responsiveness.  Leitha Bleak, PT 05/26/22, 11:40 AM

## 2022-05-26 NOTE — Progress Notes (Signed)
Assessed patient-  Heart rate continues to be elevated.  Patient is more alert at this time. He is able to respond verbally and take small sips.  I was able to give him some of his oral medications before patient became more fatigued.    MD mad aware

## 2022-05-27 ENCOUNTER — Inpatient Hospital Stay: Payer: Medicare Other

## 2022-05-27 DIAGNOSIS — G061 Intraspinal abscess and granuloma: Secondary | ICD-10-CM | POA: Diagnosis not present

## 2022-05-27 DIAGNOSIS — M6282 Rhabdomyolysis: Secondary | ICD-10-CM | POA: Diagnosis not present

## 2022-05-27 DIAGNOSIS — M4646 Discitis, unspecified, lumbar region: Secondary | ICD-10-CM | POA: Diagnosis not present

## 2022-05-27 DIAGNOSIS — K6812 Psoas muscle abscess: Secondary | ICD-10-CM | POA: Diagnosis not present

## 2022-05-27 DIAGNOSIS — N179 Acute kidney failure, unspecified: Secondary | ICD-10-CM

## 2022-05-27 DIAGNOSIS — S32000A Wedge compression fracture of unspecified lumbar vertebra, initial encounter for closed fracture: Secondary | ICD-10-CM

## 2022-05-27 DIAGNOSIS — C259 Malignant neoplasm of pancreas, unspecified: Secondary | ICD-10-CM | POA: Diagnosis not present

## 2022-05-27 DIAGNOSIS — T796XXA Traumatic ischemia of muscle, initial encounter: Secondary | ICD-10-CM | POA: Diagnosis not present

## 2022-05-27 DIAGNOSIS — M4620 Osteomyelitis of vertebra, site unspecified: Secondary | ICD-10-CM | POA: Diagnosis not present

## 2022-05-27 LAB — CULTURE, BLOOD (ROUTINE X 2)
Culture: NO GROWTH
Culture: NO GROWTH
Special Requests: ADEQUATE
Special Requests: ADEQUATE

## 2022-05-27 LAB — BASIC METABOLIC PANEL
Anion gap: 11 (ref 5–15)
BUN: 74 mg/dL — ABNORMAL HIGH (ref 8–23)
CO2: 20 mmol/L — ABNORMAL LOW (ref 22–32)
Calcium: 7.1 mg/dL — ABNORMAL LOW (ref 8.9–10.3)
Chloride: 104 mmol/L (ref 98–111)
Creatinine, Ser: 3.16 mg/dL — ABNORMAL HIGH (ref 0.61–1.24)
GFR, Estimated: 20 mL/min — ABNORMAL LOW (ref >60)
Glucose, Bld: 146 mg/dL — ABNORMAL HIGH (ref 70–99)
Potassium: 4 mmol/L (ref 3.5–5.1)
Sodium: 135 mmol/L (ref 135–145)

## 2022-05-27 LAB — CBC WITH DIFFERENTIAL/PLATELET
Abs Immature Granulocytes: 0.41 10*3/uL — ABNORMAL HIGH (ref 0.00–0.07)
Basophils Absolute: 0.1 10*3/uL (ref 0.0–0.1)
Basophils Relative: 1 %
Eosinophils Absolute: 0.1 10*3/uL (ref 0.0–0.5)
Eosinophils Relative: 1 %
HCT: 33.4 % — ABNORMAL LOW (ref 39.0–52.0)
Hemoglobin: 11.1 g/dL — ABNORMAL LOW (ref 13.0–17.0)
Immature Granulocytes: 3 %
Lymphocytes Relative: 6 %
Lymphs Abs: 0.9 10*3/uL (ref 0.7–4.0)
MCH: 27.9 pg (ref 26.0–34.0)
MCHC: 33.2 g/dL (ref 30.0–36.0)
MCV: 83.9 fL (ref 80.0–100.0)
Monocytes Absolute: 0.9 10*3/uL (ref 0.1–1.0)
Monocytes Relative: 7 %
Neutro Abs: 11.4 10*3/uL — ABNORMAL HIGH (ref 1.7–7.7)
Neutrophils Relative %: 82 %
Platelets: 197 10*3/uL (ref 150–400)
RBC: 3.98 MIL/uL — ABNORMAL LOW (ref 4.22–5.81)
RDW: 13.9 % (ref 11.5–15.5)
WBC: 13.8 10*3/uL — ABNORMAL HIGH (ref 4.0–10.5)
nRBC: 0 % (ref 0.0–0.2)

## 2022-05-27 LAB — CK: Total CK: 107 U/L (ref 49–397)

## 2022-05-27 LAB — PROCALCITONIN: Procalcitonin: 1.17 ng/mL

## 2022-05-27 LAB — GLUCOSE, CAPILLARY
Glucose-Capillary: 167 mg/dL — ABNORMAL HIGH (ref 70–99)
Glucose-Capillary: 167 mg/dL — ABNORMAL HIGH (ref 70–99)
Glucose-Capillary: 156 mg/dL — ABNORMAL HIGH (ref 70–99)
Glucose-Capillary: 210 mg/dL — ABNORMAL HIGH (ref 70–99)

## 2022-05-27 MED ORDER — METOPROLOL SUCCINATE ER 25 MG PO TB24
125.0000 mg | ORAL_TABLET | Freq: Every day | ORAL | Status: DC
  Administered 2022-05-28: 125 mg via ORAL
  Filled 2022-05-27: qty 1

## 2022-05-27 MED ORDER — HYDROMORPHONE HCL 1 MG/ML IJ SOLN: 0.5000 mg | INTRAMUSCULAR | Status: AC | PRN

## 2022-05-27 MED ORDER — CEFAZOLIN SODIUM-DEXTROSE 2-4 GM/100ML-% IV SOLN
2.0000 g | Freq: Two times a day (BID) | INTRAVENOUS | Status: DC
  Administered 2022-05-27 – 2022-05-29 (×4): 2 g via INTRAVENOUS
  Filled 2022-05-27 (×4): qty 100

## 2022-05-27 NOTE — Plan of Care (Signed)

## 2022-05-27 NOTE — Progress Notes (Signed)
PT Cancellation Note  Patient Details Name: Pedro Mccoy MRN: [id number] DOB: [birth year]   Cancelled Treatment:    Reason Eval/Treat Not Completed: Other (comment): Nursing request PT hold this date secondary to pt lethargy and elevated resting HR.  Will attempt to see pt at a future date/time as medically appropriate.     Linus Salmons PT, DPT 05/27/22, 4:20 PM

## 2022-05-27 NOTE — Progress Notes (Signed)
Date of Admission:  05/27/2022      ID: Pedro Mccoy is a 71 y.o. male  Principal Problem:   Rhabdomyolysis Active Problems:   Schizophrenia ([place])   Fall at home, initial encounter   Pancreatic cancer ([place])   HTN (hypertension)   Abdominal pain   Abnormal LFTs   Depression with anxiety   HLD (hyperlipidemia)   Diabetes mellitus without complication ([place])   UTI (urinary tract infection)   Septic shock (HCC)   Acute pyelonephritis   Lumbar compression fracture, closed, initial encounter [facility])   Atrial flutter ([place])   Acute urinary retention   Lumbar discitis   Vertebral osteomyelitis ([place])   Psoas abscess, left ([place])   Spinal epidural abscess   AKI (acute kidney injury) ([place])   Acute metabolic encephalopathy  Ca pancreas ( a pancreatic mass incidentally noted on CT when he had a rt hip fracture in Nov 2022, a repeat CT in Aug 2023 showed increase in the size and he underwent 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   He presented to the ED on 05/20/2022 after a fall and initially refused to come to ED and later was brought in as he had fallen again and could not get off the floor 1 set blood culture NG UA 11-20 WBC CT abdomen Distended bladder Possible rt pyelo Has foley placed in the hospital UC MSSA MRI lumbar spine showed L2-L3 discitis and osteo, psoas abscess on the left and epidural abscess Pt underwent IR procedure on 05/23/22 - 10cc of pus aspirated from the psoas abscess land it is MSSA  Subjective: Apparently was lethargic this morning but awake and alert now Responds to questions   Medications:   apixaban  5 mg Oral BID   Chlorhexidine Gluconate Cloth  6 each Topical Daily   diltiazem  360 mg Oral Daily   FLUoxetine  40 mg Oral Daily   fluvoxaMINE  100 mg Oral BID    insulin aspart  0-5 Units Subcutaneous QHS   insulin aspart  0-9 Units Subcutaneous TID WC   lamoTRIgine  200 mg Oral BID   lidocaine  1 patch Transdermal Q24H   [START ON 05/23/2022] metoprolol succinate  125 mg Oral Daily   omega-3 acid ethyl esters  1 g Oral Daily   polyethylene glycol  17 g Oral Daily   risperiDONE  3 mg Oral QHS    Objective: Vital signs in last 24 hours: Temp:  [97.8 F (36.6 C)-98.7 F (37.1 C)] 97.8 F (36.6 C) (11/28 1604) Pulse Rate:  [121-129] 124 (11/28 2105) Resp:  [17-24] 17 (11/28 2105) BP: (98-139)/(77-84) 104/78 (11/28 2105) SpO2:  [97 %-99 %] 98 % (11/28 2105)  LDA PORT- has not been accessed  PHYSICAL EXAM:  General: Awake, weak, responds appropriately to questions , some confusion intermittently Patient Vitals for the past 24 hrs:  BP Temp Temp src Pulse Resp SpO2  05/27/22 2105 104/78 -- -- (!) 124 17 98 %  05/27/22 1604 98/79 97.8 F (36.6 C) Oral (!) 123 (!) 24 99 %  05/27/22 1133 111/84 98.1 F (36.7 C) Oral (!) 126 18 99 %  05/27/22 0738 110/77 98.3 F (36.8 C) Oral (!) 125 20 97 %  05/27/22 0355 102/84 98.7 F (37.1 C) Oral (!) 129 18 99 %  05/27/22 0045 139/83 98.4 F (36.9 C) Oral Marland Kitchen)  121 19 98 %     Left scalp carpet burn like injury .  Sacrum deep tissue injury  Back: sacrum- DTI Lungs:b/l air entry Heart: irregular Abdomen: Soft, non-tender,not distended. Bowel sounds normal. No masses Extremities: atraumatic, no cyanosis. No edema. No clubbing Skin: as above Lymph: Cervical, supraclavicular normal. 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 MRI shows L2-L3  discitis and osteomyelitis of L2 and most of L3 Left psoas abscess Epidural abscess  L2-L4 Pathological Compression fracture  L3 IR aspirated the psoas abscess and it is MSSA On cefazolin Continue cefazolin- will need for 6-8 weeks and PO after that Recommend TEE to look for endocarditis especially with PORT in place   repeat blood cultures- neg  AKI possibly due to diuresis Check CK as he was admitted with traumatic rhabdo  Confusion/lethargy due to morphine and oxy with underlying worsening of renal function- need to stop or adjust pain meds  Afib  Bipolar disorder/schizophrenia  H/o rt hip fracture s/p THA Nov 2022 Discussed the management with care team

## 2022-05-27 NOTE — Progress Notes (Signed)
Progress Note    Pedro Mccoy  TSV:[id number] DOB: October 15, 1950  DOA: 05/14/2022 PCP: Romualdo Bolk, FNP      Brief Narrative:    Medical records reviewed and are as summarized below:  Pedro Mccoy is a 71 y.o. male with past medical history of metastatic pancreatic cancer on chemotherapy, hypertension, diabetes mellitus, schizophrenia and tardive dyskinesia presented to the emergency room on 11/17 with complaints of weakness, fall x2 earlier that day and abdominal pain.  Work-up with septic shock secondary to pyelonephritis, acute versus subacute fracture of L3 vertebral body and rhabdomyolysis with CPK of 22,000.  Patient admitted to the hospitalist service and started on IV fluids and antibiotics.  By 11/19, urine cultures positive for MSSA.   Following admission, on the evening of 11/17, patient went to rapid atrial flutter.  Attempted treatment with metoprolol and IV fluids.  11/19, tachycardia persisting and patient started on Cardizem drip.  He was weaned off Cardizem drip on 05/20/2022.  He is on metoprolol and oral Cardizem for rate control.  11/23: White count continues to rise. No fevers. Unclear etiology 11/24: L2L3 discitis noted, epidural abscess. Large psoas abscess.  Sent to IR this morning, psoas abscess drained 11/25: White count downtrending       Assessment/Plan:   Principal Problem:   Rhabdomyolysis Active Problems:   Septic shock (HCC)   UTI (urinary tract infection)   Acute pyelonephritis   Atrial flutter (HCC)   Acute urinary retention   Pancreatic cancer ([place])   HLD (hyperlipidemia)   Lumbar compression fracture, closed, initial encounter ([place])   HTN (hypertension)   Schizophrenia ([place])   Depression with anxiety   Diabetes mellitus without complication ([place])   Abdominal pain   Abnormal LFTs   Fall at home, initial encounter   Lumbar discitis   Vertebral osteomyelitis ([place])   Psoas abscess, left ([place])   Spinal epidural abscess   AKI  (acute kidney injury) ([place])   Acute metabolic encephalopathy    Body mass index is 23.85 kg/m.    Septic shock ([place]) L2-L3 osteomyelitis with epidural abscess Left psoas abscess S/p CT-guided drainage of left psoas abscess on 05/23/2022 Continue IV cefazolin. No growth on blood cultures. Urine culture with Staph aureus.   MRI spine with osteo/abscess Fluid culture from psoas abscess showed Staph aureus. DO NOT access port Plan for TEE but this has been put on hold because of patient's current condition   AKI Creatinine has gotten worse, went from 2.31-3.16.  Renal ultrasound ordered today did not show any evidence of obstructive uropathy.  Continue IV fluids and monitor BMP.  Consulted Dr. Candiss Norse, nephrologist, to assist with management.   Acute metabolic encephalopathy CT head didn't show any acute abnormality. No acidosis or hypercapnia on ABG.  Procalcitonin and lactic acid were unremarkable.  No growth thus far on repeat blood cultures from 05/26/2022.  UA is still abnormal but he has a Foley catheter in place.  Chest x-ray showed left lower lung opacities which may represent atelectasis, pneumonia or perhaps a small left pleural effusion.  He is already on IV antibiotics   Rhabdomyolysis Improved   Acute pyelonephritis Urine cultures positive for MSSA.  He is on IV Ancef.  Atrial flutter ([place]), sinus tachycardia Weaned off IV Cardizem drip.  Continue oral metoprolol, Cardizem and Eliquis.  Increase metoprolol from 100 to 125 mg daily  Acute urinary retention Foley catheter placed on 05/18/2022   Lumbar compression fracture, closed, initial encounter [facility]) Unclear whether  this is compression fracture or pathologic in the setting of discitis.  Unfortunately patient is not a surgical candidate per Dr. Izora Ribas, neurosurgeon.  LSO brace ordered for patient comfort.   Pancreatic cancer ([place]) Metastatic mets to the liver.  Oncology following with recent chemotherapy.   Sees onc at [facility].  Next chemo scheduled for 11/30. Poor functional status. Outpatient follow-up with oncologist and palliative care    Other comorbidities include schizophrenia, hypertension, type II DM  Plan discussed with [name] (sister) and Curt Bears, NP, from palliative care team at the bedside.  [name] wants to continue with full scope of care at this time.    Diet Order             Diet Carb Modified Fluid consistency: Thin; Room service appropriate? Yes  Diet effective now                            Consultants: Neurosurgeon Interventional radiologist ID specialist Cardiologist Nephrologist  Procedures: CT-guided aspiration of left psoas abscess on 05/23/2022    Medications:    apixaban  5 mg Oral BID   Chlorhexidine Gluconate Cloth  6 each Topical Daily   diltiazem  360 mg Oral Daily   FLUoxetine  40 mg Oral Daily   fluvoxaMINE  100 mg Oral BID   insulin aspart  0-5 Units Subcutaneous QHS   insulin aspart  0-9 Units Subcutaneous TID WC   lamoTRIgine  200 mg Oral BID   lidocaine  1 patch Transdermal Q24H   [START ON 05/27/2022] metoprolol succinate  125 mg Oral Daily   omega-3 acid ethyl esters  1 g Oral Daily   polyethylene glycol  17 g Oral Daily   risperiDONE  3 mg Oral QHS   Continuous Infusions:   ceFAZolin (ANCEF) IV     lactated ringers 100 mL/hr at 05/27/22 0410     Anti-infectives (From admission, onward)    Start     Dose/Rate Route Frequency Ordered Stop   05/27/22 2200  ceFAZolin (ANCEF) IVPB 2g/100 mL premix        2 g 200 mL/hr over 30 Minutes Intravenous Every 12 hours 05/27/22 1503     05/20/22 2200  ceFAZolin (ANCEF) IVPB 2g/100 mL premix  Status:  Discontinued        2 g 200 mL/hr over 30 Minutes Intravenous Every 8 hours 05/20/22 1457 05/27/22 1503   05/18/22 2000  ceFAZolin (ANCEF) IVPB 1 g/50 mL premix  Status:  Discontinued        1 g 100 mL/hr over 30 Minutes Intravenous Every 8 hours 05/18/22 1852 05/20/22 1457    05/27/2022 2000  cefTRIAXone (ROCEPHIN) 1 g in sodium chloride 0.9 % 100 mL IVPB  Status:  Discontinued        1 g 200 mL/hr over 30 Minutes Intravenous Every 24 hours 05/04/2022 1706 05/18/22 1852   05/02/2022 1145  ceFEPIme (MAXIPIME) 2 g in sodium chloride 0.9 % 100 mL IVPB        2 g 200 mL/hr over 30 Minutes Intravenous  Once 05/08/2022 1136 05/11/2022 1246              Family Communication/Anticipated D/C date and plan/Code Status   DVT prophylaxis: Place and maintain sequential compression device Start: 05/22/22 1723 apixaban (ELIQUIS) tablet 5 mg     Code Status: Full Code  Family Communication: Plan discussed with his sister, [name], at the bedside Disposition Plan: Plan to discharge  to SNF   Status is: Inpatient Remains inpatient appropriate because: IV antibiotics       Subjective:   Interval events noted.  He is unable to provide much history because of lethargy.  [name], sister, was at the bedside.  Curt Bears, NP, with palliative care team was also at the bedside.  Objective:    Vitals:   05/27/22 0355 05/27/22 0738 05/27/22 1133 05/27/22 1604  BP: 102/84 110/77 111/84 98/79  Pulse: (!) 129 (!) 125 (!) 126 (!) 123  Resp: '18 20 18 '$ (!) 24  Temp: 98.7 F (37.1 C) 98.3 F (36.8 C) 98.1 F (36.7 C) 97.8 F (36.6 C)  TempSrc: Oral Oral Oral Oral  SpO2: 99% 97% 99% 99%  Weight:      Height:       No data found.   Intake/Output Summary (Last 24 hours) at 05/27/2022 1625 Last data filed at 05/27/2022 0410 Gross per 24 hour  Intake 2535.93 ml  Output --  Net 2535.93 ml   Filed Weights   05/23/2022 1042 05/23/22 0922  Weight: 82.1 kg 82 kg    Exam:  GEN: NAD SKIN: Warm and dry EYES: No pallor icterus ENT: MMM CV: RRR PULM: CTA B ABD: soft, ND, NT, +BS CNS: Lethargic but relatively more alert as compared to yesterday, non focal EXT: No edema or tenderness GU Foley catheter draining dark urine, mild scrotal and penile swelling     Pressure  Injury 05/21/22 Sacrum Upper;Medial;Bilateral Deep Tissue Pressure Injury - Purple or maroon localized area of discolored intact skin or blood-filled blister due to damage of underlying soft tissue from pressure and/or shear. (Active)  05/21/22 5732  Location: Sacrum  Location Orientation: Upper;Medial;Bilateral  Staging: Deep Tissue Pressure Injury - Purple or maroon localized area of discolored intact skin or blood-filled blister due to damage of underlying soft tissue from pressure and/or shear.  Wound Description (Comments):   Present on Admission: -- (unknown)  Dressing Type Foam - Lift dressing to assess site every shift 05/26/22 2232     Data Reviewed:   I have personally reviewed following labs and imaging studies:  Labs: Labs show the following:   Basic Metabolic Panel: Recent Labs  Lab 05/22/22 0511 05/23/22 0419 05/24/22 0411 05/25/22 1648 05/26/22 0405 05/27/22 0429  NA 140 141 135  --  137 135  K 2.9* 2.9* 3.2*   < > 3.6 4.0  CL 109 107 102  --  103 104  CO2 '22 24 24  '$ --  22 20*  GLUCOSE 172* 161* 167*  --  173* 146*  BUN 25* 28* 34*  --  62* 74*  CREATININE 0.94 1.08 1.15  --  2.31* 3.16*  CALCIUM 8.0* 7.9* 7.5*  --  7.9* 7.1*  MG  --   --   --   --  2.5*  --    < > = values in this interval not displayed.   GFR Estimated Creatinine Clearance: 24.2 mL/min (A) (by C-G formula based on SCr of 3.16 mg/dL (H)). Liver Function Tests: No results for input(s): "AST", "ALT", "ALKPHOS", "BILITOT", "PROT", "ALBUMIN" in the last 168 hours.  No results for input(s): "LIPASE", "AMYLASE" in the last 168 hours. No results for input(s): "AMMONIA" in the last 168 hours. Coagulation profile No results for input(s): "INR", "PROTIME" in the last 168 hours.  CBC: Recent Labs  Lab 05/22/22 0511 05/23/22 0419 05/24/22 0411 05/26/22 0405 05/27/22 0429  WBC 19.3* 20.2* 18.4* 14.4* 13.8*  NEUTROABS  --   --  14.6* 11.9* 11.4*  HGB 12.6* 12.6* 11.8* 12.2* 11.1*  HCT  36.7* 37.2* 34.6* 36.6* 33.4*  MCV 82.1 83.4 84.0 83.2 83.9  PLT 230 244 224 220 197   Cardiac Enzymes: No results for input(s): "CKTOTAL", "CKMB", "CKMBINDEX", "TROPONINI" in the last 168 hours.  BNP (last 3 results) No results for input(s): "PROBNP" in the last 8760 hours. CBG: Recent Labs  Lab 05/26/22 1547 05/26/22 2241 05/27/22 0740 05/27/22 1135 05/27/22 1606  GLUCAP 175* 148* 167* 167* 156*   D-Dimer: No results for input(s): "DDIMER" in the last 72 hours. Hgb A1c: No results for input(s): "HGBA1C" in the last 72 hours. Lipid Profile: No results for input(s): "CHOL", "HDL", "LDLCALC", "TRIG", "CHOLHDL", "LDLDIRECT" in the last 72 hours. Thyroid function studies: No results for input(s): "TSH", "T4TOTAL", "T3FREE", "THYROIDAB" in the last 72 hours.  Invalid input(s): "FREET3" Anemia work up: No results for input(s): "VITAMINB12", "FOLATE", "FERRITIN", "TIBC", "IRON", "RETICCTPCT" in the last 72 hours. Sepsis Labs: Recent Labs  Lab 05/21/22 0340 05/22/22 0511 05/23/22 0419 05/24/22 0411 05/26/22 0405 05/26/22 0905 05/26/22 1138 05/27/22 0429  PROCALCITON 2.19  --   --   --   --  0.98  --  1.17  WBC 16.7*   < > 20.2* 18.4* 14.4*  --   --  13.8*  LATICACIDVEN  --   --   --   --   --  1.2 1.3  --    < > = values in this interval not displayed.    Microbiology Recent Results (from the past 240 hour(s))  Culture, blood (Routine X 2) w Reflex to ID Panel     Status: None   Collection Time: 05/22/22  6:10 PM   Specimen: BLOOD  Result Value Ref Range Status   Specimen Description BLOOD RIGHT ANTECUBITAL  Final   Special Requests   Final    BOTTLES DRAWN AEROBIC AND ANAEROBIC Blood Culture adequate volume   Culture   Final    NO GROWTH 5 DAYS Performed at [facility], [address], [place], [place] [address]    Report Status 05/27/2022 FINAL  Final  Culture, blood (Routine X 2) w Reflex to ID Panel     Status: None   Collection Time: 05/22/22   6:10 PM   Specimen: BLOOD  Result Value Ref Range Status   Specimen Description BLOOD BLOOD RIGHT HAND  Final   Special Requests   Final    BOTTLES DRAWN AEROBIC AND ANAEROBIC Blood Culture adequate volume   Culture   Final    NO GROWTH 5 DAYS Performed at [facility], [address] [place], [place] [address]    Report Status 05/27/2022 FINAL  Final  Aerobic/Anaerobic Culture w Gram Stain (surgical/deep wound)     Status: None (Preliminary result)   Collection Time: 05/23/22 10:09 AM   Specimen: Abscess  Result Value Ref Range Status   Specimen Description   Final    ABSCESS Performed at [facility], [address], [place], [place] [address]    Special Requests   Final    NONE Performed at [facility], [address] [place], [place] [address]    Gram Stain   Final    ABUNDANT WBC PRESENT, PREDOMINANTLY PMN FEW GRAM POSITIVE COCCI IN PAIRS Performed at [facility], [place] [address], [place], [place] [address]    Culture   Final    FEW STAPHYLOCOCCUS AUREUS NO ANAEROBES ISOLATED; CULTURE IN PROGRESS FOR  5 DAYS    Report Status PENDING  Incomplete   Organism ID, Bacteria STAPHYLOCOCCUS AUREUS  Final      Susceptibility   Staphylococcus aureus - MIC*    CIPROFLOXACIN <=0.5 SENSITIVE Sensitive     ERYTHROMYCIN <=0.25 SENSITIVE Sensitive     GENTAMICIN <=0.5 SENSITIVE Sensitive     OXACILLIN <=0.25 SENSITIVE Sensitive     TETRACYCLINE <=1 SENSITIVE Sensitive     VANCOMYCIN <=0.5 SENSITIVE Sensitive     TRIMETH/SULFA <=10 SENSITIVE Sensitive     CLINDAMYCIN <=0.25 SENSITIVE Sensitive     RIFAMPIN <=0.5 SENSITIVE Sensitive     Inducible Clindamycin NEGATIVE Sensitive     * FEW STAPHYLOCOCCUS AUREUS  Culture, blood (Routine X 2) w Reflex to ID Panel     Status: None (Preliminary result)   Collection Time: 05/26/22  9:05 AM   Specimen: BLOOD RIGHT HAND  Result Value Ref Range Status   Specimen Description BLOOD RIGHT HAND  Final    Special Requests   Final    BOTTLES DRAWN AEROBIC AND ANAEROBIC Blood Culture adequate volume   Culture   Final    NO GROWTH < 24 HOURS Performed at [facility], [address], [place], [place] [address]    Report Status PENDING  Incomplete  Culture, blood (Routine X 2) w Reflex to ID Panel     Status: None (Preliminary result)   Collection Time: 05/26/22  9:12 AM   Specimen: BLOOD RIGHT FOREARM  Result Value Ref Range Status   Specimen Description BLOOD RIGHT FOREARM  Final   Special Requests   Final    BOTTLES DRAWN AEROBIC AND ANAEROBIC Blood Culture adequate volume   Culture   Final    NO GROWTH < 24 HOURS Performed at [facility], [address] [place], [place] [address]    Report Status PENDING  Incomplete    Procedures and diagnostic studies:  US RENAL  Result Date: 06-03-2022 CLINICAL DATA:  Acute kidney injury EXAM: RENAL / URINARY TRACT ULTRASOUND COMPLETE COMPARISON:  05/29/2022 FINDINGS: Right Kidney: Renal measurements: 12.6 x 5.4 x 6.2 cm = volume: 220 mL. Echogenicity within normal limits. Small right renal cysts which do not require follow-up imaging. No mass or hydronephrosis visualized. Left Kidney: Renal measurements: 13.2 x 5.9 x 6.4 cm = volume: 259 mL. Echogenicity within normal limits. No mass or hydronephrosis visualized. Bladder: Decompressed by Foley catheter. Other: None. IMPRESSION: No evidence of obstructive uropathy. Electronically Signed   By: Davina Poke D.O.   On: 2022-06-03 09:50   CT HEAD WO CONTRAST (5MM)  Result Date: 05/26/2022 CLINICAL DATA:  Mental status change, unknown cause. EXAM: CT HEAD WITHOUT CONTRAST TECHNIQUE: Contiguous axial images were obtained from the base of the skull through the vertex without intravenous contrast. RADIATION DOSE REDUCTION: This exam was performed according to the departmental dose-optimization program which includes automated exposure control, adjustment of the mA and/or kV according  to patient size and/or use of iterative reconstruction technique. COMPARISON:  05/29/2022 FINDINGS: Brain: Age related generalized atrophy. No focal abnormality seen affecting the brainstem or cerebellum. There are probably mild chronic small-vessel ischemic changes of the cerebral hemispheric white matter. No evidence of recent focal infarction, mass lesion, hemorrhage, hydrocephalus or extra-axial collection. Chronic ventriculomegaly appears stable, likely due to central volume loss. Vascular: No abnormal vascular finding. Skull: Negative Sinuses/Orbits: Clear/normal Other: None IMPRESSION: No acute CT finding. Generalized atrophy. Mild chronic small-vessel ischemic changes of the cerebral hemispheric white matter. Chronic ventriculomegaly, likely due  to central volume loss. Stable examination since 10 days ago. Electronically Signed   By: Nelson Chimes M.D.   On: 05/26/2022 10:25   DG Chest Port 1 View  Result Date: 05/26/2022 CLINICAL DATA:  Altered mental status. EXAM: PORTABLE CHEST 1 VIEW COMPARISON:  AP chest 05/15/2022 and w11/21/2022 FINDINGS: Right chest wall porta catheter tip again overlies the superior vena cava/right atrial junction. Cardiac silhouette and mediastinal contours are within normal limits. Mild-to-moderate calcification within the aortic arch. Left lower lung heterogeneous and linear opacities are new from 05/07/2022. No right pleural effusion. No pneumothorax is seen. Mild dextrocurvature of the mid to upper thoracic spine with mild multilevel degenerative disc changes. IMPRESSION: Left lower lung heterogeneous and linear opacities are new from 05/29/2022 and may represent atelectasis or pneumonia. It is difficult to exclude a small left pleural effusion. Electronically Signed   By: Yvonne Kendall M.D.   On: 05/26/2022 09:02               LOS: 11 days   Jonella Redditt  [facility]   Pager on [email or web address]. If 7PM-7AM, please contact night-coverage at  [email or web address]     05/27/2022, 4:25 PM

## 2022-05-27 NOTE — Progress Notes (Signed)
OT Cancellation Note  Patient Details Name: Pedro Mccoy MRN: [id number] DOB: 02-12-1951   Cancelled Treatment:    Reason Eval/Treat Not Completed: Medical issues which prohibited therapy;Other (comment). Chart reviewed. Pt's HR noted to be elevated to 120s at rest on telemetry. Spoke with RN, pt very lethargic today. Will hold OT at this time 2/2 pt's elevated HR at rest and decreased/impaired level of responsiveness. Will re-attempt as able.   Doneta Public 05/27/2022, 4:00 PM

## 2022-05-27 NOTE — Significant Event (Signed)
[facility] Cardiology is consulted re: eval for TEE to r/o endocarditis. Today the patient is rather lethargic, falling asleep mid interview, and is requiring supplemental oxygen. Discussed with his nurse and Dr. Nehemiah Massed who agrees that this patient is not appropriate for TEE tomorrow, 11/29. Will reevaluate on Monday regarding appropriateness for TEE.  Orinda Kenner, PA-C  1:34pm 05/27/22

## 2022-05-27 NOTE — Progress Notes (Addendum)
Palliative Care Progress Note, Assessment & Plan   Patient Name: Pedro Mccoy       Date: 05/27/2022 DOB: Jun 06, 1951  Age: 71 y.o. MRN#: [id number] Attending Physician: Jennye Boroughs, MD Primary Care Physician: Romualdo Bolk, FNP Admit Date: 05/09/2022  Reason for Consultation/Follow-up: Establishing goals of care  Subjective: Patient is lying in bed in no apparent distress.  He appears fatigued but is easily arousable.  He knowledges my presence and is able to make his wishes known.  His Sister [name] is at bedside.  HPI: 71 y.o. male  with past medical history of prostate cancer with known metastasis to liver, HTN, type 2 diabetes, schizophrenia (visual hallucinations), tardive dyskinesia, and right total hip replacement admitted on 05/05/2022 with weakness and falls (X2) at home.   Patient is being treated for septic shock secondary to pyelonephritis as well as L3 vertebral fracture, rhabdomyolysis, and urine cultures positive for MSSA.   On 11/19, patient became tachycardic, failed to respond to metoprolol and IVF, and was placed on Cardizem gtt. He has since been converted to oral cardizem.    On 11/23, WBC levels continue to rise. MRI of spine revealed L2/L3 discitis/osteomyelitis with associated epidural large left psoas abscess. Pt was placed in NPO status, Eliquis held, and IR consulted for consideration of percutaneous drain.    On 11/24, pt had CT with aspiration of left psoas abscess without complication. Pt receiving IV abx.   Given trending up of creatinine and AKI, nephrology has been consulted.  Given rapid HR in AM of 11/27, cardiology has been consulted.   TEE to be completed.    PMT was consulted to discuss goals of care.  Summary of counseling/coordination of care: After  reviewing the patient's chart and assessing the patient at bedside, I spoke with patient) in regards to goals of care. Dr. Mal Misty joined our discussion and outlined patient's current treatment plan.  Education provided on IV antibiotics, abscess, pancreatic cancer, functional and nutritional status. We discussed human mortality, suffering, pain, and quality vs quantity of life.   I attempted to elicit goals and values important to the patient. He shares he will do what he needs to do. He speaks few words during our discussion.   His sister [name] remains hopeful that patient will be able to have TEE and [place] of kidneys and then transfer to nursing facility. I attempted to discuss patient's overall prognosis as poor. However, [name] maintains that she wants the patient to get better, do what he needs to do to "get out of the hospital", and transfer to a nursing facility.   [name] is very concerned about being able to complete a living will. I advised her that a healthcare POA can be completed while hospitalized but that the hospital does not provide services for financial POA.   Discussed importance of making patient's wishes known while Brenda/surrogate decision maker is present and able to have discussions with patient. DNR discussed. MOST form introduced and discussed. Patient verbalized he would never want to live on a machine or want a feeding tube. However, he was not clear on whether or not he would want to receive IVF in the event that he is unable to  eat/drink on his own. I encouraged [name] and patient to discuss his wishes. Ongoing support and discussion of goals and boundaries on care to continue. Full code/full scope remains.   Treat the treatable. Watchful waiting. TEE and nephrology recs pending. PMT will continue to follow.   Physical Exam Vitals reviewed.  HENT:     Mouth/Throat:     Mouth: Mucous membranes are moist.  Eyes:     Pupils: Pupils are equal, round, and reactive to light.   Cardiovascular:     Rate and Rhythm: Normal rate.     Pulses: Normal pulses.  Pulmonary:     Effort: Pulmonary effort is normal.  Abdominal:     Palpations: Abdomen is soft.  Musculoskeletal:     Comments: Generalized weakness  Skin:    General: Skin is warm and dry.  Neurological:     Mental Status: He is alert and oriented to person, place, and time.  Psychiatric:        Mood and Affect: Mood normal.        Behavior: Behavior normal.        Thought Content: Thought content normal.        Judgment: Judgment normal.             Palliative Assessment/Data: 30-40%    Total Time 35 minutes  Greater than 50%  of this time was spent counseling and coordinating care related to the above assessment and plan.  Thank you for allowing the Palliative Medicine Team to assist in the care of this patient.  [place]ad[place] Ilsa Iha, FNP-BC Palliative Medicine Team Team Phone # [phone]

## 2022-05-27 NOTE — Consult Note (Addendum)
[facility]  CONSULT NOTE    Date: 05/27/2022                  Patient Name:  Pedro Mccoy  MRN: [id number]  DOB: Jun 28, 1951  Age / Sex: 71 y.o., male         PCP: Romualdo Bolk, FNP                 Service Requesting Consult: [place]                 Reason for Consult: Acute kidney injury            History of Present Illness: Pedro Mccoy is a 71 y.o.  male with past medical conditions including hyperlipidemia, hypertension, diabetes, depression and anxiety, schizophrenia, tardive dyskinesia, and metastatic pancreatic cancer on chemo, who was admitted to [facility] on 05/07/2022 for Rhabdomyolysis [M62.82] Traumatic rhabdomyolysis, initial encounter ([place]) [T79.6XXA]  Patient presents to the emergency department after a fall complaining of weakness and abdominal pain.  Patient states he currently lives alone, spouse deceased no children.  States he has an aunt that checks on him frequently.  Patient reports frequent falls, 2 falls the a.m. prior to presenting to the emergency department.  Poor appetite is blamed on recent chemotherapy.  Reports mild nausea, denies vomiting or diarrhea.  Reports intermittent NSAID use.  No history of kidney disease.  He reports having abdominal pain for 1 to 2 days.  Creatinine within normal range on admission however steadily increased since 05/26/2022.  Renal ultrasound negative for obstruction.  Patient was given IV diuretics however held due to decreased renal function.  Medications: Outpatient medications: Medications Prior to Admission  Medication Sig Dispense Refill Last Dose   BELSOMRA 20 MG TABS Take 20 mg by mouth at bedtime.   05/15/2022   FLUoxetine (PROZAC) 40 MG capsule Take 40 mg by mouth daily.   05/15/2022   fluvoxaMINE (LUVOX) 100 MG tablet Take 100 mg by mouth 2 (two) times a day.   05/15/2022   lamoTRIgine (LAMICTAL) 200 MG tablet Take 200 mg by mouth 2 (two) times a day.   05/15/2022   LORazepam (ATIVAN) 1  MG tablet Take 1-1.5 mg by mouth daily as needed for anxiety.   05/15/2022   metFORMIN (GLUCOPHAGE) 1000 MG tablet Take 1,000 mg by mouth 2 (two) times a day.   05/15/2022   metoprolol succinate (TOPROL-XL) 50 MG 24 hr tablet Take 50 mg by mouth daily. Take with or immediately following a meal.   05/15/2022   NIFEdipine (PROCARDIA XL/NIFEDICAL-XL) 90 MG 24 hr tablet Take 90 mg by mouth daily.   05/15/2022   omega-3 acid ethyl esters (LOVAZA) 1 g capsule Take 1 g by mouth daily.   05/15/2022   risperiDONE (RISPERDAL) 3 MG tablet Take 3 mg by mouth at bedtime.   05/15/2022   rosuvastatin (CRESTOR) 20 MG tablet Take 20 mg by mouth daily.   05/15/2022   apixaban (ELIQUIS) 2.5 MG TABS tablet Take 1 tablet (2.5 mg total) by mouth 2 (two) times daily for 13 days. (Patient not taking: Reported on 05/10/2022) 26 tablet 0 Not Taking   baclofen (LIORESAL) 10 MG tablet Take 1 tablet (10 mg total) by mouth 2 (two) times daily. (Patient not taking: Reported on 05/06/2022) 20 tablet 0 Not Taking   benztropine (COGENTIN) 0.5 MG tablet Take 0.5 mg by mouth 2 (two) times a day. (Patient not taking: Reported on 05/15/2022)   Not  Taking   docusate sodium (COLACE) 100 MG capsule Take 1 capsule (100 mg total) by mouth 2 (two) times daily. (Patient not taking: Reported on 05/11/2022) 10 capsule 0 Not Taking   Eszopiclone 3 MG TABS Take 3 mg by mouth at bedtime. (Patient not taking: Reported on 05/21/2022)   Not Taking   haloperidol (HALDOL) 5 MG tablet Take 2.5 mg by mouth daily. (Patient not taking: Reported on 05/25/2022)   Not Taking   metoCLOPramide (REGLAN) 10 MG tablet Take 1 tablet (10 mg total) by mouth 3 (three) times daily before meals. (Patient not taking: Reported on 05/03/2022) 30 tablet 0 Not Taking   oxyCODONE-acetaminophen (PERCOCET/ROXICET) 5-325 MG tablet Take 1 tablet by mouth every 6 (six) hours as needed for moderate pain. (Patient not taking: Reported on 05/23/2022) 30 tablet 0 Not Taking    Current  medications: Current Facility-Administered Medications  Medication Dose Route Frequency Provider Last Rate Last Admin   apixaban (ELIQUIS) tablet 5 mg  5 mg Oral BID Dallie Piles, RPH   5 mg at 05/27/22 1100   ceFAZolin (ANCEF) IVPB 2g/100 mL premix  2 g Intravenous Q12H Delena Bali, RPH       Chlorhexidine Gluconate Cloth 2 % PADS 6 each  6 each Topical Daily Annita Brod, MD   6 each at 05/27/22 1130   diltiazem (CARDIZEM CD) 24 hr capsule 360 mg  360 mg Oral Daily Annita Brod, MD   360 mg at 05/27/22 1100   FLUoxetine (PROZAC) capsule 40 mg  40 mg Oral Daily Annita Brod, MD   40 mg at 05/27/22 1100   fluvoxaMINE (LUVOX) tablet 100 mg  100 mg Oral BID Annita Brod, MD   100 mg at 05/27/22 1100   hydrALAZINE (APRESOLINE) injection 5 mg  5 mg Intravenous Q2H PRN Annita Brod, MD       HYDROmorphone (DILAUDID) injection 0.5 mg  0.5 mg Intravenous Q4H PRN Jennye Boroughs, MD       insulin aspart (novoLOG) injection 0-5 Units  0-5 Units Subcutaneous QHS Annita Brod, MD   2 Units at 05/21/22 2225   insulin aspart (novoLOG) injection 0-9 Units  0-9 Units Subcutaneous TID WC Annita Brod, MD   2 Units at 05/27/22 1400   lactated ringers infusion   Intravenous Continuous Jennye Boroughs, MD 100 mL/hr at 05/27/22 0410 Infusion Verify at 05/27/22 0410   lamoTRIgine (LAMICTAL) tablet 200 mg  200 mg Oral BID Annita Brod, MD   200 mg at 05/27/22 1100   lidocaine (LIDODERM) 5 % 1 patch  1 patch Transdermal Q24H Annita Brod, MD   1 patch at 05/26/22 1930   LORazepam (ATIVAN) tablet 1 mg  1 mg Oral Daily PRN Annita Brod, MD       [START ON 05/18/2022] metoprolol succinate (TOPROL-XL) 24 hr tablet 125 mg  125 mg Oral Daily Beers, Brandon D, RPH       metoprolol tartrate (LOPRESSOR) injection 5 mg  5 mg Intravenous Q2H PRN Annita Brod, MD   5 mg at 05/27/22 0504   omega-3 acid ethyl esters (LOVAZA) capsule 1 g  1 g Oral Daily  Annita Brod, MD   1 g at 05/27/22 1100   ondansetron (ZOFRAN) injection 4 mg  4 mg Intravenous Q8H PRN Annita Brod, MD       Oral care mouth rinse  15 mL Mouth Rinse PRN Annita Brod, MD  oxyCODONE (Oxy IR/ROXICODONE) immediate release tablet 5 mg  5 mg Oral Q6H PRN Annita Brod, MD   5 mg at 05/25/22 1135   polyethylene glycol (MIRALAX / GLYCOLAX) packet 17 g  17 g Oral Daily Oswald Hillock, RPH   17 g at 05/27/22 1100   polyvinyl alcohol (LIQUIFILM TEARS) 1.4 % ophthalmic solution 2 drop  2 drop Both Eyes PRN Jennye Boroughs, MD   2 drop at 05/26/22 2144   risperiDONE (RISPERDAL) tablet 3 mg  3 mg Oral QHS Annita Brod, MD   3 mg at 05/26/22 2144      Allergies: Allergies  Allergen Reactions   Ace Inhibitors Hives and Rash   Hydrochlorothiazide Hives   Pioglitazone Nausea Only   Codeine     Has tolerated morphine in the past   Sitagliptin Nausea And Vomiting   Lithium Diarrhea      Past Medical History: Past Medical History:  Diagnosis Date   Hypertension    Hypertriglyceridemia .     Past Surgical History: Past Surgical History:  Procedure Laterality Date   NO PAST SURGERIES     TOTAL HIP ARTHROPLASTY Right 05/21/2021   Procedure: ANTERIOR HIP TOTAL ARTHROPLASTY;  Surgeon: Hessie Knows, MD;  Location: [facility] ORS;  Service: Orthopedics;  Laterality: Right;     Family History: Family History  Problem Relation Age of Onset   COPD Mother    Heart attack Father      Social History: Social History   Socioeconomic History   Marital status: Widowed    Spouse name: Not on file   Number of children: Not on file   Years of education: Not on file   Highest education level: Not on file  Occupational History   Not on file  Tobacco Use   Smoking status: Never   Smokeless tobacco: Never  Vaping Use   Vaping Use: Never used  Substance and Sexual Activity   Alcohol use: No   Drug use: No   Sexual activity: Not on file  Other  Topics Concern   Not on file  Social History Narrative   Not on file   Social Determinants of Health   Financial Resource Strain: Not on file  Food Insecurity: No Food Insecurity (05/22/2022)   Hunger Vital Sign    Worried About Running Out of Food in the Last Year: Never true    Ran Out of Food in the Last Year: Never true  Transportation Needs: No Transportation Needs (05/11/2022)   PRAPARE - Hydrologist (Medical): No    Lack of Transportation (Non-Medical): No  Physical Activity: Not on file  Stress: Not on file  Social Connections: Not on file  Intimate Partner Violence: Not At Risk (05/03/2022)   Humiliation, Afraid, Rape, and Kick questionnaire    Fear of Current or Ex-Partner: No    Emotionally Abused: No    Physically Abused: No    Sexually Abused: No     Review of Systems: Review of Systems  Constitutional:  Negative for chills, fever and malaise/fatigue.  HENT:  Negative for congestion, sore throat and tinnitus.   Eyes:  Negative for blurred vision and redness.  Respiratory:  Negative for cough, shortness of breath and wheezing.   Cardiovascular:  Negative for chest pain, palpitations, claudication and leg swelling.  Gastrointestinal:  Positive for abdominal pain. Negative for blood in stool, diarrhea, nausea and vomiting.  Genitourinary:  Negative for flank pain, frequency and hematuria.  Musculoskeletal:  Positive for falls. Negative for back pain and myalgias.  Skin:  Negative for rash.  Neurological:  Positive for weakness. Negative for dizziness and headaches.  Endo/Heme/Allergies:  Does not bruise/bleed easily.  Psychiatric/Behavioral:  Negative for depression. The patient is not nervous/anxious and does not have insomnia.     Vital Signs: Blood pressure 98/79, pulse (!) 123, temperature 97.8 F (36.6 C), temperature source Oral, resp. rate (!) 24, height '6\' 1"'$  (1.854 m), weight 82 kg, SpO2 99 %.  Weight trends: Filed  Weights   05/12/2022 1042 05/23/22 0922  Weight: 82.1 kg 82 kg    Physical Exam: General: NAD, ill-appearing  Head: Normocephalic, atraumatic.  Dry oral mucosal membranes  Eyes: Anicteric  Lungs:  Clear to auscultation, normal effort  Heart: Regular rate and rhythm  Abdomen:  Soft, nontender, nondistended  Extremities: No peripheral edema.  Neurologic: Nonfocal, moving all four extremities  Skin: No lesions  Access: None     Lab results: Basic Metabolic Panel: Recent Labs  Lab 05/24/22 0411 05/25/22 1648 05/26/22 0405 05/27/22 0429  NA 135  --  137 135  K 3.2* 3.7 3.6 4.0  CL 102  --  103 104  CO2 24  --  22 20*  GLUCOSE 167*  --  173* 146*  BUN 34*  --  62* 74*  CREATININE 1.15  --  2.31* 3.16*  CALCIUM 7.5*  --  7.9* 7.1*  MG  --   --  2.5*  --     Liver Function Tests: No results for input(s): "AST", "ALT", "ALKPHOS", "BILITOT", "PROT", "ALBUMIN" in the last 168 hours. No results for input(s): "LIPASE", "AMYLASE" in the last 168 hours. No results for input(s): "AMMONIA" in the last 168 hours.  CBC: Recent Labs  Lab 05/22/22 0511 05/23/22 0419 05/24/22 0411 05/26/22 0405 05/27/22 0429  WBC 19.3* 20.2* 18.4* 14.4* 13.8*  NEUTROABS  --   --  14.6* 11.9* 11.4*  HGB 12.6* 12.6* 11.8* 12.2* 11.1*  HCT 36.7* 37.2* 34.6* 36.6* 33.4*  MCV 82.1 83.4 84.0 83.2 83.9  PLT 230 244 224 220 197    Cardiac Enzymes: No results for input(s): "CKTOTAL", "CKMB", "CKMBINDEX", "TROPONINI" in the last 168 hours.  BNP: Invalid input(s): "POCBNP"  CBG: Recent Labs  Lab 05/26/22 1547 05/26/22 2241 05/27/22 0740 05/27/22 1135 05/27/22 1606  GLUCAP 175* 148* 167* 167* 156*    Microbiology: Results for orders placed or performed during the hospital encounter of 05/21/2022  Urine Culture     Status: Abnormal   Collection Time: 05/11/2022 12:57 PM   Specimen: Urine, Clean Catch  Result Value Ref Range Status   Specimen Description URINE, CLEAN CATCH  Final   Special  Requests NONE  Final   Culture >=100,000 COLONIES/mL STAPHYLOCOCCUS AUREUS (A)  Final   Report Status 05/18/2022 FINAL  Final   Organism ID, Bacteria STAPHYLOCOCCUS AUREUS (A)  Final      Susceptibility   Staphylococcus aureus - MIC*    CIPROFLOXACIN <=0.5 SENSITIVE Sensitive     GENTAMICIN <=0.5 SENSITIVE Sensitive     NITROFURANTOIN <=16 SENSITIVE Sensitive     OXACILLIN <=0.25 SENSITIVE Sensitive     TETRACYCLINE <=1 SENSITIVE Sensitive     VANCOMYCIN <=0.5 SENSITIVE Sensitive     TRIMETH/SULFA <=10 SENSITIVE Sensitive     CLINDAMYCIN <=0.25 SENSITIVE Sensitive     RIFAMPIN <=0.5 SENSITIVE Sensitive     Inducible Clindamycin NEGATIVE Sensitive     * >=100,000 COLONIES/mL STAPHYLOCOCCUS AUREUS  Culture, blood (  single)     Status: None   Collection Time: 05/20/2022  3:00 PM   Specimen: BLOOD  Result Value Ref Range Status   Specimen Description BLOOD BLOOD RIGHT ARM  Final   Special Requests   Final    BOTTLES DRAWN AEROBIC AND ANAEROBIC Blood Culture results may not be optimal due to an excessive volume of blood received in culture bottles   Culture   Final    NO GROWTH 5 DAYS Performed at [facility], [address], [place], [place] [address]    Report Status 05/21/2022 FINAL  Final  Culture, blood (Routine X 2) w Reflex to ID Panel     Status: None   Collection Time: 05/22/22  6:10 PM   Specimen: BLOOD  Result Value Ref Range Status   Specimen Description BLOOD RIGHT ANTECUBITAL  Final   Special Requests   Final    BOTTLES DRAWN AEROBIC AND ANAEROBIC Blood Culture adequate volume   Culture   Final    NO GROWTH 5 DAYS Performed at [facility], [address] [place], [place] [address]    Report Status 05/27/2022 FINAL  Final  Culture, blood (Routine X 2) w Reflex to ID Panel     Status: None   Collection Time: 05/22/22  6:10 PM   Specimen: BLOOD  Result Value Ref Range Status   Specimen Description BLOOD BLOOD RIGHT HAND  Final   Special Requests    Final    BOTTLES DRAWN AEROBIC AND ANAEROBIC Blood Culture adequate volume   Culture   Final    NO GROWTH 5 DAYS Performed at [facility], [address], [place], [place] [address]    Report Status 05/27/2022 FINAL  Final  Aerobic/Anaerobic Culture w Gram Stain (surgical/deep wound)     Status: None (Preliminary result)   Collection Time: 05/23/22 10:09 AM   Specimen: Abscess  Result Value Ref Range Status   Specimen Description   Final    ABSCESS Performed at [facility], [address], [place], [place] [address]    Special Requests   Final    NONE Performed at [facility], [address] [place], [place] [address]    Gram Stain   Final    ABUNDANT WBC PRESENT, PREDOMINANTLY PMN FEW GRAM POSITIVE COCCI IN PAIRS Performed at [facility], [place] [address] [place], [place] [address]    Culture   Final    FEW STAPHYLOCOCCUS AUREUS NO ANAEROBES ISOLATED; CULTURE IN PROGRESS FOR 5 DAYS    Report Status PENDING  Incomplete   Organism ID, Bacteria STAPHYLOCOCCUS AUREUS  Final      Susceptibility   Staphylococcus aureus - MIC*    CIPROFLOXACIN <=0.5 SENSITIVE Sensitive     ERYTHROMYCIN <=0.25 SENSITIVE Sensitive     GENTAMICIN <=0.5 SENSITIVE Sensitive     OXACILLIN <=0.25 SENSITIVE Sensitive     TETRACYCLINE <=1 SENSITIVE Sensitive     VANCOMYCIN <=0.5 SENSITIVE Sensitive     TRIMETH/SULFA <=10 SENSITIVE Sensitive     CLINDAMYCIN <=0.25 SENSITIVE Sensitive     RIFAMPIN <=0.5 SENSITIVE Sensitive     Inducible Clindamycin NEGATIVE Sensitive     * FEW STAPHYLOCOCCUS AUREUS  Culture, blood (Routine X 2) w Reflex to ID Panel     Status: None (Preliminary result)   Collection Time: 05/26/22  9:05 AM   Specimen: BLOOD RIGHT HAND  Result Value Ref Range Status   Specimen Description BLOOD RIGHT HAND  Final  Special Requests   Final    BOTTLES DRAWN AEROBIC AND ANAEROBIC Blood Culture adequate volume   Culture   Final    NO GROWTH < 24  HOURS Performed at [facility], [address] [place], [place] [address]    Report Status PENDING  Incomplete  Culture, blood (Routine X 2) w Reflex to ID Panel     Status: None (Preliminary result)   Collection Time: 05/26/22  9:12 AM   Specimen: BLOOD RIGHT FOREARM  Result Value Ref Range Status   Specimen Description BLOOD RIGHT FOREARM  Final   Special Requests   Final    BOTTLES DRAWN AEROBIC AND ANAEROBIC Blood Culture adequate volume   Culture   Final    NO GROWTH < 24 HOURS Performed at [facility], [address] [place], [place] [address]    Report Status PENDING  Incomplete    Coagulation Studies: No results for input(s): "LABPROT", "INR" in the last 72 hours.  Urinalysis: Recent Labs    05/26/22 1230  COLORURINE AMBER*  LABSPEC 1.017  PHURINE 5.0  GLUCOSEU NEGATIVE  HGBUR LARGE*  BILIRUBINUR NEGATIVE  KETONESUR NEGATIVE  PROTEINUR 100*  NITRITE NEGATIVE  LEUKOCYTESUR MODERATE*      Imaging: US RENAL  Result Date: 05/27/2022 CLINICAL DATA:  Acute kidney injury EXAM: RENAL / URINARY TRACT ULTRASOUND COMPLETE COMPARISON:  05/05/2022 FINDINGS: Right Kidney: Renal measurements: 12.6 x 5.4 x 6.2 cm = volume: 220 mL. Echogenicity within normal limits. Small right renal cysts which do not require follow-up imaging. No mass or hydronephrosis visualized. Left Kidney: Renal measurements: 13.2 x 5.9 x 6.4 cm = volume: 259 mL. Echogenicity within normal limits. No mass or hydronephrosis visualized. Bladder: Decompressed by Foley catheter. Other: None. IMPRESSION: No evidence of obstructive uropathy. Electronically Signed   By: Davina Poke D.O.   On: 05/27/2022 09:50   CT HEAD WO CONTRAST (5MM)  Result Date: 05/26/2022 CLINICAL DATA:  Mental status change, unknown cause. EXAM: CT HEAD WITHOUT CONTRAST TECHNIQUE: Contiguous axial images were obtained from the base of the skull through the vertex without intravenous contrast. RADIATION DOSE  REDUCTION: This exam was performed according to the departmental dose-optimization program which includes automated exposure control, adjustment of the mA and/or kV according to patient size and/or use of iterative reconstruction technique. COMPARISON:  05/04/2022 FINDINGS: Brain: Age related generalized atrophy. No focal abnormality seen affecting the brainstem or cerebellum. There are probably mild chronic small-vessel ischemic changes of the cerebral hemispheric white matter. No evidence of recent focal infarction, mass lesion, hemorrhage, hydrocephalus or extra-axial collection. Chronic ventriculomegaly appears stable, likely due to central volume loss. Vascular: No abnormal vascular finding. Skull: Negative Sinuses/Orbits: Clear/normal Other: None IMPRESSION: No acute CT finding. Generalized atrophy. Mild chronic small-vessel ischemic changes of the cerebral hemispheric white matter. Chronic ventriculomegaly, likely due to central volume loss. Stable examination since 10 days ago. Electronically Signed   By: Nelson Chimes M.D.   On: 05/26/2022 10:25   DG Chest Port 1 View  Result Date: 05/26/2022 CLINICAL DATA:  Altered mental status. EXAM: PORTABLE CHEST 1 VIEW COMPARISON:  AP chest 05/17/2022 and w11/21/2022 FINDINGS: Right chest wall porta catheter tip again overlies the superior vena cava/right atrial junction. Cardiac silhouette and mediastinal contours are within normal limits. Mild-to-moderate calcification within the aortic arch. Left lower lung heterogeneous and linear opacities are new from 05/29/2022. No right pleural effusion. No pneumothorax is seen. Mild dextrocurvature of the mid to upper thoracic spine with mild multilevel degenerative  disc changes. IMPRESSION: Left lower lung heterogeneous and linear opacities are new from 05/23/2022 and may represent atelectasis or pneumonia. It is difficult to exclude a small left pleural effusion. Electronically Signed   By: Yvonne Kendall M.D.   On:  05/26/2022 09:02     Assessment & Plan: Pedro Mccoy is a 71 y.o.  male with past medical conditions including hyperlipidemia, hypertension, diabetes, depression and anxiety, schizophrenia, tardive dyskinesia, and metastatic pancreatic cancer on chemo, who was admitted to [facility] on 05/22/2022 for Rhabdomyolysis [M62.82] Traumatic rhabdomyolysis, initial encounter ([place]) [T79.6XXA]  Acute kidney injury with hematuria/proteinuria appears to be multifactorial secondary to sepsis and hemodynamic instability resulting in ATN.  Renal ultrasound negative for obstruction.  History of hematuria however RBCs present with this admission.  Patient currently prescribed Eliquis.  Will defer autoimmune work-up at this time.  Patient also treated with IV diuretics, currently held due to increased creatinine.  Attempt to avoid nephrotoxic agents and therapies at this time.  No acute need for dialysis.  2.  Acute pyelonephritis with urinary retention.  Urine cultures positive for MSSA and patient currently treated with IV Ancef.  Foley catheter placed.  3.  Rhabdomyolysis, CK peak 22,000 and is now 847.  Foley catheter with tea colored urine  LOS: 11   11/28/20234:35 PM

## 2022-05-27 NOTE — TOC Progression Note (Addendum)
Transition of Care [facility]) - Progression Note    Patient Details  Name: Pedro Mccoy MRN: [id number] Date of Birth: Jul 28, 1950  Transition of Care [facility]) CM/SW [place], LCSW Phone Number: 05/27/2022, 9:16 AM  Clinical Narrative:  CSW called pt's sister, Pedro Mccoy [phone]) to discuss recs for SNF.  CSW explained the referral process to her and placed referrals.  CSW will give any bed offers to her once they are accepted.  CSW will f/u   Pedro Mccoy inquired about Healthcare Decision Maker forms.  CSW asked floor nurse if she could provide.  She will place a consult for Pedro Mccoy.  2:29  Called pts sister, Pedro Mccoy, to inform her that [facility] has offered a bed.  Pedro Mccoy accepted. Called Sharyn Lull in Admissions @ [facility] to inform her, as well.   Barriers to discharge:  Continued medical work up      Expected Discharge Plan and Services; SNF                                                 Social Determinants of Health (SDOH) Interventions Food Insecurity Interventions: Intervention Not Indicated  Readmission Risk Interventions    05/22/2021   10:48 AM  Readmission Risk Prevention Plan  Transportation Screening Complete  PCP or Specialist Appt within 3-5 Days Complete  HRI or [place] Complete  Social Work Consult for [place] Planning/Counseling Complete  Palliative Care Screening Not Applicable  Medication Review Press photographer) Complete

## 2022-05-27 NOTE — Care Management Important Message (Signed)
Important Message  Patient Details  Name: Pedro Mccoy MRN: [id number] Date of Birth: [birth year]   Medicare Important Message Given:  Yes     Juliann Pulse A Nery Kalisz 05/27/2022, 8:35 AM

## 2022-05-27 NOTE — Progress Notes (Signed)
PHARMACY NOTE:  ANTIMICROBIAL RENAL DOSAGE ADJUSTMENT  Current antimicrobial regimen includes a mismatch between antimicrobial dosage and estimated renal function.  As per policy approved by the Pharmacy & Therapeutics and Medical Executive Committees, the antimicrobial dosage will be adjusted accordingly.  Current antimicrobial dosage:  Cefazolin 2 g IV q8H  Indication: MSSA discitis/epidural abscess  Renal Function:  Estimated Creatinine Clearance: 24.2 mL/min (A) (by C-G formula based on SCr of 3.16 mg/dL (H)). '[]'$      On intermittent HD, scheduled: '[]'$      On CRRT    Antimicrobial dosage has been changed to:  Cefazolin 2 g IV q12H  Additional comments: Scr 2.31 >> 3.61. Pt had CT without contrast performed yesterday. Pt had furosemide 20 mg IV twice daily ordered which has been discontinued.  Thank you for allowing pharmacy to be a part of this patient's care.  Will M. Ouida Sills, PharmD PGY-1 Pharmacy Resident 05/27/2022 3:09 PM

## 2022-05-27 NOTE — Progress Notes (Signed)
   05/27/22 1300  Clinical Encounter Type  Visited With Patient  Visit Type Initial  Referral From Nurse  Consult/Referral To Chaplain   Chaplain responded to [facility] consult to provide AD information. Patient appeared sleepy and not able to engage much at the time of visit. AD information left at bedside. Chaplain services available to provide further assistance when needed.

## 2022-05-28 ENCOUNTER — Encounter: Admission: EM | Disposition: E | Payer: Self-pay | Source: Home / Self Care | Attending: Internal Medicine

## 2022-05-28 DIAGNOSIS — T796XXA Traumatic ischemia of muscle, initial encounter: Secondary | ICD-10-CM | POA: Diagnosis not present

## 2022-05-28 DIAGNOSIS — G061 Intraspinal abscess and granuloma: Secondary | ICD-10-CM | POA: Diagnosis not present

## 2022-05-28 DIAGNOSIS — Z66 Do not resuscitate: Secondary | ICD-10-CM

## 2022-05-28 DIAGNOSIS — M4646 Discitis, unspecified, lumbar region: Secondary | ICD-10-CM | POA: Diagnosis not present

## 2022-05-28 DIAGNOSIS — C259 Malignant neoplasm of pancreas, unspecified: Secondary | ICD-10-CM | POA: Diagnosis not present

## 2022-05-28 DIAGNOSIS — M4620 Osteomyelitis of vertebra, site unspecified: Secondary | ICD-10-CM | POA: Diagnosis not present

## 2022-05-28 DIAGNOSIS — K6812 Psoas muscle abscess: Secondary | ICD-10-CM | POA: Diagnosis not present

## 2022-05-28 LAB — AEROBIC/ANAEROBIC CULTURE W GRAM STAIN (SURGICAL/DEEP WOUND)

## 2022-05-28 LAB — CBC WITH DIFFERENTIAL/PLATELET
Abs Immature Granulocytes: 0.44 10*3/uL — ABNORMAL HIGH (ref 0.00–0.07)
Basophils Absolute: 0.1 10*3/uL (ref 0.0–0.1)
Basophils Relative: 1 %
Eosinophils Absolute: 0.1 10*3/uL (ref 0.0–0.5)
Eosinophils Relative: 0 %
HCT: 32 % — ABNORMAL LOW (ref 39.0–52.0)
Hemoglobin: 10.4 g/dL — ABNORMAL LOW (ref 13.0–17.0)
Immature Granulocytes: 3 %
Lymphocytes Relative: 5 %
Lymphs Abs: 0.8 10*3/uL (ref 0.7–4.0)
MCH: 26.8 pg (ref 26.0–34.0)
MCHC: 32.5 g/dL (ref 30.0–36.0)
MCV: 82.5 fL (ref 80.0–100.0)
Monocytes Absolute: 1 10*3/uL (ref 0.1–1.0)
Monocytes Relative: 6 %
Neutro Abs: 13 10*3/uL — ABNORMAL HIGH (ref 1.7–7.7)
Neutrophils Relative %: 85 %
Platelets: 214 10*3/uL (ref 150–400)
RBC: 3.88 MIL/uL — ABNORMAL LOW (ref 4.22–5.81)
RDW: 14.1 % (ref 11.5–15.5)
WBC: 15.4 10*3/uL — ABNORMAL HIGH (ref 4.0–10.5)
nRBC: 0 % (ref 0.0–0.2)

## 2022-05-28 LAB — C-REACTIVE PROTEIN: CRP: 18.1 mg/dL — ABNORMAL HIGH (ref <1.0)

## 2022-05-28 LAB — PROCALCITONIN: Procalcitonin: 1.86 ng/mL

## 2022-05-28 LAB — RENAL FUNCTION PANEL
Albumin: 1.7 g/dL — ABNORMAL LOW (ref 3.5–5.0)
Anion gap: 12 (ref 5–15)
BUN: 91 mg/dL — ABNORMAL HIGH (ref 8–23)
CO2: 19 mmol/L — ABNORMAL LOW (ref 22–32)
Calcium: 7.5 mg/dL — ABNORMAL LOW (ref 8.9–10.3)
Chloride: 102 mmol/L (ref 98–111)
Creatinine, Ser: 4.05 mg/dL — ABNORMAL HIGH (ref 0.61–1.24)
GFR, Estimated: 15 mL/min — ABNORMAL LOW (ref >60)
Glucose, Bld: 169 mg/dL — ABNORMAL HIGH (ref 70–99)
Phosphorus: 8 mg/dL — ABNORMAL HIGH (ref 2.5–4.6)
Potassium: 4.3 mmol/L (ref 3.5–5.1)
Sodium: 133 mmol/L — ABNORMAL LOW (ref 135–145)

## 2022-05-28 LAB — SEDIMENTATION RATE: Sed Rate: 60 mm/hr — ABNORMAL HIGH (ref 0–20)

## 2022-05-28 LAB — GLUCOSE, CAPILLARY
Glucose-Capillary: 164 mg/dL — ABNORMAL HIGH (ref 70–99)
Glucose-Capillary: 199 mg/dL — ABNORMAL HIGH (ref 70–99)
Glucose-Capillary: 192 mg/dL — ABNORMAL HIGH (ref 70–99)
Glucose-Capillary: 173 mg/dL — ABNORMAL HIGH (ref 70–99)

## 2022-05-28 SURGERY — ECHOCARDIOGRAM, TRANSESOPHAGEAL
Anesthesia: Moderate Sedation

## 2022-05-28 MED ORDER — METOPROLOL SUCCINATE ER 100 MG PO TB24
100.0000 mg | ORAL_TABLET | Freq: Every day | ORAL | Status: DC
  Filled 2022-05-28: qty 1

## 2022-05-28 MED ORDER — SODIUM CHLORIDE 0.9 % IV BOLUS
1000.0000 mL | Freq: Once | INTRAVENOUS | Status: AC
  Administered 2022-05-29: 1000 mL via INTRAVENOUS

## 2022-05-28 MED ORDER — DILTIAZEM HCL ER COATED BEADS 120 MG PO CP24
240.0000 mg | ORAL_CAPSULE | Freq: Every day | ORAL | Status: DC
  Filled 2022-05-28: qty 2

## 2022-05-28 MED ORDER — SODIUM CHLORIDE 0.9 % IV BOLUS
500.0000 mL | Freq: Once | INTRAVENOUS | Status: AC
  Administered 2022-05-28: 500 mL via INTRAVENOUS

## 2022-05-28 NOTE — Progress Notes (Signed)
   05/29/2022 2322  Assess: MEWS Score  Temp 98.1 F (36.7 C)  BP (!) 74/57  MAP (mmHg) (!) 64  ECG Heart Rate (!) 117  Resp 14  Level of Consciousness Alert  SpO2 98 %  O2 Device Nasal Cannula  O2 Flow Rate (L/min) 2 L/min  Assess: MEWS Score  MEWS Temp 0  MEWS Systolic 2  MEWS Pulse 2  MEWS RR 0  MEWS LOC 0  MEWS Score 4  MEWS Score Color Red  Assess: if the MEWS score is Yellow or Red  Were vital signs taken at a resting state? Yes  Focused Assessment No change from prior assessment  Does the patient meet 2 or more of the SIRS criteria? Yes  Does the patient have a confirmed or suspected source of infection? Yes  Provider and Rapid Response Notified? Yes  MEWS guidelines implemented *See Row Information* No, other (Comment) (pt is DNR w/ palliative consult)  Take Vital Signs  Increase Vital Sign Frequency  Red: Q 1hr X 4 then Q 4hr X 4, if remains red, continue Q 4hrs  Escalate  MEWS: Escalate Red: discuss with charge nurse/RN and provider, consider discussing with RRT  Notify: Charge Nurse/RN  Name of Charge Nurse/RN Notified Christine RN  Date Charge Nurse/RN Notified 05/21/2022  Time Charge Nurse/RN Notified 2334  Provider Notification  Provider Name/Title K Foust  Date Provider Notified 05/03/2022  Time Provider Notified 2334  Method of Notification Page (secure chat)  Notification Reason Other (Comment) (low p/b, pt also requesting for pain medicaton)  Assess: SIRS CRITERIA  SIRS Temperature  0  SIRS Pulse 1  SIRS Respirations  0  SIRS WBC 1  SIRS Score Sum  2

## 2022-05-28 NOTE — Progress Notes (Signed)
[facility]  ROUNDING NOTE   Subjective:   Patient seen sitting up in bed Ill appearing No family at bedside Room air  Creatinine 4.1 UOP 560m overnight  Objective:  Vital signs in last 24 hours:  Temp:  [97.8 F (36.6 C)-98.5 F (36.9 C)] 98.2 F (36.8 C) (11/29 1147) Pulse Rate:  [120-126] 120 (11/29 1147) Resp:  [17-24] 20 (11/29 1147) BP: (85-113)/(64-90) 113/90 (11/29 1147) SpO2:  [97 %-99 %] 97 % (11/29 1147)  Weight change:  Filed Weights   05/27/2022 1042 05/23/22 0922  Weight: 82.1 kg 82 kg    Intake/Output: I/O last 3 completed shifts: In: 3075.9 [P.O.:540; I.V.:1036; IV Piggyback:1499.9] Out: 500 [Urine:500]   Intake/Output this shift:  Total I/O In: 120 [P.O.:120] Out: -   Physical Exam: General: NAD, ill appearing  Head: Normocephalic, atraumatic. Moist oral mucosal membranes  Eyes: Anicteric  Lungs:  Clear to auscultation, normal effort  Heart: Regular rate and rhythm  Abdomen:  Soft, nontender  Extremities:  No peripheral edema.  Neurologic: Nonfocal, moving all four extremities  Skin: No lesions  Access: None    Basic Metabolic Panel: Recent Labs  Lab 05/23/22 0419 05/24/22 0411 05/25/22 1648 05/26/22 0405 05/27/22 0429 05/27/2022 0558  NA 141 135  --  137 135 133*  K 2.9* 3.2* 3.7 3.6 4.0 4.3  CL 107 102  --  103 104 102  CO2 24 24  --  22 20* 19*  GLUCOSE 161* 167*  --  173* 146* 169*  BUN 28* 34*  --  62* 74* 91*  CREATININE 1.08 1.15  --  2.31* 3.16* 4.05*  CALCIUM 7.9* 7.5*  --  7.9* 7.1* 7.5*  MG  --   --   --  2.5*  --   --   PHOS  --   --   --   --   --  8.0*    Liver Function Tests: Recent Labs  Lab 05/20/2022 0558  ALBUMIN 1.7*   No results for input(s): "LIPASE", "AMYLASE" in the last 168 hours. No results for input(s): "AMMONIA" in the last 168 hours.  CBC: Recent Labs  Lab 05/23/22 0419 05/24/22 0411 05/26/22 0405 05/27/22 0429 05/27/2022 0558  WBC 20.2* 18.4* 14.4* 13.8* 15.4*  NEUTROABS   --  14.6* 11.9* 11.4* 13.0*  HGB 12.6* 11.8* 12.2* 11.1* 10.4*  HCT 37.2* 34.6* 36.6* 33.4* 32.0*  MCV 83.4 84.0 83.2 83.9 82.5  PLT 244 224 220 197 214    Cardiac Enzymes: Recent Labs  Lab 05/27/22 2122  CKTOTAL 107    BNP: Invalid input(s): "POCBNP"  CBG: Recent Labs  Lab 05/27/22 1135 05/27/22 1606 05/27/22 2102 05/27/2022 0815 05/17/2022 1140  GLUCAP 167* 156* 210* 164* 199*    Microbiology: Results for orders placed or performed during the hospital encounter of 05/11/2022  Urine Culture     Status: Abnormal   Collection Time: 05/29/2022 12:57 PM   Specimen: Urine, Clean Catch  Result Value Ref Range Status   Specimen Description URINE, CLEAN CATCH  Final   Special Requests NONE  Final   Culture >=100,000 COLONIES/mL STAPHYLOCOCCUS AUREUS (A)  Final   Report Status 05/18/2022 FINAL  Final   Organism ID, Bacteria STAPHYLOCOCCUS AUREUS (A)  Final      Susceptibility   Staphylococcus aureus - MIC*    CIPROFLOXACIN <=0.5 SENSITIVE Sensitive     GENTAMICIN <=0.5 SENSITIVE Sensitive     NITROFURANTOIN <=16 SENSITIVE Sensitive     OXACILLIN <=0.25 SENSITIVE Sensitive  TETRACYCLINE <=1 SENSITIVE Sensitive     VANCOMYCIN <=0.5 SENSITIVE Sensitive     TRIMETH/SULFA <=10 SENSITIVE Sensitive     CLINDAMYCIN <=0.25 SENSITIVE Sensitive     RIFAMPIN <=0.5 SENSITIVE Sensitive     Inducible Clindamycin NEGATIVE Sensitive     * >=100,000 COLONIES/mL STAPHYLOCOCCUS AUREUS  Culture, blood (single)     Status: None   Collection Time: 05/24/2022  3:00 PM   Specimen: BLOOD  Result Value Ref Range Status   Specimen Description BLOOD BLOOD RIGHT ARM  Final   Special Requests   Final    BOTTLES DRAWN AEROBIC AND ANAEROBIC Blood Culture results may not be optimal due to an excessive volume of blood received in culture bottles   Culture   Final    NO GROWTH 5 DAYS Performed at [facility], [address], [place], [place] [address]    Report Status 05/21/2022 FINAL  Final   Culture, blood (Routine X 2) w Reflex to ID Panel     Status: None   Collection Time: 05/22/22  6:10 PM   Specimen: BLOOD  Result Value Ref Range Status   Specimen Description BLOOD RIGHT ANTECUBITAL  Final   Special Requests   Final    BOTTLES DRAWN AEROBIC AND ANAEROBIC Blood Culture adequate volume   Culture   Final    NO GROWTH 5 DAYS Performed at [facility], [address], [place], [place] [address]    Report Status 05/27/2022 FINAL  Final  Culture, blood (Routine X 2) w Reflex to ID Panel     Status: None   Collection Time: 05/22/22  6:10 PM   Specimen: BLOOD  Result Value Ref Range Status   Specimen Description BLOOD BLOOD RIGHT HAND  Final   Special Requests   Final    BOTTLES DRAWN AEROBIC AND ANAEROBIC Blood Culture adequate volume   Culture   Final    NO GROWTH 5 DAYS Performed at [facility], [address], [place], [place] [address]    Report Status 05/27/2022 FINAL  Final  Aerobic/Anaerobic Culture w Gram Stain (surgical/deep wound)     Status: None (Preliminary result)   Collection Time: 05/23/22 10:09 AM   Specimen: Abscess  Result Value Ref Range Status   Specimen Description   Final    ABSCESS Performed at [facility], [address], [place], [place] [address]    Special Requests   Final    NONE Performed at [facility], [address] [place], [place] [address]    Gram Stain   Final    ABUNDANT WBC PRESENT, PREDOMINANTLY PMN FEW GRAM POSITIVE COCCI IN PAIRS Performed at [facility], [place] [address] [place], [place] [address]    Culture   Final    FEW STAPHYLOCOCCUS AUREUS NO ANAEROBES ISOLATED; CULTURE IN PROGRESS FOR 5 DAYS    Report Status PENDING  Incomplete   Organism ID, Bacteria STAPHYLOCOCCUS AUREUS  Final      Susceptibility   Staphylococcus aureus - MIC*    CIPROFLOXACIN <=0.5 SENSITIVE Sensitive     ERYTHROMYCIN <=0.25 SENSITIVE Sensitive     GENTAMICIN <=0.5 SENSITIVE Sensitive      OXACILLIN <=0.25 SENSITIVE Sensitive     TETRACYCLINE <=1 SENSITIVE Sensitive     VANCOMYCIN <=0.5 SENSITIVE Sensitive     TRIMETH/SULFA <=10 SENSITIVE Sensitive     CLINDAMYCIN <=0.25 SENSITIVE Sensitive     RIFAMPIN <=0.5 SENSITIVE Sensitive     Inducible Clindamycin NEGATIVE Sensitive     *  FEW STAPHYLOCOCCUS AUREUS  Culture, blood (Routine X 2) w Reflex to ID Panel     Status: None (Preliminary result)   Collection Time: 05/26/22  9:05 AM   Specimen: BLOOD RIGHT HAND  Result Value Ref Range Status   Specimen Description BLOOD RIGHT HAND  Final   Special Requests   Final    BOTTLES DRAWN AEROBIC AND ANAEROBIC Blood Culture adequate volume   Culture   Final    NO GROWTH 2 DAYS Performed at [facility], [address] [place], [place] [address]    Report Status PENDING  Incomplete  Culture, blood (Routine X 2) w Reflex to ID Panel     Status: None (Preliminary result)   Collection Time: 05/26/22  9:12 AM   Specimen: BLOOD RIGHT FOREARM  Result Value Ref Range Status   Specimen Description BLOOD RIGHT FOREARM  Final   Special Requests   Final    BOTTLES DRAWN AEROBIC AND ANAEROBIC Blood Culture adequate volume   Culture   Final    NO GROWTH 2 DAYS Performed at [facility], [address], [place], [place] [address]    Report Status PENDING  Incomplete    Coagulation Studies: No results for input(s): "LABPROT", "INR" in the last 72 hours.  Urinalysis: Recent Labs    05/26/22 1230  COLORURINE AMBER*  LABSPEC 1.017  PHURINE 5.0  GLUCOSEU NEGATIVE  HGBUR LARGE*  BILIRUBINUR NEGATIVE  KETONESUR NEGATIVE  PROTEINUR 100*  NITRITE NEGATIVE  LEUKOCYTESUR MODERATE*      Imaging: US RENAL  Result Date: 05/27/2022 CLINICAL DATA:  Acute kidney injury EXAM: RENAL / URINARY TRACT ULTRASOUND COMPLETE COMPARISON:  05/09/2022 FINDINGS: Right Kidney: Renal measurements: 12.6 x 5.4 x 6.2 cm = volume: 220 mL. Echogenicity within normal limits. Small  right renal cysts which do not require follow-up imaging. No mass or hydronephrosis visualized. Left Kidney: Renal measurements: 13.2 x 5.9 x 6.4 cm = volume: 259 mL. Echogenicity within normal limits. No mass or hydronephrosis visualized. Bladder: Decompressed by Foley catheter. Other: None. IMPRESSION: No evidence of obstructive uropathy. Electronically Signed   By: Davina Poke D.O.   On: 05/27/2022 09:50     Medications:     ceFAZolin (ANCEF) IV 2 g (05/15/2022 0910)    apixaban  5 mg Oral BID   Chlorhexidine Gluconate Cloth  6 each Topical Daily   diltiazem  360 mg Oral Daily   FLUoxetine  40 mg Oral Daily   fluvoxaMINE  100 mg Oral BID   insulin aspart  0-5 Units Subcutaneous QHS   insulin aspart  0-9 Units Subcutaneous TID WC   lamoTRIgine  200 mg Oral BID   lidocaine  1 patch Transdermal Q24H   metoprolol succinate  125 mg Oral Daily   omega-3 acid ethyl esters  1 g Oral Daily   polyethylene glycol  17 g Oral Daily   risperiDONE  3 mg Oral QHS   hydrALAZINE, HYDROmorphone (DILAUDID) injection, LORazepam, metoprolol tartrate, ondansetron (ZOFRAN) IV, mouth rinse, oxyCODONE, polyvinyl alcohol  Assessment/ Plan:  Mr. Dorrell Mitcheltree is a 71 y.o.  male  with past medical conditions including hyperlipidemia, hypertension, diabetes, depression and anxiety, schizophrenia, tardive dyskinesia, and metastatic pancreatic cancer on chemo, who was admitted to [facility] on 05/17/2022 for Rhabdomyolysis [M62.82] Traumatic rhabdomyolysis, initial encounter ([place]) [T79.6XXA]   Acute kidney injury with hematuria/proteinuria appears to be multifactorial secondary to sepsis and hemodynamic instability resulting in ATN. Renal ultrasound negative for obstruction. History of hematuria however RBCs present  with this admission. Patient currently prescribed Eliquis. Will defer autoimmune work-up at this time. Continue to hold diuresis. Creatinine continues to worsen. Appreciate palliative care assisting with goal  of care. Will not pursue dialysis at this time.   Lab Results  Component Value Date   CREATININE 4.05 (H) 05/11/2022   CREATININE 3.16 (H) 05/27/2022   CREATININE 2.31 (H) 05/26/2022    Intake/Output Summary (Last 24 hours) at 05/04/2022 1343 Last data filed at 05/15/2022 0900 Gross per 24 hour  Intake 660 ml  Output 500 ml  Net 160 ml   2.  Acute pyelonephritis with urinary retention.  Urine cultures positive for MSSA and patient currently treated with IV Ancef.  Foley catheter placed.   3.  Rhabdomyolysis, CK peak 22,000 and is now 847.  Foley catheter. Improving    LOS: 12   11/29/20231:43 PM

## 2022-05-28 NOTE — Progress Notes (Signed)
Palliative Care Progress Note, Assessment & Plan   Patient Name: Pedro Mccoy       Date: 05/19/2022 DOB: [birth year]  Age: 71 y.o. MRN#: [id number] Attending Physician: Emeterio Reeve, DO Primary Care Physician: Romualdo Bolk, FNP Admit Date: 05/20/2022  Reason for Consultation/Follow-up: Establishing goals of care  Subjective: Patient is lying in bed in no apparent distress.  He acknowledges my presence but quickly closes his eyes and returns to sleep.  He is appears frail and weak.  When asked that he has gotten out of the bed he said "I do not want to".  Patient Sister Pedro Mccoy is at bedside.  HPI: 72 y.o. male  with past medical history of prostate cancer with known metastasis to liver, HTN, type 2 diabetes, schizophrenia (visual hallucinations), tardive dyskinesia, and right total hip replacement admitted on 05/10/2022 with weakness and falls (X2) at home.   Patient is being treated for septic shock secondary to pyelonephritis as well as L3 vertebral fracture, rhabdomyolysis, and urine cultures positive for MSSA.   1/19, patient became tachycardic, failed to respond to metoprolol and IVF, and was placed on Cardizem gtt. He has since been converted to oral cardizem.    11/23, WBC levels continue to rise. MRI of spine revealed L2/L3 discitis/osteomyelitis with associated epidural large left psoas abscess. Pt was placed in NPO status, Eliquis held, and IR consulted for consideration of percutaneous drain.    On 11/24, pt had CT with aspiration of left psoas abscess without complication. Pt receiving IV abx.    Given trending up of creatinine, nephrology has been consulted.Given rapid HR in AM of 11/27, cardiology has been consulted. TEE to be completed.    PMT was consulted to discuss goals of  care.  Summary of counseling/coordination of care: After reviewing the patient's chart and assessing the patient at bedside, I discussed prognosis, diagnosis, and anticipatory care needs with patient and Pedro Mccoy.   In regards to symptom management, patient states that he has the same amount of pain in his low back as he always had.  He says nothing has changed, it does not radiate, and there is no new pain in his other areas of his body. Pt has recevied oxy IR at 0532 this AM and is eligible to receive additional pain medications now. However, he quickly falls back asleep. No non-verbal signs of pain noted, such as fidgeting, wincing, or brow furrowing.   I attempted to elicit goals and values important to the patient.  He was minimally participatory during our discussion.  His Sister Pedro Mccoy says she knows that his prognosis is poor but that she is choosing to stay positive.  She believes that he shuts down on others but that he wholeheartedly trust her.  She has maintained only telling him positive and hopeful things.  We discussed importance of patient's functional and nutritional status.  Patient has declined to mobilize and get out of the bed.  Additionally, he has only had sips of diet Pepsi.  Pedro Mccoy shares she has ordered soup and soft things and is continuing to encourage him to have increased p.o. intake.  We discussed patient's CODE STATUS.  Patient) to confirm that in the event of a  cardiopulmonary arrest that patient would not want to receive CPR, shocks, or use of mechanical ventilation to sustain his life.  Patient was clear that in the event that his heart and his lungs stop that he would want to allow a natural death.  Patient's sister at bedside was in agreement.  CODE STATUS changed to DNR.  Attending and RN made aware.  Pedro Mccoy shares she has been working to complete an advanced directive, living will, and MOST form.  She is scheduled for a mobile notary to come bedside today to help  with this paperwork.  I offered to review any paperwork and she has no additional questions today.  PMT will continue to follow the patient throughout his hospitalization.  Physical Exam Vitals reviewed.  Constitutional:      General: He is not in acute distress. HENT:     Head: Normocephalic.     Nose: Nose normal.     Mouth/Throat:     Mouth: Mucous membranes are moist.  Eyes:     Pupils: Pupils are equal, round, and reactive to light.  Cardiovascular:     Rate and Rhythm: Normal rate.     Pulses: Normal pulses.  Pulmonary:     Effort: Pulmonary effort is normal.  Abdominal:     Palpations: Abdomen is soft.  Musculoskeletal:     Comments: Generalized weakness  Skin:    General: Skin is warm and dry.  Neurological:     Mental Status: He is alert and oriented to person, place, and time.  Psychiatric:        Mood and Affect: Mood normal.        Behavior: Behavior normal.        Thought Content: Thought content normal.        Judgment: Judgment normal.             Palliative Assessment/Data: 40%    Total Time 50 minutes  Greater than 50%  of this time was spent counseling and coordinating care related to the above assessment and plan.  Thank you for allowing the Palliative Medicine Team to assist in the care of this patient.  Escalon Ilsa Iha, FNP-BC Palliative Medicine Team Team Phone # [phone]

## 2022-05-28 NOTE — Progress Notes (Signed)
OT Cancellation Note  Patient Details Name: Pedro Mccoy MRN: [id number] DOB: August 01, 1950   Cancelled Treatment:    Reason Eval/Treat Not Completed: Patient declined, no reason specified. RN reports pt is lethargic and continues to have elevated HR. OT arrived to patient's room and he verbalized that he did not want to work with therapy today. Pt has had medical issues requiring hold the last two attempts and pt refusal to participate on third attempt. OT to complete order at this time until pt is medically able to participate in OT intervention. Please re-consult when appropriate.   Darleen Crocker, MS, OTR/L , CBIS ascom [phone]  05/13/2022, 3:19 PM

## 2022-05-28 NOTE — Progress Notes (Signed)
CSW received phone call from [place], [employer], for [employer];  CSW provided number via phone.  [place], [place]

## 2022-05-28 NOTE — Progress Notes (Signed)
PT Cancellation Note  Patient Details Name: Kaelum Kissick MRN: [id number] DOB: 02-04-1951   Cancelled Treatment:    Reason Eval/Treat Not Completed: Patient's level of consciousness: Per nursing pt remains lethargic with elevated resting HR, PT hold at this time.  Will attempt to see pt at a future date/time as medically appropriate.    Linus Salmons PT, DPT 05/24/2022, 2:26 PM

## 2022-05-28 NOTE — Progress Notes (Signed)
Date of Admission:  05/26/2022      ID: Pedro Mccoy is a 71 y.o. male  Principal Problem:   Rhabdomyolysis Active Problems:   Schizophrenia ([place])   Fall at home, initial encounter   Pancreatic cancer ([place])   HTN (hypertension)   Abdominal pain   Abnormal LFTs   Depression with anxiety   HLD (hyperlipidemia)   Diabetes mellitus without complication ([place])   UTI (urinary tract infection)   Septic shock ([facility])   Acute pyelonephritis   Lumbar compression fracture, closed, initial encounter [facility])   Atrial flutter ([place])   Acute urinary retention   Lumbar discitis   Vertebral osteomyelitis ([place])   Psoas abscess, left ([place])   Spinal epidural abscess   AKI (acute kidney injury) ([place])   Acute metabolic encephalopathy  Ca pancreas ( a pancreatic mass incidentally noted on CT when he had a rt hip fracture in Nov 2022, a repeat CT in Aug 2023 showed increase in the size and he underwent 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   He presented to the ED on 05/13/2022 after a fall and initially refused to come to ED and later was brought in as he had fallen again and could not get off the floor 1 set blood culture NG UA 11-20 WBC CT abdomen Distended bladder Possible rt pyelo Has foley placed in the hospital UC MSSA MRI lumbar spine showed L2-L3 discitis and osteo, psoas abscess on the left and epidural abscess Pt underwent IR procedure on 05/23/22 - 10cc of pus aspirated from the psoas abscess land it is MSSA  Subjective: Awake, fatigued, feeble talk   Medications:   apixaban  5 mg Oral BID   Chlorhexidine Gluconate Cloth  6 each Topical Daily   diltiazem  360 mg Oral Daily   FLUoxetine  40 mg Oral Daily   fluvoxaMINE  100 mg Oral BID   insulin aspart  0-5 Units Subcutaneous QHS   insulin aspart   0-9 Units Subcutaneous TID WC   lamoTRIgine  200 mg Oral BID   lidocaine  1 patch Transdermal Q24H   metoprolol succinate  125 mg Oral Daily   omega-3 acid ethyl esters  1 g Oral Daily   polyethylene glycol  17 g Oral Daily   risperiDONE  3 mg Oral QHS    Objective: Vital signs in last 24 hours: Temp:  [97.8 F (36.6 C)-98.5 F (36.9 C)] 98.2 F (36.8 C) (11/29 1147) Pulse Rate:  [120-126] 120 (11/29 1147) Resp:  [17-24] 20 (11/29 1147) BP: (85-113)/(64-90) 113/90 (11/29 1147) SpO2:  [97 %-99 %] 97 % (11/29 1147)  [facility] PORT- has not been accessed  PHYSICAL EXAM:  General: Awake, weak, responds appropriately to questions , Patient Vitals for the past 24 hrs:  BP Temp Temp src Pulse Resp SpO2  05/20/2022 1147 (!) 113/90 98.2 F (36.8 C) -- (!) 120 20 97 %  05/07/2022 0821 (!) 85/64 98.3 F (36.8 C) Oral (!) 124 20 97 %  05/09/2022 0348 100/73 98.5 F (36.9 C) Axillary (!) 124 17 97 %  05/24/2022 0201 95/76 -- -- (!) 126 18 --  05/09/2022 0003 101/74 98 F (36.7 C) Axillary (!) 124 18 99 %  05/27/22 2151 -- 98 F (36.7 C) Oral -- -- --  05/27/22 2105 104/78 -- -- (!) 124 17 98 %  05/27/22 1604 98/79 97.8 F (36.6 C) Oral (!) 123 (!) 24 99 %     Left scalp carpet burn like injury .  Sacrum deep tissue injury  Back: sacrum- DTI Lungs:b/l air entry Heart: irregular Abdomen: Soft, non-tender,not distended. Bowel sounds normal. No masses Lymph: Cervical, supraclavicular normal. 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 MRI shows L2-L3  discitis and osteomyelitis of L2 and most of L3 Left psoas abscess Epidural abscess  L2-L4 Pathological Compression fracture  L3 IR aspirated the psoas abscess and it is MSSA On cefazolin Continue cefazolin- will need for 6-8 weeks and PO after that Recommend TEE to look for endocarditis especially with PORT in place  repeat blood cultures- neg  AKI - worsening- renal on board- US kidney N Rhabdo has resolved  Afib  Bipolar disorder/schizophrenia  H/o rt hip fracture s/p THA Nov 2022  Discussed the management with care team

## 2022-05-28 NOTE — Progress Notes (Addendum)
PROGRESS NOTE    Pedro Mccoy   ZOX:[id number] DOB: 1950/10/30  DOA: 05/27/2022 Date of Service: 04/30/2022 PCP: Romualdo Bolk, FNP     Brief Narrative / Hospital Course:  71 year old male with past medical history of metastatic pancreatic cancer on chemotherapy, hypertension, diabetes mellitus, schizophrenia and tardive dyskinesia presented to the emergency room on 11/17 with complaints of weakness, fall x2 earlier that day and abdominal pain.  Work-up with septic shock secondary to pyelonephritis, acute versus subacute fracture of L3 vertebral body and rhabdomyolysis with CPK of 22,000.  Patient admitted to the hospitalist service and started on IV fluids and antibiotics.  By 11/19, urine cultures positive for MSSA.  Following admission, on the evening of 11/17, patient went to rapid atrial flutter.  Attempted treatment with metoprolol and IV fluids.   11/19: tachycardia persisting and patient started on Cardizem gtt.   11/20: HR better, remained on cardizem gtt  11/21: off Cardizem gtt, on oral metoprolol and cardizem. ID consulted for (+)UCx MSSA and cocnern for CT abd/pelvis w/ paraspinal edema, 1 set BCx (+) likely contaminant, advised consider MRI L spine.  11/23: White count continues to rise. No fevers. MRI L spine: L2L3 discitis noted, epidural abscess. Large psoas abscess. 11/24:  to IR, psoas abscess drained 11/25: White count downtrending. Neurosurgery consult - no surgical intervention planned d/t high risk, consider stabilization but may not be appropriate given metastatic disease  11/26: developed AKI Cr 1.15 --> 8.11 and metabolic encephalopathy.  11/28: Cr continues to worsen to 3.16, consulted nephrology 11/29: remains tachycardic in 120s, Cr 4.05, WBC up. Sister and patient agree for DNR status. Deferring dialysis at this time per nephrology. Holding diuresis.   Overall goals (see palliative notes for details): transfer to nursing facility, treat the treatable, "would  not want to live on a machine or feeding tube," DNR as of today 11/29  Consultants:  Infectious Discase  Palliative care Interventional Radiology  Neurosurgery Nephrology  Cardiology for TEE  Procedures: 05/23/22: CT guided drainage L psoas abscess w/ IR       ASSESSMENT & PLAN:   Principal Problem:   Rhabdomyolysis Active Problems:   Septic shock ([place])   UTI (urinary tract infection)   Acute pyelonephritis   Atrial flutter ([place])   Acute urinary retention   Pancreatic cancer ([place])   HLD (hyperlipidemia)   Lumbar compression fracture, closed, initial encounter ([place])   HTN (hypertension)   Schizophrenia ([place])   Depression with anxiety   Diabetes mellitus without complication ([facility])   Abdominal pain   Abnormal LFTs   Fall at home, initial encounter   Lumbar discitis   Vertebral osteomyelitis ([place])   Psoas abscess, left ([place])   Spinal epidural abscess   AKI (acute kidney injury) ([place])   Acute metabolic encephalopathy    Septic shock ([place]) L2-L3 osteomyelitis with epidural abscess Left psoas abscess S/p CT-guided drainage of left psoas abscess on 05/23/2022 Continue IV cefazolin. No growth on blood cultures. Urine culture with Staph aureus.   MRI spine with osteo/abscess Fluid culture from psoas abscess showed Staph aureus. DO NOT access port Plan for TEE but this has been put on hold because of patient's current condition     AKI - worsening  Creatinine has gotten worse, went from 2.31 --> 3.16 --> 4.05.  Renal ultrasound noshow any evidence of obstructive uropathy.   Continue IV fluids  monitor BMP Nephrology following - no dialysis at this time    Acute metabolic encephalopathy CT head didn't  show any acute abnormality.  No acidosis or hypercapnia on ABG.   Procalcitonin and lactic acid were unremarkable.   No growth thus far on repeat blood cultures from 05/26/2022.   UA is still abnormal but he has a Foley catheter in place.   Chest x-ray showed  left lower lung opacities which may represent atelectasis, pneumonia or perhaps a small left pleural effusion.   Continue IV antibiotics   Rhabdomyolysis Improved   Acute pyelonephritis Urine cultures positive for MSSA.   He is on IV Ancef. ID following    Atrial flutter ([place]), sinus tachycardia Weaned off IV Cardizem drip.   Continue oral metoprolol, Cardizem and Eliquis.     Acute urinary retention Foley catheter placed on 05/18/2022   Lumbar compression fracture, closed, initial encounter [facility]) Unclear whether this is compression fracture or pathologic in the setting of discitis.   patient is not a surgical candidate per Dr. Izora Ribas, neurosurgeon.   LSO brace ordered for patient comfort.   Pancreatic cancer ([place]) Metastatic mets to the liver.   Oncology following with recent chemotherapy.  Sees onc at [facility].   Next chemo scheduled for 11/30.  Poor functional status. Outpatient follow-up with oncologist and palliative care     Other comorbidities include schizophrenia, hypertension, type II DM   DVT prophylaxis: SCD, Eliquis Pertinent IV fluids/nutrition: none Central lines / invasive devices: Foley cath placed 11/19  Code Status: DNR  Disposition: inpatient  TOC needs: plan d/c to SNF Barriers to discharge / significant pending items: worsening AKI              Subjective:  Patient minimally interactive w/ interview/exam, he is sleepy but awakens to his name, answers yes/no questions and then soon tries to go back to sleep.  Denies CP/SOB.  Reports pain in back.  Denies new weakness.     Family Communication: will reach out to sister later today --> called 05/27/2022 5:43 PM and spoke w/ her    Objective Findings:  Vitals:   05/04/2022 0201 05/02/2022 0348 05/22/2022 0821 05/13/2022 1147  BP: 95/76 100/73 (!) 85/64 (!) 113/90  Pulse: (!) 126 (!) 124 (!) 124 (!) 120  Resp: '18 17 20 20  '$ Temp:  98.5 F (36.9 C) 98.3 F (36.8 C) 98.2 F (36.8 C)   TempSrc:  Axillary Oral   SpO2:  97% 97% 97%  Weight:      Height:        Intake/Output Summary (Last 24 hours) at 05/03/2022 1442 Last data filed at 05/26/2022 0900 Gross per 24 hour  Intake 660 ml  Output 500 ml  Net 160 ml   Filed Weights   05/27/2022 1042 05/23/22 0922  Weight: 82.1 kg 82 kg    Examination:  Constitutional:  VS as above General Appearance: alert when spoken to but sleepy, NAD Respiratory: Low respiratory effort No wheeze No rhonchi No rales Cardiovascular: S1/S2 normal No murmur No rub/gallop auscultated No lower extremity edema Gastrointestinal: No tenderness Musculoskeletal:  No clubbing/cyanosis of digits Neurological: No cranial nerve deficit on limited exam Alert Psychiatric: Fair judgment/insight Flat mood and affect       Scheduled Medications:   apixaban  5 mg Oral BID   Chlorhexidine Gluconate Cloth  6 each Topical Daily   diltiazem  360 mg Oral Daily   FLUoxetine  40 mg Oral Daily   fluvoxaMINE  100 mg Oral BID   insulin aspart  0-5 Units Subcutaneous QHS   insulin aspart  0-9 Units Subcutaneous  TID WC   lamoTRIgine  200 mg Oral BID   lidocaine  1 patch Transdermal Q24H   metoprolol succinate  125 mg Oral Daily   omega-3 acid ethyl esters  1 g Oral Daily   polyethylene glycol  17 g Oral Daily   risperiDONE  3 mg Oral QHS    Continuous Infusions:   ceFAZolin (ANCEF) IV 2 g (05/25/2022 0910)    PRN Medications:  hydrALAZINE, HYDROmorphone (DILAUDID) injection, LORazepam, metoprolol tartrate, ondansetron (ZOFRAN) IV, mouth rinse, oxyCODONE, polyvinyl alcohol  Antimicrobials:  Anti-infectives (From admission, onward)    Start     Dose/Rate Route Frequency Ordered Stop   05/27/22 2200  ceFAZolin (ANCEF) IVPB 2g/100 mL premix        2 g 200 mL/hr over 30 Minutes Intravenous Every 12 hours 05/27/22 1503     05/20/22 2200  ceFAZolin (ANCEF) IVPB 2g/100 mL premix  Status:  Discontinued        2 g 200 mL/hr over 30  Minutes Intravenous Every 8 hours 05/20/22 1457 05/27/22 1503   05/18/22 2000  ceFAZolin (ANCEF) IVPB 1 g/50 mL premix  Status:  Discontinued        1 g 100 mL/hr over 30 Minutes Intravenous Every 8 hours 05/18/22 1852 05/20/22 1457   05/12/2022 2000  cefTRIAXone (ROCEPHIN) 1 g in sodium chloride 0.9 % 100 mL IVPB  Status:  Discontinued        1 g 200 mL/hr over 30 Minutes Intravenous Every 24 hours 05/25/2022 1706 05/18/22 1852   05/08/2022 1145  ceFEPIme (MAXIPIME) 2 g in sodium chloride 0.9 % 100 mL IVPB        2 g 200 mL/hr over 30 Minutes Intravenous  Once 05/23/2022 1136 05/22/2022 1246           Data Reviewed: I have personally reviewed following labs and imaging studies  CBC: Recent Labs  Lab 05/23/22 0419 05/24/22 0411 05/26/22 0405 05/27/22 0429 05/03/2022 0558  WBC 20.2* 18.4* 14.4* 13.8* 15.4*  NEUTROABS  --  14.6* 11.9* 11.4* 13.0*  HGB 12.6* 11.8* 12.2* 11.1* 10.4*  HCT 37.2* 34.6* 36.6* 33.4* 32.0*  MCV 83.4 84.0 83.2 83.9 82.5  PLT 244 224 220 197 756   Basic Metabolic Panel: Recent Labs  Lab 05/23/22 0419 05/24/22 0411 05/25/22 1648 05/26/22 0405 05/27/22 0429 04/30/2022 0558  NA 141 135  --  137 135 133*  K 2.9* 3.2* 3.7 3.6 4.0 4.3  CL 107 102  --  103 104 102  CO2 24 24  --  22 20* 19*  GLUCOSE 161* 167*  --  173* 146* 169*  BUN 28* 34*  --  62* 74* 91*  CREATININE 1.08 1.15  --  2.31* 3.16* 4.05*  CALCIUM 7.9* 7.5*  --  7.9* 7.1* 7.5*  MG  --   --   --  2.5*  --   --   PHOS  --   --   --   --   --  8.0*   GFR: Estimated Creatinine Clearance: 18.9 mL/min (A) (by C-G formula based on SCr of 4.05 mg/dL (H)). Liver Function Tests: Recent Labs  Lab 05/27/2022 0558  ALBUMIN 1.7*   No results for input(s): "LIPASE", "AMYLASE" in the last 168 hours. No results for input(s): "AMMONIA" in the last 168 hours. Coagulation Profile: No results for input(s): "INR", "PROTIME" in the last 168 hours. Cardiac Enzymes: Recent Labs  Lab 05/27/22 2122  CKTOTAL  107   BNP (last 3  results) No results for input(s): "PROBNP" in the last 8760 hours. HbA1C: No results for input(s): "HGBA1C" in the last 72 hours. CBG: Recent Labs  Lab 05/27/22 1135 05/27/22 1606 05/27/22 2102 05/12/2022 0815 05/07/2022 1140  GLUCAP 167* 156* 210* 164* 199*   Lipid Profile: No results for input(s): "CHOL", "HDL", "LDLCALC", "TRIG", "CHOLHDL", "LDLDIRECT" in the last 72 hours. Thyroid Function Tests: No results for input(s): "TSH", "T4TOTAL", "FREET4", "T3FREE", "THYROIDAB" in the last 72 hours. Anemia Panel: No results for input(s): "VITAMINB12", "FOLATE", "FERRITIN", "TIBC", "IRON", "RETICCTPCT" in the last 72 hours. Most Recent Urinalysis On File:     Component Value Date/Time   COLORURINE AMBER (A) 05/26/2022 1230   APPEARANCEUR CLOUDY (A) 05/26/2022 1230   LABSPEC 1.017 05/26/2022 1230   PHURINE 5.0 05/26/2022 1230   GLUCOSEU NEGATIVE 05/26/2022 1230   HGBUR LARGE (A) 05/26/2022 1230   BILIRUBINUR NEGATIVE 05/26/2022 1230   KETONESUR NEGATIVE 05/26/2022 1230   PROTEINUR 100 (A) 05/26/2022 1230   NITRITE NEGATIVE 05/26/2022 1230   LEUKOCYTESUR MODERATE (A) 05/26/2022 1230   Sepsis Labs: '@LABRCNTIP'$ (procalcitonin:4,lacticidven:4)  Recent Results (from the past 240 hour(s))  Culture, blood (Routine X 2) w Reflex to ID Panel     Status: None   Collection Time: 05/22/22  6:10 PM   Specimen: BLOOD  Result Value Ref Range Status   Specimen Description BLOOD RIGHT ANTECUBITAL  Final   Special Requests   Final    BOTTLES DRAWN AEROBIC AND ANAEROBIC Blood Culture adequate volume   Culture   Final    NO GROWTH 5 DAYS Performed at [facility], [address], [place], [place] [address]    Report Status 05/27/2022 FINAL  Final  Culture, blood (Routine X 2) w Reflex to ID Panel     Status: None   Collection Time: 05/22/22  6:10 PM   Specimen: BLOOD  Result Value Ref Range Status   Specimen Description BLOOD BLOOD RIGHT HAND  Final   Special  Requests   Final    BOTTLES DRAWN AEROBIC AND ANAEROBIC Blood Culture adequate volume   Culture   Final    NO GROWTH 5 DAYS Performed at [facility], [address], [place], [place] [address]    Report Status 05/27/2022 FINAL  Final  Aerobic/Anaerobic Culture w Gram Stain (surgical/deep wound)     Status: None   Collection Time: 05/23/22 10:09 AM   Specimen: Abscess  Result Value Ref Range Status   Specimen Description   Final    ABSCESS Performed at [facility], [address], [place], [place] [address]    Special Requests   Final    NONE Performed at [facility], [address] [place], [place] [address]    Gram Stain   Final    ABUNDANT WBC PRESENT, PREDOMINANTLY PMN FEW GRAM POSITIVE COCCI IN PAIRS    Culture   Final    FEW STAPHYLOCOCCUS AUREUS NO ANAEROBES ISOLATED Performed at [facility], [place] [address] [place], [place] [address]    Report Status 05/26/2022 FINAL  Final   Organism ID, Bacteria STAPHYLOCOCCUS AUREUS  Final      Susceptibility   Staphylococcus aureus - MIC*    CIPROFLOXACIN <=0.5 SENSITIVE Sensitive     ERYTHROMYCIN <=0.25 SENSITIVE Sensitive     GENTAMICIN <=0.5 SENSITIVE Sensitive     OXACILLIN <=0.25 SENSITIVE Sensitive     TETRACYCLINE <=1 SENSITIVE Sensitive     VANCOMYCIN <=0.5 SENSITIVE Sensitive     TRIMETH/SULFA <=  10 SENSITIVE Sensitive     CLINDAMYCIN <=0.25 SENSITIVE Sensitive     RIFAMPIN <=0.5 SENSITIVE Sensitive     Inducible Clindamycin NEGATIVE Sensitive     * FEW STAPHYLOCOCCUS AUREUS  Culture, blood (Routine X 2) w Reflex to ID Panel     Status: None (Preliminary result)   Collection Time: 05/26/22  9:05 AM   Specimen: BLOOD RIGHT HAND  Result Value Ref Range Status   Specimen Description BLOOD RIGHT HAND  Final   Special Requests   Final    BOTTLES DRAWN AEROBIC AND ANAEROBIC Blood Culture adequate volume   Culture   Final    NO GROWTH 2 DAYS Performed at [facility],  [address] [place], [place] [address]    Report Status PENDING  Incomplete  Culture, blood (Routine X 2) w Reflex to ID Panel     Status: None (Preliminary result)   Collection Time: 05/26/22  9:12 AM   Specimen: BLOOD RIGHT FOREARM  Result Value Ref Range Status   Specimen Description BLOOD RIGHT FOREARM  Final   Special Requests   Final    BOTTLES DRAWN AEROBIC AND ANAEROBIC Blood Culture adequate volume   Culture   Final    NO GROWTH 2 DAYS Performed at [facility], [address] [place], [place] [address]    Report Status PENDING  Incomplete         Radiology Studies: DG Lumbar Spine 2-3 Views  Result Date: 05/09/2022 CLINICAL DATA:  Fall EXAM: LUMBAR SPINE - 2-3 VIEW COMPARISON:  CT 05/27/2022 FINDINGS: There are 5 non-rib-bearing lumbar vertebrae. There is a chronic compression fracture of L1 and and acute-subacute compression fracture of L3. There is multilevel degenerative disc disease, worst at L4-L5 and L5-S1 with moderate and severe disc height loss respectively. There is moderate lower lumbar predominant facet arthropathy. Partially visualized right hip arthroplasty. IMPRESSION: Acute-subacute compression fracture of L3 with up to 50% height loss. Chronic compression deformity of L1 without evidence of progressive height loss. Multilevel degenerative disc disease, moderate at L4-L5 and severe at L5-S1. Moderate lower lumbar predominant facet arthropathy. Electronically Signed   By: Maurine Simmering M.D.   On: 05/13/2022 17:09   CT ABDOMEN PELVIS W CONTRAST  Result Date: 05/20/2022 CLINICAL DATA:  Pancreatic cancer. Blunt abdominal trauma. Patient found down on floor incontinent to urine. Recent diagnosis of sepsis. * Tracking Code: BO * EXAM: CT ABDOMEN AND PELVIS WITH CONTRAST TECHNIQUE: Multidetector CT imaging of the abdomen and pelvis was performed using the standard protocol following bolus administration of intravenous contrast. RADIATION DOSE REDUCTION:  This exam was performed according to the departmental dose-optimization program which includes automated exposure control, adjustment of the mA and/or kV according to patient size and/or use of iterative reconstruction technique. CONTRAST:  111m OMNIPAQUE IOHEXOL 300 MG/ML  SOLN COMPARISON:  Radiographs from 05/14/2022 and CT scan 05/20/2021 FINDINGS: Lower chest: Mild dependent atelectasis or scarring. Hepatobiliary: 4 new hypodense lesions in the right hepatic lobe are identified, probably metastatic lesions. The largest measures 1.9 by 1.6 cm in segment 7 of the liver (image 14, series 2). Gallbladder unremarkable. No biliary dilatation. Pancreas: Substantial enlargement of the hypoenhancing infiltrative pancreatic mass, currently 6.6 by 4.2 cm on image 25 of series 2 and previously about 2.7 by 2.0 cm on 05/20/2021. This mass abuts portions of the splenic artery, the junction of the SMV and the portal vein, and nearly occludes the splenic vein as shown for example on image 49 series 5.  Spleen: Trace perisplenic ascites, otherwise unremarkable. Adrenals/Urinary Tract: Both adrenal glands appear normal. Stable small renal cysts warrant no further follow up. Subtle parenchymal hypoenhancement posteriorly in the right kidney upper pole on image 32 series 2, correlate with urine analysis in assessing for possible pyelonephritis. Distended urinary bladder. Stomach/Bowel: Unremarkable Vascular/Lymphatic: Vascular involvement of the enlarging pancreatic mass as noted above. Atherosclerosis is present, including aortoiliac atherosclerotic disease. Reproductive: Prostatomegaly. Other: No supplemental non-categorized findings. Musculoskeletal: Right total hip prosthesis noted. Acute or early subacute fracture of the L3 vertebral body with involvement of the superior endplate and extension to the middle column, with 1-2 mm posterior bony retropulsion. We demonstrate nearly 50% loss of vertebral body height and some mild  anterior fragmentation of the vertebral rim along with paraspinal edema/mild paraspinal hematoma. There is also a chronic compression fracture at the L4 vertebral level. Multilevel lumbar spondylosis and degenerative disc disease leading to multilevel impingement. Small umbilical hernia containing adipose tissue. Somewhat prominent piriformis and obturator internus musculature in the pelvis, left greater than right, cannot exclude mild sciatic notch impingement due to the left piriformis hypertrophy. IMPRESSION: 1. Substantial enlargement of the hypoenhancing infiltrative pancreatic mass, currently 6.6 by 4.2 cm, previously about 2.7 by 2.0 cm on 05/20/2021. This mass abuts portions of the splenic artery, the junction of the SMV and portal vein, and nearly occludes the splenic vein. 2. 4 new hypodense lesions in the right hepatic lobe are probably metastatic lesions. 3. Acute or early subacute fracture of the L3 vertebral body with involvement of the superior endplate and extension to the middle column, with nearly 50% loss of vertebral body height and some mild anterior fragmentation of the vertebral rim. 1-2 mm posterior bony retropulsion. Paraspinal edema noted. 4. Subtle parenchymal hypoenhancement posteriorly in the right kidney upper pole, correlate with urine analysis in assessing for possible pyelonephritis. 5. Distended urinary bladder. 6. Prostatomegaly. 7. Somewhat prominent piriformis and obturator internus musculature in the pelvis, left greater than right, cannot exclude left sciatic notch impingement due to the left piriformis hypertrophy. 8. Multilevel lumbar spondylosis and degenerative disc disease leading to multilevel impingement. 9. Small umbilical hernia containing adipose tissue. 10. Aortic atherosclerosis. Aortic Atherosclerosis (ICD10-I70.0). Electronically Signed   By: Van Clines M.D.   On: 05/01/2022 16:41   DG Hip Unilat W or Wo Pelvis 2-3 Views Left  Result Date:  05/13/2022 CLINICAL DATA:  Fall.  Evaluate for fracture. EXAM: DG HIP (WITH OR WITHOUT PELVIS) 2-3V LEFT COMPARISON:  Right hip radiographs 05/21/2021, pelvis and right hip radiographs 05/20/2021 FINDINGS: Status post total right hip arthroplasty. There is diffuse decreased bone mineralization. No perihardware lucency is seen to indicate radiographic evidence of hardware failure. Mild superolateral left acetabular degenerative osteophytosis. The bilateral sacroiliac, left femoroacetabular, and pubic symphysis joint spaces are maintained. Small sclerotic focus within the left ilium is unchanged from 05/20/2021, compatible with a benign bone island. No acute fracture is seen.  No dislocation. Moderate to severe right L4-5 disc space narrowing and right lateral endplate osteophytes. 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 No acute fracture is seen. Electronically Signed   By: Yvonne Kendall M.D.   On: 05/08/2022 11:32   DG Chest Port 1 View  Result Date: 05/05/2022 CLINICAL DATA:  Sepsis EXAM: PORTABLE CHEST 1 VIEW COMPARISON:  05/20/2021 FINDINGS: Right IJ approach chest port in place with distal tip at the level of the distal SVC. The heart size and mediastinal contours are within normal limits. Aortic atherosclerosis.  Both lungs are clear. The visualized skeletal structures are unremarkable. IMPRESSION: No active disease. Electronically Signed   By: Davina Poke D.O.   On: 05/17/2022 11:30   DG Shoulder Left  Result Date: 05/27/2022 CLINICAL DATA:  Fall.  Evaluate for fracture. EXAM: LEFT SHOULDER - 2+ VIEW COMPARISON:  None Available. FINDINGS: Severe acromioclavicular joint space narrowing with mild-to-moderate peripheral degenerative osteophytes. Mild glenohumeral joint space narrowing. Mild peripheral glenoid degenerative osteophytosis. No acute fracture is seen. No dislocation. The visualized portion of the left lung is unremarkable. IMPRESSION:  1. No acute fracture. 2. Moderate acromioclavicular and mild glenohumeral osteoarthritis. Electronically Signed   By: Yvonne Kendall M.D.   On: 05/04/2022 11:29   CT HEAD WO CONTRAST (5MM)  Result Date: 05/20/2022 CLINICAL DATA:  Head trauma, minor (Age >= 65y); Neck trauma (Age >= 65y) EXAM: CT HEAD WITHOUT CONTRAST CT CERVICAL SPINE WITHOUT CONTRAST TECHNIQUE: Multidetector CT imaging of the head and cervical spine was performed following the standard protocol without intravenous contrast. Multiplanar CT image reconstructions of the cervical spine were also generated. RADIATION DOSE REDUCTION: This exam was performed according to the departmental dose-optimization program which includes automated exposure control, adjustment of the mA and/or kV according to patient size and/or use of iterative reconstruction technique. COMPARISON:  None Available. FINDINGS: CT HEAD FINDINGS Brain: No evidence of acute infarction, hemorrhage, hydrocephalus, extra-axial collection or mass lesion/mass effect. Vascular: No hyperdense vessel identified. Skull: No acute fracture.  Left forehead contusion. Sinuses/Orbits: Paranasal sinus mucosal thickening. Other: No mastoid effusions. CT CERVICAL SPINE FINDINGS Alignment: Straightening.  No substantial sagittal subluxation. Skull base and vertebrae: Vertebral heights are maintained. No evidence of acute fracture. Soft tissues and spinal canal: No prevertebral fluid or swelling. No visible canal hematoma. Disc levels: Multilevel facet and uncovertebral hypertrophy there degrees neural foraminal stenosis. Multilevel degenerative disease. Upper chest: Visualized lung apices are clear. IMPRESSION: 1. No evidence of acute intracranial abnormality. 2. No evidence of acute fracture or traumatic malalignment in the cervical spine. Electronically Signed   By: Margaretha Sheffield M.D.   On: 05/24/2022 11:23   CT Cervical Spine Wo Contrast  Result Date: 05/10/2022 CLINICAL DATA:  Head  trauma, minor (Age >= 65y); Neck trauma (Age >= 65y) EXAM: CT HEAD WITHOUT CONTRAST CT CERVICAL SPINE WITHOUT CONTRAST TECHNIQUE: Multidetector CT imaging of the head and cervical spine was performed following the standard protocol without intravenous contrast. Multiplanar CT image reconstructions of the cervical spine were also generated. RADIATION DOSE REDUCTION: This exam was performed according to the departmental dose-optimization program which includes automated exposure control, adjustment of the mA and/or kV according to patient size and/or use of iterative reconstruction technique. COMPARISON:  None Available. FINDINGS: CT HEAD FINDINGS Brain: No evidence of acute infarction, hemorrhage, hydrocephalus, extra-axial collection or mass lesion/mass effect. Vascular: No hyperdense vessel identified. Skull: No acute fracture.  Left forehead contusion. Sinuses/Orbits: Paranasal sinus mucosal thickening. Other: No mastoid effusions. CT CERVICAL SPINE FINDINGS Alignment: Straightening.  No substantial sagittal subluxation. Skull base and vertebrae: Vertebral heights are maintained. No evidence of acute fracture. Soft tissues and spinal canal: No prevertebral fluid or swelling. No visible canal hematoma. Disc levels: Multilevel facet and uncovertebral hypertrophy there degrees neural foraminal stenosis. Multilevel degenerative disease. Upper chest: Visualized lung apices are clear. IMPRESSION: 1. No evidence of acute intracranial abnormality. 2. No evidence of acute fracture or traumatic malalignment in the cervical spine. Electronically Signed   By: Margaretha Sheffield M.D.   On: 05/22/2022 11:23  LOS: 12 days    Time spent: 50 minutes     Emeterio Reeve, DO [facility] 05/10/2022, 2:42 PM    Dictation software may have been used to generate the above note. Typos may occur and escape review in typed/dictated notes. Please contact Dr Sheppard Coil directly for clarity if  needed.  Staff may message me via secure chat in [place]  but this may not receive an immediate response,  please page me for urgent matters!  If 7PM-7AM, please contact night coverage [email or web address]

## 2022-05-29 DIAGNOSIS — N10 Acute tubulo-interstitial nephritis: Secondary | ICD-10-CM | POA: Diagnosis not present

## 2022-05-29 DIAGNOSIS — I484 Atypical atrial flutter: Secondary | ICD-10-CM | POA: Diagnosis not present

## 2022-05-29 DIAGNOSIS — E782 Mixed hyperlipidemia: Secondary | ICD-10-CM

## 2022-05-29 DIAGNOSIS — C259 Malignant neoplasm of pancreas, unspecified: Secondary | ICD-10-CM | POA: Diagnosis not present

## 2022-05-29 DIAGNOSIS — K6812 Psoas muscle abscess: Secondary | ICD-10-CM | POA: Diagnosis not present

## 2022-05-29 DIAGNOSIS — T796XXA Traumatic ischemia of muscle, initial encounter: Secondary | ICD-10-CM | POA: Diagnosis not present

## 2022-05-29 DIAGNOSIS — I10 Essential (primary) hypertension: Secondary | ICD-10-CM

## 2022-05-29 DIAGNOSIS — R1084 Generalized abdominal pain: Secondary | ICD-10-CM

## 2022-05-29 DIAGNOSIS — M6282 Rhabdomyolysis: Secondary | ICD-10-CM | POA: Diagnosis not present

## 2022-05-29 DIAGNOSIS — G061 Intraspinal abscess and granuloma: Secondary | ICD-10-CM | POA: Diagnosis not present

## 2022-05-29 DIAGNOSIS — E43 Unspecified severe protein-calorie malnutrition: Secondary | ICD-10-CM

## 2022-05-29 DIAGNOSIS — M4620 Osteomyelitis of vertebra, site unspecified: Secondary | ICD-10-CM | POA: Diagnosis not present

## 2022-05-29 LAB — CBC
HCT: 27.3 % — ABNORMAL LOW (ref 39.0–52.0)
Hemoglobin: 9.1 g/dL — ABNORMAL LOW (ref 13.0–17.0)
MCH: 27.9 pg (ref 26.0–34.0)
MCHC: 33.3 g/dL (ref 30.0–36.0)
MCV: 83.7 fL (ref 80.0–100.0)
Platelets: 193 10*3/uL (ref 150–400)
RBC: 3.26 MIL/uL — ABNORMAL LOW (ref 4.22–5.81)
RDW: 14.1 % (ref 11.5–15.5)
WBC: 12.2 10*3/uL — ABNORMAL HIGH (ref 4.0–10.5)
nRBC: 0 % (ref 0.0–0.2)

## 2022-05-29 LAB — BASIC METABOLIC PANEL
Anion gap: 12 (ref 5–15)
BUN: 100 mg/dL — ABNORMAL HIGH (ref 8–23)
CO2: 18 mmol/L — ABNORMAL LOW (ref 22–32)
Calcium: 7.2 mg/dL — ABNORMAL LOW (ref 8.9–10.3)
Chloride: 101 mmol/L (ref 98–111)
Creatinine, Ser: 4.93 mg/dL — ABNORMAL HIGH (ref 0.61–1.24)
GFR, Estimated: 12 mL/min — ABNORMAL LOW (ref >60)
Glucose, Bld: 145 mg/dL — ABNORMAL HIGH (ref 70–99)
Potassium: 4.4 mmol/L (ref 3.5–5.1)
Sodium: 131 mmol/L — ABNORMAL LOW (ref 135–145)

## 2022-05-29 LAB — GLUCOSE, CAPILLARY
Glucose-Capillary: 136 mg/dL — ABNORMAL HIGH (ref 70–99)
Glucose-Capillary: 182 mg/dL — ABNORMAL HIGH (ref 70–99)

## 2022-05-29 MED ORDER — GLYCOPYRROLATE 0.2 MG/ML IJ SOLN: 0.2000 mg | INTRAMUSCULAR | Status: DC | PRN

## 2022-05-29 MED ORDER — GLYCOPYRROLATE 1 MG PO TABS: 1.0000 mg | ORAL_TABLET | ORAL | Status: DC | PRN

## 2022-05-29 MED ORDER — ALBUTEROL SULFATE (2.5 MG/3ML) 0.083% IN NEBU: 2.5000 mg | INHALATION_SOLUTION | RESPIRATORY_TRACT | Status: DC | PRN

## 2022-05-29 MED ORDER — ONDANSETRON 4 MG PO TBDP: 4.0000 mg | ORAL_TABLET | Freq: Four times a day (QID) | ORAL | Status: DC | PRN

## 2022-05-29 MED ORDER — SODIUM CHLORIDE 0.9 % IV BOLUS
500.0000 mL | Freq: Once | INTRAVENOUS | Status: AC
  Administered 2022-05-29: 500 mL via INTRAVENOUS

## 2022-05-29 MED ORDER — ACETAMINOPHEN 650 MG RE SUPP: 650.0000 mg | Freq: Four times a day (QID) | RECTAL | Status: DC | PRN

## 2022-05-29 MED ORDER — ONDANSETRON HCL 4 MG/2ML IJ SOLN: 4.0000 mg | Freq: Four times a day (QID) | INTRAMUSCULAR | Status: DC | PRN

## 2022-05-29 MED ORDER — ACETAMINOPHEN 325 MG PO TABS
650.0000 mg | ORAL_TABLET | Freq: Four times a day (QID) | ORAL | Status: DC | PRN
  Administered 2022-05-30: 650 mg via ORAL
  Filled 2022-05-29: qty 2

## 2022-05-29 MED ORDER — SODIUM CHLORIDE 0.9 % IV SOLN: INTRAVENOUS | Status: DC

## 2022-05-29 MED ORDER — ACETAMINOPHEN 500 MG PO TABS
1000.0000 mg | ORAL_TABLET | Freq: Once | ORAL | Status: AC
  Administered 2022-05-29: 1000 mg via ORAL
  Filled 2022-05-29: qty 2

## 2022-05-29 MED ORDER — RISPERIDONE 1 MG PO TABS
1.5000 mg | ORAL_TABLET | Freq: Every day | ORAL | Status: DC
  Administered 2022-05-29 – 2022-05-30 (×2): 1.5 mg via ORAL
  Filled 2022-05-29 (×4): qty 1

## 2022-05-29 MED ORDER — GLYCOPYRROLATE 0.2 MG/ML IJ SOLN
0.2000 mg | INTRAMUSCULAR | Status: DC | PRN
  Filled 2022-05-29: qty 1

## 2022-05-29 MED ORDER — ORAL CARE MOUTH RINSE: 15.0000 mL | OROMUCOSAL | Status: DC | PRN

## 2022-05-29 MED ORDER — LORAZEPAM 2 MG/ML IJ SOLN: 2.0000 mg | INTRAMUSCULAR | Status: DC | PRN

## 2022-05-29 MED ORDER — POLYVINYL ALCOHOL 1.4 % OP SOLN: 1.0000 [drp] | Freq: Four times a day (QID) | OPHTHALMIC | Status: DC | PRN

## 2022-05-29 MED ORDER — ADULT MULTIVITAMIN W/MINERALS CH
1.0000 | ORAL_TABLET | Freq: Every day | ORAL | Status: DC
  Administered 2022-05-29: 1 via ORAL
  Filled 2022-05-29: qty 1

## 2022-05-29 MED ORDER — BOOST / RESOURCE BREEZE PO LIQD CUSTOM
1.0000 | Freq: Three times a day (TID) | ORAL | Status: DC
  Administered 2022-05-29: 1 via ORAL

## 2022-05-29 MED ORDER — MORPHINE SULFATE (PF) 2 MG/ML IV SOLN: 2.0000 mg | INTRAVENOUS | Status: DC | PRN

## 2022-05-29 NOTE — Progress Notes (Signed)
Patient arrived on unit in bed. Awake and alert to self. No acute distress noted. Patient settled into room. Will continue to monitor as per orders.

## 2022-05-29 NOTE — Progress Notes (Signed)
PT Cancellation Note  Patient Details Name: Pedro Mccoy MRN: [id number] DOB: 01-15-1951   Cancelled Treatment:    Reason Eval/Treat Not Completed: Medical issues which prohibited therapy (chart reviewed, noted 8a vitals show resting RW 119bpm, continued hypotension. Will defer treatment to later date/time once more medically optimized for rehab services.)  9:17 AM, 05/29/22 Etta Grandchild, PT, DPT Physical Therapist - [facility]  [phone] ([place])    Skyanne Welle C 05/29/2022, 9:17 AM

## 2022-05-29 NOTE — Plan of Care (Signed)
  Problem: Education: Goal: Knowledge of General Education information will improve Description: Including pain rating scale, medication(s)/side effects and non-pharmacologic comfort measures Outcome: Not Progressing   Problem: Health Behavior/Discharge Planning: Goal: Ability to manage health-related needs will improve Outcome: Not Progressing   

## 2022-05-29 NOTE — Progress Notes (Signed)
       CROSS COVER NOTE  NAME: Pedro Mccoy MRN: [id number] DOB : [birth year] ATTENDING PHYSICIAN: Pedro Reeve, DO    Date of Service   05/29/2022   HPI/Events of Note   Message received from nursing reporting ongoing hypotension with SBP in 70s. Per chart review there are ongoing goals of care conversation with Pedro Mccoy sister, Pedro Mccoy.  0300: BP 84/64 MAP 71. Spoke with sister Pedro Mccoy [phone], discussed overnight hypotension and explained pressors. I explained pressors can provide a short term solution to Pedro Mccoy's hypotension in the setting of sepsis but will not change Pedro Mccoy's overall prognosis. I explained that pressors is in the realm of critical care and Pedro Mccoy would be transferred to ICU if she decided to move forward with pressors. At this time Pedro Mccoy does not want to start pressors if his BP were to remain low. She will be bedside around 0900.  Interventions   Assessment/Plan:  1L Bolus  Remain on 2A     This document was prepared using Dragon voice recognition software and may include unintentional dictation errors.  Neomia Glass DNP, MBA, FNP-BC Nurse Practitioner [facility] Pager [phone]

## 2022-05-29 NOTE — IPAL (Signed)
  Interdisciplinary Goals of Care Family Meeting   Date carried out: 05/29/2022  Location of the meeting: Bedside  Member's involved: Physician and Family Member or next of kin  Durable Power of Attorney or acting medical decision maker: Sister, Pedro Mccoy    Discussion: We discussed goals of care for [employer] .   I spoke yesterday w/ Pedro Mccoy re: pt not improving despite best efforts, and is declining w/ his renal function and his BP. Similar conversation w/ night coverage w/ lowered BP. Pt is unable to meaningfully participate in conversation and he is unable to stay awake but appears in no distress. Pedro Mccoy and I spoke more outside the room. Discussed option to continue aggressive treatment but even if he recovers this acute illness, he still has the cancer to contend with. Option also to transition to comfort measures at this time, I explained this to her. Pedro Mccoy opts for comfort measures here in the hospital, she is unable to care for him at home as she is also caring for another ill family member. She would like to be updated on his condition as needed, she would like to be present with him as much as she is able.    Code status: FULL DNR  Disposition: In-patient comfort care  Time spent for the meeting: 30 mins    Emeterio Reeve, DO  05/29/2022, 4:37 PM

## 2022-05-29 NOTE — Progress Notes (Signed)
Initial Nutrition Assessment  DOCUMENTATION CODES:   Severe malnutrition in context of chronic illness  INTERVENTION:   -Boost Breeze po TID, each supplement provides 250 kcal and 9 grams of protein  -MVI with minerals daily -Liberalize diet to regular  NUTRITION DIAGNOSIS:   Severe Malnutrition related to chronic illness (pancreatic cancer) as evidenced by moderate muscle depletion, severe muscle depletion, severe fat depletion, moderate fat depletion.  GOAL:   Patient will meet greater than or equal to 90% of their needs  MONITOR:   PO intake, Supplement acceptance  REASON FOR ASSESSMENT:   Low Braden    ASSESSMENT:   Pt with past medical history of metastatic pancreatic cancer on chemotherapy, hypertension, diabetes mellitus, schizophrenia and tardive dyskinesia presented with complaints of weakness, fall x2 earlier that day and abdominal pain.  Pt admitted with rhabdomyolysis, hypotension, L2-L3 osteomyelitis with epidural abscess, and lt psoas abscess.   Reviewed I/O's: +2 L x 24 hours and -1.8 L since admission  UOP: 350 ml x 24 hours   Pt with pancreatic cancer with mets to liver; oncology following for chemotherapy. Pt with poor prognosis, however, per pt sister is trying to stay positive. Pt is a poor HD candidate and has declined a feeding tube.   Spoke with pt at bedside, who was drowsy at time of visit. He complains of feeling unwell today. He has a poor appetite and does not feel like eating. Pt has been drinking mostly sips of apple juice. Pt has tried nutritional supplements in the past and does not like them, however, is open to trying [employer]. RD also will liberalize diet to regular for wider variety of meal selections.   Reviewed wt hx; wt has been stable over the past 10 months.   Medications reviewed and include cardizem, lovaza, and miralax.   Labs reviewed: Na: 131, Phos: 8, CBGS: 136-182 (inpatient orders for glycemic control are 0-5 units  insulin aspart daily at bedtime, 0-9 units insulin aspart TID with meals).    NUTRITION - FOCUSED PHYSICAL EXAM:  Flowsheet Row Most Recent Value  Orbital Region Severe depletion  Upper Arm Region Moderate depletion  Thoracic and Lumbar Region Moderate depletion  Buccal Region Severe depletion  Temple Region Severe depletion  Clavicle Bone Region Severe depletion  Clavicle and Acromion Bone Region Severe depletion  Scapular Bone Region Severe depletion  Dorsal Hand Moderate depletion  Patellar Region Moderate depletion  Anterior Thigh Region Moderate depletion  Posterior Calf Region Moderate depletion  Edema (RD Assessment) None  Hair Reviewed  Eyes Reviewed  Mouth Reviewed  Skin Reviewed  Nails Reviewed       Diet Order:   Diet Order             Diet regular Room service appropriate? Yes; Fluid consistency: Thin  Diet effective now                   EDUCATION NEEDS:   Education needs have been addressed  Skin:  Skin Assessment: Skin Integrity Issues: Skin Integrity Issues:: DTI DTI: sacrum  Last BM:  05/27/22  Height:   Ht Readings from Last 1 Encounters:  05/23/22 '6\' 1"'$  (1.854 m)    Weight:   Wt Readings from Last 1 Encounters:  05/23/22 82 kg    Ideal Body Weight:  83.6 kg  BMI:  Body mass index is 23.85 kg/m.  Estimated Nutritional Needs:   Kcal:  2423-5361  Protein:  125-140 grams  Fluid:  > 2 L  Loistine Chance, RD, LDN, [place] Registered Dietitian II Certified Diabetes Care and Education Specialist Please refer to [facility] for RD and/or RD on-call/weekend/after hours pager

## 2022-05-29 NOTE — Progress Notes (Signed)
PROGRESS NOTE    Pedro Mccoy   QVZ:[id number] DOB: 1950/10/15  DOA: 05/24/2022 Date of Service: 05/29/22 PCP: Pedro Bolk, FNP     Brief Narrative / Hospital Course:  71 year old male with past medical history of metastatic pancreatic cancer on chemotherapy, hypertension, diabetes mellitus, schizophrenia and tardive dyskinesia presented to the emergency room on 11/17 with complaints of weakness, fall x2 earlier that day and abdominal pain.  Work-up with septic shock secondary to pyelonephritis, acute versus subacute fracture of L3 vertebral body and rhabdomyolysis with CPK of 22,000.  Patient admitted to the hospitalist service and started on IV fluids and antibiotics.    Following admission, on the evening of 11/17, patient went to rapid atrial flutter.  Attempted treatment with metoprolol and IV fluids.   11/19: tachycardia persisting and patient started on Cardizem gtt. Urine cultures positive for MSSA. 11/20: HR better, remained on cardizem gtt  11/21: off Cardizem gtt, on oral metoprolol and cardizem. ID saw pt for (+)UCx MSSA and cocnern for CT abd/pelvis w/ paraspinal edema, 1 set BCx (+) likely contaminant, advised consider MRI L spine.  11/23: White count continues to rise. No fevers. MRI L spine: L2L3 discitis noted, epidural abscess. Large psoas abscess. 11/24:  to IR, psoas abscess drained 11/25: White count downtrending. Neurosurgery consult - no surgical intervention planned d/t high risk, consider stabilization but may not be appropriate given metastatic disease  11/26: developed AKI Cr 1.15 --> 3.29 and metabolic encephalopathy.  11/28: Cr continues to worsen to 3.16, consulted nephrology 11/29: remains tachycardic in 120s, Cr 4.05, WBC up. Sister and patient agree for DNR status. Deferring dialysis at this time per nephrology. Holding diuresis. Hypotensive pm and overnight 11/30: remains low BP, night coverage spoke w/ sister re: pressors, no pressors. Remains low  today. Cr worse again 4.93. Nephrology s/o, no dialysis.   Overall goals (see palliative notes for details): transfer to nursing facility, treat the treatable, "would not want to live on a machine or feeding tube," DNR as of 11/29. Confirmed on phone w/ sister.   Consultants:  Infectious Discase  Palliative care Interventional Radiology  Neurosurgery Nephrology  Cardiology for TEE  Procedures: 05/23/22: CT guided drainage L psoas abscess w/ IR       ASSESSMENT & PLAN:   Principal Problem:   Rhabdomyolysis Active Problems:   Septic shock ([place])   UTI (urinary tract infection)   Acute pyelonephritis   Atrial flutter (H[facility])   Acute urinary retention   Pancreatic cancer ([place])   HLD (hyperlipidemia)   Lumbar compression fracture, closed, initial encounter ([place])   HTN (hypertension)   Schizophrenia ([place])   Depression with anxiety   Diabetes mellitus without complication ([place])   Abdominal pain   Abnormal LFTs   Fall at home, initial encounter   Lumbar discitis   Vertebral osteomyelitis ([place])   Psoas abscess, left ([place])   Spinal epidural abscess   AKI (acute kidney injury) ([place])   Acute metabolic encephalopathy    Septic shock (H[facility]) Hypotension L2-L3 osteomyelitis with epidural abscess Left psoas abscess S/p CT-guided drainage of left psoas abscess on 05/23/2022 Continue IV cefazolin. No growth on blood cultures.  Urine culture with Staph aureus.   MRI spine with osteo/abscess Fluid culture from psoas abscess showed Staph aureus. DO NOT access port Plan for TEE but this has been put on hold because of patient's current condition Fluid resuscitating as able, sister has declined pressors, will call her today as well to update  AKI - worsening  Creatinine has gotten worse, went from 2.31 --> 3.16 --> 4.05 --> 4.9 today 05/29/22.   Renal ultrasound no evidence of obstructive uropathy.   Continue IV fluids  monitor BMP Nephrology following - no dialysis at this  time - have signed off as of today 63/78/58    Acute metabolic encephalopathy - improved CT head no acute abnormality.  No acidosis or hypercapnia on ABG.   Procalcitonin and lactic acid were unremarkable.   No growth thus far on repeat blood cultures from 05/26/2022.   UA is still abnormal but he has a Foley catheter in place.   Chest x-ray showed left lower lung opacities which may represent atelectasis, pneumonia or perhaps a small left pleural effusion.   Continue IV antibiotics   Rhabdomyolysis Improved   Acute pyelonephritis Urine cultures positive for MSSA.   He is on IV Ancef. ID following    Atrial flutter ([place]), sinus tachycardia Weaned off IV Cardizem drip.   Continue oral metoprolol, Cardizem and Eliquis. 11/29 Metoprolol and cardizem doses were lowered, placed on holding parameters d/t low BP      Acute urinary retention Foley catheter placed on 05/18/2022   Lumbar compression fracture, closed, initial encounter ([place]) Unclear whether this is compression fracture or pathologic in the setting of discitis.   patient is not a surgical candidate per Dr. Izora Ribas, neurosurgeon.   LSO brace ordered for patient comfort.   Pancreatic cancer ([place]) Metastatic mets to the liver.   Oncology following with recent chemotherapy.  Sees onc at [facility].   Next chemo scheduled for 11/30.  Poor functional status. Outpatient follow-up with oncologist and palliative care     Other comorbidities include schizophrenia, hypertension, type II DM   DVT prophylaxis: SCD, Eliquis Pertinent IV fluids/nutrition: none Central lines / invasive devices: Foley cath placed 11/19  Code Status: DNR  Disposition: inpatient  TOC needs: plan d/c to SNF Barriers to discharge / significant pending items: worsening AKI and hypotension              Subjective:  Patient minimally interactive w/ interview/exam, but a bit more alert compared to yesterday.  Denies CP/SOB.  Reports pain in  back.  Denies new weakness.     Family Communication: will reach out to sister later today     Objective Findings:  Vitals:   05/29/22 0100 05/29/22 0300 05/29/22 0811 05/29/22 1257  BP: (!) 79/60 (!) 84/64 (!) 88/60 (!) 71/45  Pulse:   (!) 119 (!) 116  Resp:   18 20  Temp:  98 F (36.7 C) 97.8 F (36.6 C) 97.9 F (36.6 C)  TempSrc:  Axillary    SpO2:  100% 99% 100%  Weight:      Height:        Intake/Output Summary (Last 24 hours) at 05/29/2022 1357 Last data filed at 05/29/2022 0800 Gross per 24 hour  Intake 2504.08 ml  Output 350 ml  Net 2154.08 ml   Filed Weights   05/29/2022 1042 05/23/22 0922  Weight: 82.1 kg 82 kg    Examination:  Constitutional:  VS as above General Appearance: alert when spoken to but sleepy, NAD Respiratory: Low respiratory effort No wheeze No rhonchi No rales Cardiovascular: S1/S2 normal No murmur No rub/gallop auscultated No lower extremity edema Gastrointestinal: No tenderness Musculoskeletal:  No clubbing/cyanosis of digits Neurological: No cranial nerve deficit on limited exam Alert Psychiatric: Fair judgment/insight Flat mood and affect       Scheduled Medications:  apixaban  5 mg Oral BID   Chlorhexidine Gluconate Cloth  6 each Topical Daily   diltiazem  240 mg Oral Daily   feeding supplement  1 Container Oral TID BM   FLUoxetine  40 mg Oral Daily   fluvoxaMINE  100 mg Oral BID   insulin aspart  0-5 Units Subcutaneous QHS   insulin aspart  0-9 Units Subcutaneous TID WC   lamoTRIgine  200 mg Oral BID   lidocaine  1 patch Transdermal Q24H   metoprolol succinate  100 mg Oral Daily   multivitamin with minerals  1 tablet Oral Daily   omega-3 acid ethyl esters  1 g Oral Daily   polyethylene glycol  17 g Oral Daily   risperiDONE  3 mg Oral QHS    Continuous Infusions:   ceFAZolin (ANCEF) IV 2 g (05/29/22 0834)   sodium chloride      PRN Medications:  hydrALAZINE, HYDROmorphone (DILAUDID) injection,  LORazepam, metoprolol tartrate, ondansetron (ZOFRAN) IV, mouth rinse, oxyCODONE, polyvinyl alcohol  Antimicrobials:  Anti-infectives (From admission, onward)    Start     Dose/Rate Route Frequency Ordered Stop   05/27/22 2200  ceFAZolin (ANCEF) IVPB 2g/100 mL premix        2 g 200 mL/hr over 30 Minutes Intravenous Every 12 hours 05/27/22 1503     05/20/22 2200  ceFAZolin (ANCEF) IVPB 2g/100 mL premix  Status:  Discontinued        2 g 200 mL/hr over 30 Minutes Intravenous Every 8 hours 05/20/22 1457 05/27/22 1503   05/18/22 2000  ceFAZolin (ANCEF) IVPB 1 g/50 mL premix  Status:  Discontinued        1 g 100 mL/hr over 30 Minutes Intravenous Every 8 hours 05/18/22 1852 05/20/22 1457   05/10/2022 2000  cefTRIAXone (ROCEPHIN) 1 g in sodium chloride 0.9 % 100 mL IVPB  Status:  Discontinued        1 g 200 mL/hr over 30 Minutes Intravenous Every 24 hours 05/05/2022 1706 05/18/22 1852   05/08/2022 1145  ceFEPIme (MAXIPIME) 2 g in sodium chloride 0.9 % 100 mL IVPB        2 g 200 mL/hr over 30 Minutes Intravenous  Once 05/18/2022 1136 05/27/2022 1246           Data Reviewed: I have personally reviewed following labs and imaging studies  CBC: Recent Labs  Lab 05/24/22 0411 05/26/22 0405 05/27/22 0429 05/23/2022 0558 05/29/22 0737  WBC 18.4* 14.4* 13.8* 15.4* 12.2*  NEUTROABS 14.6* 11.9* 11.4* 13.0*  --   HGB 11.8* 12.2* 11.1* 10.4* 9.1*  HCT 34.6* 36.6* 33.4* 32.0* 27.3*  MCV 84.0 83.2 83.9 82.5 83.7  PLT 224 220 197 214 086   Basic Metabolic Panel: Recent Labs  Lab 05/24/22 0411 05/25/22 1648 05/26/22 0405 05/27/22 0429 05/25/2022 0558 05/29/22 0737  NA 135  --  137 135 133* 131*  K 3.2* 3.7 3.6 4.0 4.3 4.4  CL 102  --  103 104 102 101  CO2 24  --  22 20* 19* 18*  GLUCOSE 167*  --  173* 146* 169* 145*  BUN 34*  --  62* 74* 91* 100*  CREATININE 1.15  --  2.31* 3.16* 4.05* 4.93*  CALCIUM 7.5*  --  7.9* 7.1* 7.5* 7.2*  MG  --   --  2.5*  --   --   --   PHOS  --   --   --   --   8.0*  --  GFR: Estimated Creatinine Clearance: 15.5 mL/min (A) (by C-G formula based on SCr of 4.93 mg/dL (H)). Liver Function Tests: Recent Labs  Lab 05/01/2022 0558  ALBUMIN 1.7*   No results for input(s): "LIPASE", "AMYLASE" in the last 168 hours. No results for input(s): "AMMONIA" in the last 168 hours. Coagulation Profile: No results for input(s): "INR", "PROTIME" in the last 168 hours. Cardiac Enzymes: Recent Labs  Lab 05/27/22 2122  CKTOTAL 107   BNP (last 3 results) No results for input(s): "PROBNP" in the last 8760 hours. HbA1C: No results for input(s): "HGBA1C" in the last 72 hours. CBG: Recent Labs  Lab 05/27/2022 1140 05/05/2022 1604 05/07/2022 1910 05/29/22 0815 05/29/22 1259  GLUCAP 199* 192* 173* 136* 182*   Lipid Profile: No results for input(s): "CHOL", "HDL", "LDLCALC", "TRIG", "CHOLHDL", "LDLDIRECT" in the last 72 hours. Thyroid Function Tests: No results for input(s): "TSH", "T4TOTAL", "FREET4", "T3FREE", "THYROIDAB" in the last 72 hours. Anemia Panel: No results for input(s): "VITAMINB12", "FOLATE", "FERRITIN", "TIBC", "IRON", "RETICCTPCT" in the last 72 hours. Most Recent Urinalysis On File:     Component Value Date/Time   COLORURINE AMBER (A) 05/26/2022 1230   APPEARANCEUR CLOUDY (A) 05/26/2022 1230   LABSPEC 1.017 05/26/2022 1230   PHURINE 5.0 05/26/2022 1230   GLUCOSEU NEGATIVE 05/26/2022 1230   HGBUR LARGE (A) 05/26/2022 1230   BILIRUBINUR NEGATIVE 05/26/2022 1230   KETONESUR NEGATIVE 05/26/2022 1230   PROTEINUR 100 (A) 05/26/2022 1230   NITRITE NEGATIVE 05/26/2022 1230   LEUKOCYTESUR MODERATE (A) 05/26/2022 1230   Sepsis Labs: '@LABRCNTIP'$ (procalcitonin:4,lacticidven:4)  Recent Results (from the past 240 hour(s))  Culture, blood (Routine X 2) w Reflex to ID Panel     Status: None   Collection Time: 05/22/22  6:10 PM   Specimen: BLOOD  Result Value Ref Range Status   Specimen Description BLOOD RIGHT ANTECUBITAL  Final   Special  Requests   Final    BOTTLES DRAWN AEROBIC AND ANAEROBIC Blood Culture adequate volume   Culture   Final    NO GROWTH 5 DAYS Performed at [facility], [address], [place], [place] [address]    Report Status 05/27/2022 FINAL  Final  Culture, blood (Routine X 2) w Reflex to ID Panel     Status: None   Collection Time: 05/22/22  6:10 PM   Specimen: BLOOD  Result Value Ref Range Status   Specimen Description BLOOD BLOOD RIGHT HAND  Final   Special Requests   Final    BOTTLES DRAWN AEROBIC AND ANAEROBIC Blood Culture adequate volume   Culture   Final    NO GROWTH 5 DAYS Performed at [facility], [address], [place], [place] [address]    Report Status 05/27/2022 FINAL  Final  Aerobic/Anaerobic Culture w Gram Stain (surgical/deep wound)     Status: None   Collection Time: 05/23/22 10:09 AM   Specimen: Abscess  Result Value Ref Range Status   Specimen Description   Final    ABSCESS Performed at [facility], [address], [place], [place] [address]    Special Requests   Final    NONE Performed at [facility], [address] [place], [place] [address]    Gram Stain   Final    ABUNDANT WBC PRESENT, PREDOMINANTLY PMN FEW GRAM POSITIVE COCCI IN PAIRS    Culture   Final    FEW STAPHYLOCOCCUS AUREUS NO ANAEROBES ISOLATED Performed at [facility], [place] [address], [place], [place] [address]  Report Status 05/19/2022 FINAL  Final   Organism ID, Bacteria STAPHYLOCOCCUS AUREUS  Final      Susceptibility   Staphylococcus aureus - MIC*    CIPROFLOXACIN <=0.5 SENSITIVE Sensitive     ERYTHROMYCIN <=0.25 SENSITIVE Sensitive     GENTAMICIN <=0.5 SENSITIVE Sensitive     OXACILLIN <=0.25 SENSITIVE Sensitive     TETRACYCLINE <=1 SENSITIVE Sensitive     VANCOMYCIN <=0.5 SENSITIVE Sensitive     TRIMETH/SULFA <=10 SENSITIVE Sensitive     CLINDAMYCIN <=0.25 SENSITIVE Sensitive     RIFAMPIN <=0.5 SENSITIVE Sensitive     Inducible  Clindamycin NEGATIVE Sensitive     * FEW STAPHYLOCOCCUS AUREUS  Culture, blood (Routine X 2) w Reflex to ID Panel     Status: None (Preliminary result)   Collection Time: 05/26/22  9:05 AM   Specimen: BLOOD RIGHT HAND  Result Value Ref Range Status   Specimen Description BLOOD RIGHT HAND  Final   Special Requests   Final    BOTTLES DRAWN AEROBIC AND ANAEROBIC Blood Culture adequate volume   Culture   Final    NO GROWTH 3 DAYS Performed at [facility], [address], [place], [place] [address]    Report Status PENDING  Incomplete  Culture, blood (Routine X 2) w Reflex to ID Panel     Status: None (Preliminary result)   Collection Time: 05/26/22  9:12 AM   Specimen: BLOOD RIGHT FOREARM  Result Value Ref Range Status   Specimen Description BLOOD RIGHT FOREARM  Final   Special Requests   Final    BOTTLES DRAWN AEROBIC AND ANAEROBIC Blood Culture adequate volume   Culture   Final    NO GROWTH 3 DAYS Performed at [facility], [address], [place],  [address]    Report Status PENDING  Incomplete         Radiology Studies: DG Lumbar Spine 2-3 Views  Result Date: 05/02/2022 CLINICAL DATA:  Fall EXAM: LUMBAR SPINE - 2-3 VIEW COMPARISON:  CT 05/24/2022 FINDINGS: There are 5 non-rib-bearing lumbar vertebrae. There is a chronic compression fracture of L1 and and acute-subacute compression fracture of L3. There is multilevel degenerative disc disease, worst at L4-L5 and L5-S1 with moderate and severe disc height loss respectively. There is moderate lower lumbar predominant facet arthropathy. Partially visualized right hip arthroplasty. IMPRESSION: Acute-subacute compression fracture of L3 with up to 50% height loss. Chronic compression deformity of L1 without evidence of progressive height loss. Multilevel degenerative disc disease, moderate at L4-L5 and severe at L5-S1. Moderate lower lumbar predominant facet arthropathy. Electronically Signed   By: Maurine Simmering  M.D.   On: 05/25/2022 17:09   CT ABDOMEN PELVIS W CONTRAST  Result Date: 05/29/2022 CLINICAL DATA:  Pancreatic cancer. Blunt abdominal trauma. Patient found down on floor incontinent to urine. Recent diagnosis of sepsis. * Tracking Code: BO * EXAM: CT ABDOMEN AND PELVIS WITH CONTRAST TECHNIQUE: Multidetector CT imaging of the abdomen and pelvis was performed using the standard protocol following bolus administration of intravenous contrast. RADIATION DOSE REDUCTION: This exam was performed according to the departmental dose-optimization program which includes automated exposure control, adjustment of the mA and/or kV according to patient size and/or use of iterative reconstruction technique. CONTRAST:  155m OMNIPAQUE IOHEXOL 300 MG/ML  SOLN COMPARISON:  Radiographs from 05/25/2022 and CT scan 05/20/2021 FINDINGS: Lower chest: Mild dependent atelectasis or scarring. Hepatobiliary: 4 new hypodense lesions in the right hepatic lobe are identified, probably metastatic lesions. The largest measures 1.9  by 1.6 cm in segment 7 of the liver (image 14, series 2). Gallbladder unremarkable. No biliary dilatation. Pancreas: Substantial enlargement of the hypoenhancing infiltrative pancreatic mass, currently 6.6 by 4.2 cm on image 25 of series 2 and previously about 2.7 by 2.0 cm on 05/20/2021. This mass abuts portions of the splenic artery, the junction of the SMV and the portal vein, and nearly occludes the splenic vein as shown for example on image 49 series 5. Spleen: Trace perisplenic ascites, otherwise unremarkable. Adrenals/Urinary Tract: Both adrenal glands appear normal. Stable small renal cysts warrant no further follow up. Subtle parenchymal hypoenhancement posteriorly in the right kidney upper pole on image 32 series 2, correlate with urine analysis in assessing for possible pyelonephritis. Distended urinary bladder. Stomach/Bowel: Unremarkable Vascular/Lymphatic: Vascular involvement of the enlarging  pancreatic mass as noted above. Atherosclerosis is present, including aortoiliac atherosclerotic disease. Reproductive: Prostatomegaly. Other: No supplemental non-categorized findings. Musculoskeletal: Right total hip prosthesis noted. Acute or early subacute fracture of the L3 vertebral body with involvement of the superior endplate and extension to the middle column, with 1-2 mm posterior bony retropulsion. We demonstrate nearly 50% loss of vertebral body height and some mild anterior fragmentation of the vertebral rim along with paraspinal edema/mild paraspinal hematoma. There is also a chronic compression fracture at the L4 vertebral level. Multilevel lumbar spondylosis and degenerative disc disease leading to multilevel impingement. Small umbilical hernia containing adipose tissue. Somewhat prominent piriformis and obturator internus musculature in the pelvis, left greater than right, cannot exclude mild sciatic notch impingement due to the left piriformis hypertrophy. IMPRESSION: 1. Substantial enlargement of the hypoenhancing infiltrative pancreatic mass, currently 6.6 by 4.2 cm, previously about 2.7 by 2.0 cm on 05/20/2021. This mass abuts portions of the splenic artery, the junction of the SMV and portal vein, and nearly occludes the splenic vein. 2. 4 new hypodense lesions in the right hepatic lobe are probably metastatic lesions. 3. Acute or early subacute fracture of the L3 vertebral body with involvement of the superior endplate and extension to the middle column, with nearly 50% loss of vertebral body height and some mild anterior fragmentation of the vertebral rim. 1-2 mm posterior bony retropulsion. Paraspinal edema noted. 4. Subtle parenchymal hypoenhancement posteriorly in the right kidney upper pole, correlate with urine analysis in assessing for possible pyelonephritis. 5. Distended urinary bladder. 6. Prostatomegaly. 7. Somewhat prominent piriformis and obturator internus musculature in the  pelvis, left greater than right, cannot exclude left sciatic notch impingement due to the left piriformis hypertrophy. 8. Multilevel lumbar spondylosis and degenerative disc disease leading to multilevel impingement. 9. Small umbilical hernia containing adipose tissue. 10. Aortic atherosclerosis. Aortic Atherosclerosis (ICD10-I70.0). Electronically Signed   By: Van Clines M.D.   On: 05/10/2022 16:41   DG Hip Unilat W or Wo Pelvis 2-3 Views Left  Result Date: 05/11/2022 CLINICAL DATA:  Fall.  Evaluate for fracture. EXAM: DG HIP (WITH OR WITHOUT PELVIS) 2-3V LEFT COMPARISON:  Right hip radiographs 05/21/2021, pelvis and right hip radiographs 05/20/2021 FINDINGS: Status post total right hip arthroplasty. There is diffuse decreased bone mineralization. No perihardware lucency is seen to indicate radiographic evidence of hardware failure. Mild superolateral left acetabular degenerative osteophytosis. The bilateral sacroiliac, left femoroacetabular, and pubic symphysis joint spaces are maintained. Small sclerotic focus within the left ilium is unchanged from 05/20/2021, compatible with a benign bone island. No acute fracture is seen.  No dislocation. Moderate to severe right L4-5 disc space narrowing and right lateral endplate osteophytes. IMPRESSION: Status post total right  hip arthroplasty without evidence of hardware failure. No acute fracture is seen. Electronically Signed   By: Yvonne Kendall M.D.   On: 05/29/2022 11:32   DG Chest Port 1 View  Result Date: 04/30/2022 CLINICAL DATA:  Sepsis EXAM: PORTABLE CHEST 1 VIEW COMPARISON:  05/20/2021 FINDINGS: Right IJ approach chest port in place with distal tip at the level of the distal SVC. The heart size and mediastinal contours are within normal limits. Aortic atherosclerosis. Both lungs are clear. The visualized skeletal structures are unremarkable. IMPRESSION: No active disease. Electronically Signed   By: Davina Poke D.O.   On: 05/01/2022 11:30    DG Shoulder Left  Result Date: 05/12/2022 CLINICAL DATA:  Fall.  Evaluate for fracture. EXAM: LEFT SHOULDER - 2+ VIEW COMPARISON:  None Available. FINDINGS: Severe acromioclavicular joint space narrowing with mild-to-moderate peripheral degenerative osteophytes. Mild glenohumeral joint space narrowing. Mild peripheral glenoid degenerative osteophytosis. No acute fracture is seen. No dislocation. The visualized portion of the left lung is unremarkable. IMPRESSION: 1. No acute fracture. 2. Moderate acromioclavicular and mild glenohumeral osteoarthritis. Electronically Signed   By: Yvonne Kendall M.D.   On: 05/15/2022 11:29   CT HEAD WO CONTRAST (5MM)  Result Date: 05/07/2022 CLINICAL DATA:  Head trauma, minor (Age >= 65y); Neck trauma (Age >= 65y) EXAM: CT HEAD WITHOUT CONTRAST CT CERVICAL SPINE WITHOUT CONTRAST TECHNIQUE: Multidetector CT imaging of the head and cervical spine was performed following the standard protocol without intravenous contrast. Multiplanar CT image reconstructions of the cervical spine were also generated. RADIATION DOSE REDUCTION: This exam was performed according to the departmental dose-optimization program which includes automated exposure control, adjustment of the mA and/or kV according to patient size and/or use of iterative reconstruction technique. COMPARISON:  None Available. FINDINGS: CT HEAD FINDINGS Brain: No evidence of acute infarction, hemorrhage, hydrocephalus, extra-axial collection or mass lesion/mass effect. Vascular: No hyperdense vessel identified. Skull: No acute fracture.  Left forehead contusion. Sinuses/Orbits: Paranasal sinus mucosal thickening. Other: No mastoid effusions. CT CERVICAL SPINE FINDINGS Alignment: Straightening.  No substantial sagittal subluxation. Skull base and vertebrae: Vertebral heights are maintained. No evidence of acute fracture. Soft tissues and spinal canal: No prevertebral fluid or swelling. No visible canal hematoma. Disc levels:  Multilevel facet and uncovertebral hypertrophy there degrees neural foraminal stenosis. Multilevel degenerative disease. Upper chest: Visualized lung apices are clear. IMPRESSION: 1. No evidence of acute intracranial abnormality. 2. No evidence of acute fracture or traumatic malalignment in the cervical spine. Electronically Signed   By: Margaretha Sheffield M.D.   On: 05/11/2022 11:23   CT Cervical Spine Wo Contrast  Result Date: 05/06/2022 CLINICAL DATA:  Head trauma, minor (Age >= 65y); Neck trauma (Age >= 65y) EXAM: CT HEAD WITHOUT CONTRAST CT CERVICAL SPINE WITHOUT CONTRAST TECHNIQUE: Multidetector CT imaging of the head and cervical spine was performed following the standard protocol without intravenous contrast. Multiplanar CT image reconstructions of the cervical spine were also generated. RADIATION DOSE REDUCTION: This exam was performed according to the departmental dose-optimization program which includes automated exposure control, adjustment of the mA and/or kV according to patient size and/or use of iterative reconstruction technique. COMPARISON:  None Available. FINDINGS: CT HEAD FINDINGS Brain: No evidence of acute infarction, hemorrhage, hydrocephalus, extra-axial collection or mass lesion/mass effect. Vascular: No hyperdense vessel identified. Skull: No acute fracture.  Left forehead contusion. Sinuses/Orbits: Paranasal sinus mucosal thickening. Other: No mastoid effusions. CT CERVICAL SPINE FINDINGS Alignment: Straightening.  No substantial sagittal subluxation. Skull base and vertebrae: Vertebral heights are maintained. No  evidence of acute fracture. Soft tissues and spinal canal: No prevertebral fluid or swelling. No visible canal hematoma. Disc levels: Multilevel facet and uncovertebral hypertrophy there degrees neural foraminal stenosis. Multilevel degenerative disease. Upper chest: Visualized lung apices are clear. IMPRESSION: 1. No evidence of acute intracranial abnormality. 2. No evidence  of acute fracture or traumatic malalignment in the cervical spine. Electronically Signed   By: Margaretha Sheffield M.D.   On: 05/20/2022 11:23            LOS: 13 days    Time spent: 50 minutes     Emeterio Reeve, DO [facility] 05/29/2022, 1:57 PM    Dictation software may have been used to generate the above note. Typos may occur and escape review in typed/dictated notes. Please contact Dr Sheppard Coil directly for clarity if needed.  Staff may message me via secure chat in [place]  but this may not receive an immediate response,  please page me for urgent matters!  If 7PM-7AM, please contact night coverage [email or web address]

## 2022-05-29 NOTE — Progress Notes (Signed)
  [facility]  ROUNDING NOTE   Subjective:   Creatinine 4.93 UOP 346m overnight  Assessment/ Plan:  Pedro Mccoy a 71y.o.  male  with past medical conditions including hyperlipidemia, hypertension, diabetes, depression and anxiety, schizophrenia, tardive dyskinesia, and metastatic pancreatic cancer on chemo, who was admitted to [facility]20[facility] for Rhabdomyolysis [M62.82] Traumatic rhabdomyolysis, initial encounter ([facility] [T79.6XXA]   Acute kidney injury with hematuria/proteinuria appears to be multifactorial secondary to sepsis and hemodynamic instability resulting in ATN. Renal ultrasound negative for obstruction. History of hematuria however RBCs present with this admission. Patient currently prescribed Eliquis. Appreciate palliative care assisting with goal of care. Will not pursue dialysis at this time.   Lab Results  Component Value Date   CREATININE 4.93 (H) 05/29/2022   CREATININE 4.05 (H) 05/05/2022   CREATININE 3.16 (H) 05/27/2022    Intake/Output Summary (Last 24 hours) at 05/29/2022 1345 Last data filed at 05/29/2022 0800 Gross per 24 hour  Intake 2504.08 ml  Output 350 ml  Net 2154.08 ml    2.  Acute pyelonephritis with urinary retention.  Urine cultures positive for MSSA and patient currently treated with IV Ancef.  Foley catheter placed.   3.  Rhabdomyolysis, CK peak 22,000 and is now 847.  Foley catheter.   We will sign off at this time, feel free to contact [place] questions or concerns.    LOS: 1Mellette11/30/20231:45 PM

## 2022-05-29 NOTE — Progress Notes (Addendum)
Palliative Care Progress Note, Assessment & Plan   Patient Name: Pedro Mccoy       Date: 05/29/2022 DOB: 02/09/1951  Age: 71 y.o. MRN#: [id number] Attending Physician: Emeterio Reeve, DO Primary Care Physician: Romualdo Bolk, FNP Admit Date: 05/12/2022  Reason for Consultation/Follow-up: Establishing goals of care  Subjective: Patient is lying in bed, ill-appearing, and frail.  He acknowledges my presence and is able to make his wishes known.  No family present at bedside.  Patient complains of continued pain in low back but has no new acute complaints at this time.  HPI: 71 y.o. male  with past medical history of prostate cancer with known metastasis to liver, HTN, type 2 diabetes, schizophrenia (visual hallucinations), tardive dyskinesia, and right total hip replacement admitted on 05/18/2022 with weakness and falls (X2) at home.   Patient is being treated for septic shock secondary to pyelonephritis as well as L3 vertebral fracture, rhabdomyolysis, and urine cultures positive for MSSA.   1/19, patient became tachycardic, failed to respond to metoprolol and IVF, and was placed on Cardizem gtt. He has since been converted to oral cardizem.    11/23, WBC levels continue to rise. MRI of spine revealed L2/L3 discitis/osteomyelitis with associated epidural large left psoas abscess. Pt was placed in NPO status, Eliquis held, and IR consulted for consideration of percutaneous drain.   On 11/24, pt had CT with aspiration of left psoas abscess without complication. Pt receiving IV abx.    Given trending up of creatinine, nephrology has been consulted.Given rapid HR in AM of 11/27, cardiology has been consulted. TEE to be completed.   11/29 PM, patient had hypotension and tachycardia. Sister was phoned  and declined use of pressors.   Summary of counseling/coordination of care: After reviewing the patient's chart and assessing the patient at bedside, I spoke with patient in regards to diagnosis, prognosis, and goals of care.  I attempted to gauge patient's current understanding of his medical situation.  He shares he knows he still has the infection in his back.  He quickly changes the subject.  I attempted to elicit goals and values important to the patient.  He shares he will do "whatever needs to happen". I attempted to clarify what he means by this statement but patient decline to provide further insight.  I discussed that we are remaining positive and hopeful.  However, we are also realistic and understanding of his poor prognosis. I reviewed that his kidneys have acute kidney injury and he is not a candidate for HD, his blood pressures have dropped, and his heart rate has increased.  Patient says he understands all of this and is "okay with it".  I asked if patient wants to continue with current treatment plan he said yes.  I asked if this is an acceptable quality of life for him currently and he said yes.   Patient said he had a few sips of apple juice and a few bites of applesauce this morning.  He says he "feels okay now". Patient in agreement  to continue with current treatment plan despite our discussion of the gravity of his current medical situation and poor prognosis.   PMT will continue to follow  the patient and support he and his family throughout this hospitalization.  DNR remains.  Therapeutic silence and active listening provided for patient to share his thoughts and emotions regarding current medical situation.  Emotional support provided. He requested that I change the channel on the TV and that I let him sleep.   I spoke with patient's sister [name] over the phone. She shared she is on the way to hospital and will receive updates when she arrives.   Counseled with Dr.  Sheppard Coil who plans to speak with [facility] bedside this afternoon. PMT remains available to patient/family.   Physical Exam Vitals reviewed.  Constitutional:      General: He is not in acute distress.    Appearance: He is ill-appearing.  HENT:     Head: Normocephalic.     Mouth/Throat:     Mouth: Mucous membranes are moist.  Eyes:     Pupils: Pupils are equal, round, and reactive to light.  Cardiovascular:     Rate and Rhythm: Tachycardia present.     Pulses: Normal pulses.  Pulmonary:     Effort: Pulmonary effort is normal.  Abdominal:     Palpations: Abdomen is soft.  Musculoskeletal:     Comments: Generalized weakness  Skin:    General: Skin is warm and dry.  Neurological:     Mental Status: He is alert and oriented to person, place, and time.  Psychiatric:        Mood and Affect: Mood normal.        Behavior: Behavior normal.        Thought Content: Thought content normal.        Judgment: Judgment normal.             Palliative Assessment/Data: 30%    Total Time 25 minutes  Greater than 50%  of this time was spent counseling and coordinating care related to the above assessment and plan.  Thank you for allowing the Palliative Medicine Team to assist in the care of this patient.  [place] Ilsa Iha, FNP-BC Palliative Medicine Team Team Phone # [phone]

## 2022-05-30 DIAGNOSIS — K6812 Psoas muscle abscess: Secondary | ICD-10-CM | POA: Diagnosis not present

## 2022-05-30 DIAGNOSIS — Z515 Encounter for palliative care: Secondary | ICD-10-CM | POA: Diagnosis not present

## 2022-05-30 DIAGNOSIS — M4620 Osteomyelitis of vertebra, site unspecified: Secondary | ICD-10-CM | POA: Diagnosis not present

## 2022-05-30 DIAGNOSIS — C259 Malignant neoplasm of pancreas, unspecified: Secondary | ICD-10-CM | POA: Diagnosis not present

## 2022-05-30 DIAGNOSIS — M6282 Rhabdomyolysis: Secondary | ICD-10-CM | POA: Diagnosis not present

## 2022-05-30 MED ORDER — BIOTENE DRY MOUTH MT LIQD: 15.0000 mL | OROMUCOSAL | Status: DC | PRN

## 2022-05-30 MED ORDER — HYDROMORPHONE HCL 1 MG/ML IJ SOLN: 0.5000 mg | INTRAMUSCULAR | Status: DC | PRN

## 2022-05-30 NOTE — Progress Notes (Signed)
  PT is now comfort care Will sign off

## 2022-05-30 NOTE — Progress Notes (Signed)
PT Cancellation Note  Patient Details Name: Pedro Mccoy MRN: [id number] DOB: [birth year]   Cancelled Treatment:    Reason Eval/Treat Not Completed: Other (comment).  Chart reviewed.  Pt now comfort care.  Will sign off at this time  Leitha Bleak, PT 05/30/22, 7:52 PM

## 2022-05-30 NOTE — Care Management Important Message (Signed)
Important Message  Patient Details  Name: Pedro Mccoy MRN: [id number] Date of Birth: 07/19/1950   Medicare Important Message Given:  Other (see comment)  On comfort care measures.  Medicare IM withheld at this time out of respect for patient and family.    [name] 05/30/2022, 10:34 AM

## 2022-05-30 NOTE — Progress Notes (Signed)
Palliative Care Progress Note, Assessment & Plan   Patient Name: Pedro Mccoy       Date: 05/30/2022 DOB: 12/03/1950  Age: 71 y.o. MRN#: [id number] Attending Physician: Emeterio Reeve, DO Primary Care Physician: Romualdo Bolk, FNP Admit Date: 05/19/2022  Reason for Consultation/Follow-up: Establishing goals of care  Subjective: Patient is sitting up in bed in NAD.  Nurses at bedside.  Patient acknowledges my presence.  He is able to answer questions appropriately but has a very weak and soft voice.  No family/friends present at bedside.  Patient endorses he is not in any new or acute pain but continues to have the discomfort in his low back.  Respirations are even and unlabored.  Patient is warm to touch but denies feeling feverish.  HPI: 71 y.o. male  with past medical history of prostate cancer with known metastasis to liver, HTN, type 2 diabetes, schizophrenia (visual hallucinations), tardive dyskinesia, and right total hip replacement admitted on 05/11/2022 with weakness and falls (X2) at home.   Patient is being treated for septic shock secondary to pyelonephritis as well as L3 vertebral fracture, rhabdomyolysis, and urine cultures positive for MSSA.   1/19, patient became tachycardic, failed to respond to metoprolol and IVF, and was placed on Cardizem gtt. He has since been converted to oral cardizem.    11/23, WBC levels continue to rise. MRI of spine revealed L2/L3 discitis/osteomyelitis with associated epidural large left psoas abscess. Pt was placed in NPO status, Eliquis held, and IR consulted for consideration of percutaneous drain.   On 11/24, pt had CT with aspiration of left psoas abscess without complication. Pt receiving IV abx.    Given trending up of creatinine, nephrology has  been consulted.Given rapid HR in AM of 11/27, cardiology has been consulted. TEE to be completed.    11/29 PM, patient had hypotension and tachycardia. Sister was phoned and declined use of pressors.   11/30, meeting with attending, patient, and sister - decision was made to shift to comfort measures only.   Summary of counseling/coordination of care: After reviewing the patient's chart and assessing the patient at bedside, I discussed symptom and medication management with patient and nurse.  Patient is still able to take p.o. medications.  Recommend a dose of Tylenol now to help with patient's discomfort.  Discussed synergistic effect of Tylenol with other medications for pain.  I counseled with Dr. Sheppard Coil and advised that medications for pain be switched from morphine to Dilaudid given patient's AKI.  Adjustments made to reflect dilaudid IV PRN as well as modification of other orders to reflect full comfort measures.  PMT will continue to monitor the patient throughout his hospitalization.  Of note, there is no in person PMT provider over the weekend.  DNR remains. Comfort measures only.   Physical Exam Constitutional:      General: He is not in acute distress.    Appearance: He is ill-appearing.  HENT:     Head: Normocephalic.     Nose: Nose normal.     Mouth/Throat:     Mouth: Mucous membranes are dry.  Eyes:     Pupils: Pupils are equal, round, and reactive to light.  Cardiovascular:  Rate and Rhythm: Tachycardia present.     Pulses: Normal pulses.  Pulmonary:     Effort: Pulmonary effort is normal.  Musculoskeletal:     Comments: Generalized weakness  Neurological:     Mental Status: He is alert and oriented to person, place, and time.  Psychiatric:        Mood and Affect: Mood normal.        Behavior: Behavior normal.        Thought Content: Thought content normal.        Judgment: Judgment normal.             Palliative Assessment/Data: 20%    Total Time  50 minutes  Greater than 50%  of this time was spent counseling and coordinating care related to the above assessment and plan.  Thank you for allowing the Palliative Medicine Team to assist in the care of this patient.  [place] Ilsa Iha, FNP-BC Palliative Medicine Team Team Phone # [phone]

## 2022-05-30 NOTE — Progress Notes (Signed)
PROGRESS NOTE    Pedro Mccoy   OEH:[id number] DOB: 09/18/1950  DOA: 05/04/2022 Date of Service: 05/30/22 PCP: Romualdo Bolk, FNP     Brief Narrative / Hospital Course:  71 year old male with past medical history of metastatic pancreatic cancer on chemotherapy, hypertension, diabetes mellitus, schizophrenia and tardive dyskinesia presented to the emergency room on 11/17 with complaints of weakness, fall x2 earlier that day and abdominal pain.  Work-up with septic shock secondary to pyelonephritis, acute versus subacute fracture of L3 vertebral body and rhabdomyolysis with CPK of 22,000.  Patient admitted to the hospitalist service and started on IV fluids and antibiotics.    Following admission, on the evening of 11/17, patient went to rapid atrial flutter.  Attempted treatment with metoprolol and IV fluids.   11/19: tachycardia persisting and patient started on Cardizem gtt. Urine cultures positive for MSSA. 11/20: HR better, remained on cardizem gtt  11/21: off Cardizem gtt, on oral metoprolol and cardizem. ID saw pt for (+)UCx MSSA and cocnern for CT abd/pelvis w/ paraspinal edema, 1 set BCx (+) likely contaminant, advised consider MRI L spine.  11/23: White count continues to rise. No fevers. MRI L spine: L2L3 discitis noted, epidural abscess. Large psoas abscess. 11/24:  to IR, psoas abscess drained 11/25: White count downtrending. Neurosurgery consult - no surgical intervention planned d/t high risk, consider stabilization but may not be appropriate given metastatic disease  11/26: developed AKI Cr 1.15 --> 0.37 and metabolic encephalopathy.  11/28: Cr continues to worsen to 3.16, consulted nephrology 11/29: remains tachycardic in 120s, Cr 4.05, WBC up. Sister and patient agree for DNR status. Deferring dialysis at this time per nephrology. Holding diuresis. Hypotensive pm and overnight 11/30: remains low BP, night coverage spoke w/ sister re: pressors, no pressors. Remains low  today. Cr worse again 4.93. Nephrology s/o, no dialysis. Spoke in person w/ sister later I the day, see IPAL note. Discussion of options - decision was made for comfort measures only   Overall goals (see palliative notes for details): transfer to nursing facility, treat the treatable, "would not want to live on a machine or feeding tube," DNR as of 11/29. Confirmed on phone w/ sister.   Consultants:  Infectious Discase  Palliative care Interventional Radiology  Neurosurgery Nephrology  Cardiology for TEE  Procedures: 05/23/22: CT guided drainage L psoas abscess w/ IR       ASSESSMENT & PLAN:   Principal Problem:   Comfort measures only status Active Problems:   Septic shock ([place])   UTI (urinary tract infection)   Acute pyelonephritis   Rhabdomyolysis   Atrial flutter ([facility])   Acute urinary retention   Pancreatic cancer ([place])   HLD (hyperlipidemia)   Lumbar compression fracture, closed, initial encounter ([place])   HTN (hypertension)   Schizophrenia ([place])   Depression with anxiety   Diabetes mellitus without complication ([place])   Abdominal pain   Abnormal LFTs   Fall at home, initial encounter   Lumbar discitis   Vertebral osteomyelitis ([place])   Psoas abscess, left (H[facility])   Spinal epidural abscess   AKI (acute kidney injury) ([place])   Acute metabolic encephalopathy   Protein-calorie malnutrition, severe   Comfort measures only  Pain and anxiety control  Frequent assessments for pain/anxiety or other discomfort Call sister if he is deteriorating - she would like to be present if needed / as she is able  Subjective:  Patient has no complaints, no concerns from RN   Family Communication: none at this time    Objective Findings:  Vitals:   05/29/22 0811 05/29/22 1257 05/29/22 2105 05/30/22 0808  BP: (!) 88/60 (!) '71/45 97/68 95/61 '$  Pulse: (!) 119 (!) 116 (!) 117 (!) 123  Resp: '18 20 20 16  '$ Temp: 97.8 F (36.6 C) 97.9 F (36.6  C) 97.6 F (36.4 C) 98.4 F (36.9 C)  TempSrc:   Oral Oral  SpO2: 99% 100% 97% 94%  Weight:   82.5 kg   Height:   '6\' 1"'$  (1.854 m)     Intake/Output Summary (Last 24 hours) at 05/30/2022 1334 Last data filed at 05/30/2022 0426 Gross per 24 hour  Intake 621.58 ml  Output 225 ml  Net 396.58 ml   Filed Weights   05/07/2022 1042 05/23/22 0922 05/29/22 2105  Weight: 82.1 kg 82 kg 82.5 kg    Examination:  Constitutional:  General Appearance: NAD Respiratory: Normal respiratory effort       Scheduled Medications:   feeding supplement  1 Container Oral TID BM   FLUoxetine  40 mg Oral Daily   lamoTRIgine  200 mg Oral BID   lidocaine  1 patch Transdermal Q24H   polyethylene glycol  17 g Oral Daily   risperiDONE  1.5 mg Oral QHS    Continuous Infusions:  sodium chloride 20 mL/hr at 05/29/22 1658    PRN Medications:  acetaminophen **OR** acetaminophen, albuterol, antiseptic oral rinse, glycopyrrolate **OR** glycopyrrolate **OR** glycopyrrolate, hydrALAZINE, HYDROmorphone (DILAUDID) injection, LORazepam, LORazepam, ondansetron **OR** ondansetron (ZOFRAN) IV, mouth rinse, oxyCODONE, polyvinyl alcohol  Antimicrobials:  Anti-infectives (From admission, onward)    Start     Dose/Rate Route Frequency Ordered Stop   05/27/22 2200  ceFAZolin (ANCEF) IVPB 2g/100 mL premix  Status:  Discontinued        2 g 200 mL/hr over 30 Minutes Intravenous Every 12 hours 05/27/22 1503 05/29/22 1540   05/20/22 2200  ceFAZolin (ANCEF) IVPB 2g/100 mL premix  Status:  Discontinued        2 g 200 mL/hr over 30 Minutes Intravenous Every 8 hours 05/20/22 1457 05/27/22 1503   05/18/22 2000  ceFAZolin (ANCEF) IVPB 1 g/50 mL premix  Status:  Discontinued        1 g 100 mL/hr over 30 Minutes Intravenous Every 8 hours 05/18/22 1852 05/20/22 1457   05/22/2022 2000  cefTRIAXone (ROCEPHIN) 1 g in sodium chloride 0.9 % 100 mL IVPB  Status:  Discontinued        1 g 200 mL/hr over 30 Minutes Intravenous Every 24  hours 05/07/2022 1706 05/18/22 1852   05/20/2022 1145  ceFEPIme (MAXIPIME) 2 g in sodium chloride 0.9 % 100 mL IVPB        2 g 200 mL/hr over 30 Minutes Intravenous  Once 05/27/2022 1136 05/05/2022 1246           Data Reviewed: I have personally reviewed following labs and imaging studies  CBC: Recent Labs  Lab 05/24/22 0411 05/26/22 0405 05/27/22 0429 05/07/2022 0558 05/29/22 0737  WBC 18.4* 14.4* 13.8* 15.4* 12.2*  NEUTROABS 14.6* 11.9* 11.4* 13.0*  --   HGB 11.8* 12.2* 11.1* 10.4* 9.1*  HCT 34.6* 36.6* 33.4* 32.0* 27.3*  MCV 84.0 83.2 83.9 82.5 83.7  PLT 224 220 197 214 825   Basic Metabolic Panel: Recent Labs  Lab 05/24/22 0411 05/25/22 1648 05/26/22 0405 05/27/22 0429 05/01/2022 0558 05/29/22 0737  NA 135  --  137 135 133* 131*  K 3.2* 3.7 3.6 4.0 4.3 4.4  CL 102  --  103 104 102 101  CO2 24  --  22 20* 19* 18*  GLUCOSE 167*  --  173* 146* 169* 145*  BUN 34*  --  62* 74* 91* 100*  CREATININE 1.15  --  2.31* 3.16* 4.05* 4.93*  CALCIUM 7.5*  --  7.9* 7.1* 7.5* 7.2*  MG  --   --  2.5*  --   --   --   PHOS  --   --   --   --  8.0*  --    GFR: Estimated Creatinine Clearance: 15.5 mL/min (A) (by C-G formula based on SCr of 4.93 mg/dL (H)). Liver Function Tests: Recent Labs  Lab 05/20/2022 0558  ALBUMIN 1.7*   No results for input(s): "LIPASE", "AMYLASE" in the last 168 hours. No results for input(s): "AMMONIA" in the last 168 hours. Coagulation Profile: No results for input(s): "INR", "PROTIME" in the last 168 hours. Cardiac Enzymes: Recent Labs  Lab 05/27/22 2122  CKTOTAL 107   BNP (last 3 results) No results for input(s): "PROBNP" in the last 8760 hours. HbA1C: No results for input(s): "HGBA1C" in the last 72 hours. CBG: Recent Labs  Lab 05/03/2022 1140 05/18/2022 1604 05/08/2022 1910 05/29/22 0815 05/29/22 1259  GLUCAP 199* 192* 173* 136* 182*   Lipid Profile: No results for input(s): "CHOL", "HDL", "LDLCALC", "TRIG", "CHOLHDL", "LDLDIRECT" in the last  72 hours. Thyroid Function Tests: No results for input(s): "TSH", "T4TOTAL", "FREET4", "T3FREE", "THYROIDAB" in the last 72 hours. Anemia Panel: No results for input(s): "VITAMINB12", "FOLATE", "FERRITIN", "TIBC", "IRON", "RETICCTPCT" in the last 72 hours. Most Recent Urinalysis On File:     Component Value Date/Time   COLORURINE AMBER (A) 05/26/2022 1230   APPEARANCEUR CLOUDY (A) 05/26/2022 1230   LABSPEC 1.017 05/26/2022 1230   PHURINE 5.0 05/26/2022 1230   GLUCOSEU NEGATIVE 05/26/2022 1230   HGBUR LARGE (A) 05/26/2022 1230   BILIRUBINUR NEGATIVE 05/26/2022 1230   KETONESUR NEGATIVE 05/26/2022 1230   PROTEINUR 100 (A) 05/26/2022 1230   NITRITE NEGATIVE 05/26/2022 1230   LEUKOCYTESUR MODERATE (A) 05/26/2022 1230   Sepsis Labs: '@LABRCNTIP'$ (procalcitonin:4,lacticidven:4)  Recent Results (from the past 240 hour(s))  Culture, blood (Routine X 2) w Reflex to ID Panel     Status: None   Collection Time: 05/22/22  6:10 PM   Specimen: BLOOD  Result Value Ref Range Status   Specimen Description BLOOD RIGHT ANTECUBITAL  Final   Special Requests   Final    BOTTLES DRAWN AEROBIC AND ANAEROBIC Blood Culture adequate volume   Culture   Final    NO GROWTH 5 DAYS Performed at [facility], [address] [place], [place] [address]    Report Status 05/27/2022 FINAL  Final  Culture, blood (Routine X 2) w Reflex to ID Panel     Status: None   Collection Time: 05/22/22  6:10 PM   Specimen: BLOOD  Result Value Ref Range Status   Specimen Description BLOOD BLOOD RIGHT HAND  Final   Special Requests   Final    BOTTLES DRAWN AEROBIC AND ANAEROBIC Blood Culture adequate volume   Culture   Final    NO GROWTH 5 DAYS Performed at [facility], [address], [place], [place] [address]    Report Status 05/27/2022 FINAL  Final  Aerobic/Anaerobic Culture w Gram Stain (surgical/deep wound)     Status: None   Collection Time: 05/23/22 10:09  AM   Specimen: Abscess  Result Value  Ref Range Status   Specimen Description   Final    ABSCESS Performed at [facility], [address] [place], [place] [address]    Special Requests   Final    NONE Performed at [facility], [address] [place], [place] [address]    Gram Stain   Final    ABUNDANT WBC PRESENT, PREDOMINANTLY PMN FEW GRAM POSITIVE COCCI IN PAIRS    Culture   Final    FEW STAPHYLOCOCCUS AUREUS NO ANAEROBES ISOLATED Performed at [facility], [place] [address], [place], [place] [address]    Report Status 05/27/2022 FINAL  Final   Organism ID, Bacteria STAPHYLOCOCCUS AUREUS  Final      Susceptibility   Staphylococcus aureus - MIC*    CIPROFLOXACIN <=0.5 SENSITIVE Sensitive     ERYTHROMYCIN <=0.25 SENSITIVE Sensitive     GENTAMICIN <=0.5 SENSITIVE Sensitive     OXACILLIN <=0.25 SENSITIVE Sensitive     TETRACYCLINE <=1 SENSITIVE Sensitive     VANCOMYCIN <=0.5 SENSITIVE Sensitive     TRIMETH/SULFA <=10 SENSITIVE Sensitive     CLINDAMYCIN <=0.25 SENSITIVE Sensitive     RIFAMPIN <=0.5 SENSITIVE Sensitive     Inducible Clindamycin NEGATIVE Sensitive     * FEW STAPHYLOCOCCUS AUREUS  Culture, blood (Routine X 2) w Reflex to ID Panel     Status: None (Preliminary result)   Collection Time: 05/26/22  9:05 AM   Specimen: BLOOD RIGHT HAND  Result Value Ref Range Status   Specimen Description BLOOD RIGHT HAND  Final   Special Requests   Final    BOTTLES DRAWN AEROBIC AND ANAEROBIC Blood Culture adequate volume   Culture   Final    NO GROWTH 4 DAYS Performed at [facility], [address] [place], [place] [address]    Report Status PENDING  Incomplete  Culture, blood (Routine X 2) w Reflex to ID Panel     Status: None (Preliminary result)   Collection Time: 05/26/22  9:12 AM   Specimen: BLOOD RIGHT FOREARM  Result Value Ref Range Status   Specimen Description BLOOD RIGHT FOREARM  Final   Special Requests   Final    BOTTLES DRAWN AEROBIC AND ANAEROBIC Blood  Culture adequate volume   Culture   Final    NO GROWTH 4 DAYS Performed at [facility] [address] [place], [place] [address]    Report Status PENDING  Incomplete         Radiology Studies: DG Lumbar Spine 2-3 Views  Result Date: 05/10/2022 CLINICAL DATA:  Fall EXAM: LUMBAR SPINE - 2-3 VIEW COMPARISON:  CT 05/15/2022 FINDINGS: There are 5 non-rib-bearing lumbar vertebrae. There is a chronic compression fracture of L1 and and acute-subacute compression fracture of L3. There is multilevel degenerative disc disease, worst at L4-L5 and L5-S1 with moderate and severe disc height loss respectively. There is moderate lower lumbar predominant facet arthropathy. Partially visualized right hip arthroplasty. IMPRESSION: Acute-subacute compression fracture of L3 with up to 50% height loss. Chronic compression deformity of L1 without evidence of progressive height loss. Multilevel degenerative disc disease, moderate at L4-L5 and severe at L5-S1. Moderate lower lumbar predominant facet arthropathy. Electronically Signed   By: Maurine Simmering M.D.   On: 05/14/2022 17:09   CT ABDOMEN PELVIS W CONTRAST  Result Date: 05/09/2022 CLINICAL DATA:  Pancreatic cancer. Blunt abdominal trauma. Patient found down on floor incontinent to urine. Recent diagnosis of sepsis. *  Tracking Code: BO * EXAM: CT ABDOMEN AND PELVIS WITH CONTRAST TECHNIQUE: Multidetector CT imaging of the abdomen and pelvis was performed using the standard protocol following bolus administration of intravenous contrast. RADIATION DOSE REDUCTION: This exam was performed according to the departmental dose-optimization program which includes automated exposure control, adjustment of the mA and/or kV according to patient size and/or use of iterative reconstruction technique. CONTRAST:  118m OMNIPAQUE IOHEXOL 300 MG/ML  SOLN COMPARISON:  Radiographs from 05/23/2022 and CT scan 05/20/2021 FINDINGS: Lower chest: Mild dependent atelectasis or  scarring. Hepatobiliary: 4 new hypodense lesions in the right hepatic lobe are identified, probably metastatic lesions. The largest measures 1.9 by 1.6 cm in segment 7 of the liver (image 14, series 2). Gallbladder unremarkable. No biliary dilatation. Pancreas: Substantial enlargement of the hypoenhancing infiltrative pancreatic mass, currently 6.6 by 4.2 cm on image 25 of series 2 and previously about 2.7 by 2.0 cm on 05/20/2021. This mass abuts portions of the splenic artery, the junction of the SMV and the portal vein, and nearly occludes the splenic vein as shown for example on image 49 series 5. Spleen: Trace perisplenic ascites, otherwise unremarkable. Adrenals/Urinary Tract: Both adrenal glands appear normal. Stable small renal cysts warrant no further follow up. Subtle parenchymal hypoenhancement posteriorly in the right kidney upper pole on image 32 series 2, correlate with urine analysis in assessing for possible pyelonephritis. Distended urinary bladder. Stomach/Bowel: Unremarkable Vascular/Lymphatic: Vascular involvement of the enlarging pancreatic mass as noted above. Atherosclerosis is present, including aortoiliac atherosclerotic disease. Reproductive: Prostatomegaly. Other: No supplemental non-categorized findings. Musculoskeletal: Right total hip prosthesis noted. Acute or early subacute fracture of the L3 vertebral body with involvement of the superior endplate and extension to the middle column, with 1-2 mm posterior bony retropulsion. We demonstrate nearly 50% loss of vertebral body height and some mild anterior fragmentation of the vertebral rim along with paraspinal edema/mild paraspinal hematoma. There is also a chronic compression fracture at the L4 vertebral level. Multilevel lumbar spondylosis and degenerative disc disease leading to multilevel impingement. Small umbilical hernia containing adipose tissue. Somewhat prominent piriformis and obturator internus musculature in the pelvis, left  greater than right, cannot exclude mild sciatic notch impingement due to the left piriformis hypertrophy. IMPRESSION: 1. Substantial enlargement of the hypoenhancing infiltrative pancreatic mass, currently 6.6 by 4.2 cm, previously about 2.7 by 2.0 cm on 05/20/2021. This mass abuts portions of the splenic artery, the junction of the SMV and portal vein, and nearly occludes the splenic vein. 2. 4 new hypodense lesions in the right hepatic lobe are probably metastatic lesions. 3. Acute or early subacute fracture of the L3 vertebral body with involvement of the superior endplate and extension to the middle column, with nearly 50% loss of vertebral body height and some mild anterior fragmentation of the vertebral rim. 1-2 mm posterior bony retropulsion. Paraspinal edema noted. 4. Subtle parenchymal hypoenhancement posteriorly in the right kidney upper pole, correlate with urine analysis in assessing for possible pyelonephritis. 5. Distended urinary bladder. 6. Prostatomegaly. 7. Somewhat prominent piriformis and obturator internus musculature in the pelvis, left greater than right, cannot exclude left sciatic notch impingement due to the left piriformis hypertrophy. 8. Multilevel lumbar spondylosis and degenerative disc disease leading to multilevel impingement. 9. Small umbilical hernia containing adipose tissue. 10. Aortic atherosclerosis. Aortic Atherosclerosis (ICD10-I70.0). Electronically Signed   By: WVan ClinesM.D.   On: 05/27/2022 16:41   DG Hip Unilat W or Wo Pelvis 2-3 Views Left  Result Date: 05/27/2022 CLINICAL DATA:  Fall.  Evaluate for fracture. EXAM: DG HIP (WITH OR WITHOUT PELVIS) 2-3V LEFT COMPARISON:  Right hip radiographs 05/21/2021, pelvis and right hip radiographs 05/20/2021 FINDINGS: Status post total right hip arthroplasty. There is diffuse decreased bone mineralization. No perihardware lucency is seen to indicate radiographic evidence of hardware failure. Mild superolateral left  acetabular degenerative osteophytosis. The bilateral sacroiliac, left femoroacetabular, and pubic symphysis joint spaces are maintained. Small sclerotic focus within the left ilium is unchanged from 05/20/2021, compatible with a benign bone island. No acute fracture is seen.  No dislocation. Moderate to severe right L4-5 disc space narrowing and right lateral endplate osteophytes. 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 No acute fracture is seen. Electronically Signed   By: Yvonne Kendall M.D.   On: 05/02/2022 11:32   DG Chest Port 1 View  Result Date: 05/14/2022 CLINICAL DATA:  Sepsis EXAM: PORTABLE CHEST 1 VIEW COMPARISON:  05/20/2021 FINDINGS: Right IJ approach chest port in place with distal tip at the level of the distal SVC. The heart size and mediastinal contours are within normal limits. Aortic atherosclerosis. Both lungs are clear. The visualized skeletal structures are unremarkable. IMPRESSION: No active disease. Electronically Signed   By: Davina Poke D.O.   On: 05/01/2022 11:30   DG Shoulder Left  Result Date: 05/07/2022 CLINICAL DATA:  Fall.  Evaluate for fracture. EXAM: LEFT SHOULDER - 2+ VIEW COMPARISON:  None Available. FINDINGS: Severe acromioclavicular joint space narrowing with mild-to-moderate peripheral degenerative osteophytes. Mild glenohumeral joint space narrowing. Mild peripheral glenoid degenerative osteophytosis. No acute fracture is seen. No dislocation. The visualized portion of the left lung is unremarkable. IMPRESSION: 1. No acute fracture. 2. Moderate acromioclavicular and mild glenohumeral osteoarthritis. Electronically Signed   By: Yvonne Kendall M.D.   On: 05/03/2022 11:29   CT HEAD WO CONTRAST (5MM)  Result Date: 05/27/2022 CLINICAL DATA:  Head trauma, minor (Age >= 65y); Neck trauma (Age >= 65y) EXAM: CT HEAD WITHOUT CONTRAST CT CERVICAL SPINE WITHOUT CONTRAST TECHNIQUE: Multidetector CT imaging of the head and  cervical spine was performed following the standard protocol without intravenous contrast. Multiplanar CT image reconstructions of the cervical spine were also generated. RADIATION DOSE REDUCTION: This exam was performed according to the departmental dose-optimization program which includes automated exposure control, adjustment of the mA and/or kV according to patient size and/or use of iterative reconstruction technique. COMPARISON:  None Available. FINDINGS: CT HEAD FINDINGS Brain: No evidence of acute infarction, hemorrhage, hydrocephalus, extra-axial collection or mass lesion/mass effect. Vascular: No hyperdense vessel identified. Skull: No acute fracture.  Left forehead contusion. Sinuses/Orbits: Paranasal sinus mucosal thickening. Other: No mastoid effusions. CT CERVICAL SPINE FINDINGS Alignment: Straightening.  No substantial sagittal subluxation. Skull base and vertebrae: Vertebral heights are maintained. No evidence of acute fracture. Soft tissues and spinal canal: No prevertebral fluid or swelling. No visible canal hematoma. Disc levels: Multilevel facet and uncovertebral hypertrophy there degrees neural foraminal stenosis. Multilevel degenerative disease. Upper chest: Visualized lung apices are clear. IMPRESSION: 1. No evidence of acute intracranial abnormality. 2. No evidence of acute fracture or traumatic malalignment in the cervical spine. Electronically Signed   By: Margaretha Sheffield M.D.   On: 05/26/2022 11:23   CT Cervical Spine Wo Contrast  Result Date: 05/04/2022 CLINICAL DATA:  Head trauma, minor (Age >= 65y); Neck trauma (Age >= 65y) EXAM: CT HEAD WITHOUT CONTRAST CT CERVICAL SPINE WITHOUT CONTRAST TECHNIQUE: Multidetector CT imaging of the head and cervical spine was performed following the standard protocol without intravenous  contrast. Multiplanar CT image reconstructions of the cervical spine were also generated. RADIATION DOSE REDUCTION: This exam was performed according to the  departmental dose-optimization program which includes automated exposure control, adjustment of the mA and/or kV according to patient size and/or use of iterative reconstruction technique. COMPARISON:  None Available. FINDINGS: CT HEAD FINDINGS Brain: No evidence of acute infarction, hemorrhage, hydrocephalus, extra-axial collection or mass lesion/mass effect. Vascular: No hyperdense vessel identified. Skull: No acute fracture.  Left forehead contusion. Sinuses/Orbits: Paranasal sinus mucosal thickening. Other: No mastoid effusions. CT CERVICAL SPINE FINDINGS Alignment: Straightening.  No substantial sagittal subluxation. Skull base and vertebrae: Vertebral heights are maintained. No evidence of acute fracture. Soft tissues and spinal canal: No prevertebral fluid or swelling. No visible canal hematoma. Disc levels: Multilevel facet and uncovertebral hypertrophy there degrees neural foraminal stenosis. Multilevel degenerative disease. Upper chest: Visualized lung apices are clear. IMPRESSION: 1. No evidence of acute intracranial abnormality. 2. No evidence of acute fracture or traumatic malalignment in the cervical spine. Electronically Signed   By: Margaretha Sheffield M.D.   On: 05/27/2022 11:23            LOS: 14 days       Emeterio Reeve, DO [facility] 05/30/2022, 1:34 PM    Dictation software may have been used to generate the above note. Typos may occur and escape review in typed/dictated notes. Please contact Dr Sheppard Coil directly for clarity if needed.  Staff may message me via secure chat in [place]  but this may not receive an immediate response,  please page me for urgent matters!  If 7PM-7AM, please contact night coverage [email or web address]

## 2022-05-30 DEATH — deceased

## 2022-05-31 DIAGNOSIS — Z515 Encounter for palliative care: Secondary | ICD-10-CM | POA: Diagnosis not present

## 2022-05-31 LAB — CULTURE, BLOOD (ROUTINE X 2)
Culture: NO GROWTH
Culture: NO GROWTH
Special Requests: ADEQUATE
Special Requests: ADEQUATE

## 2022-06-06 LAB — BLOOD GAS, ARTERIAL
Acid-Base Excess: 0.9 mmol/L (ref 0.0–2.0)
Bicarbonate: 24 mmol/L (ref 20.0–28.0)
O2 Saturation: 95.4 %
Patient temperature: 37
pCO2 arterial: 33 mmHg (ref 32–48)
pH, Arterial: 7.47 — ABNORMAL HIGH (ref 7.35–7.45)
pO2, Arterial: 70 mmHg — ABNORMAL LOW (ref 83–108)

## 2022-06-30 NOTE — Progress Notes (Signed)
Received report from day RN regarding patient being DNR and and comfort measures. At around 1915, I was alerted by CNA to assess patient. No visible rise and fall of chest noted. Patient unresponsive, unable to detect/appreciate pulse/heart rate. This RN and another RN pronounced patient death at 71 as per existing order "RN may pronounce". Charge Nurse, Investment banker, operational, On call Hospitalist was made aware. [facility] was notified, was advised to carry out eye preservation measures. Next of [place] notified, funeral home was also notified. Post mortem care and checklist completed.   Next of kin, sister, [name] is at bedside.

## 2022-06-30 NOTE — Progress Notes (Signed)
       CROSS COVER NOTE  NAME: Pedro Mccoy MRN: [id number] DOB : [birth year]    Date of Service   06-13-22  HPI/Events of Note   72 year old male with past medical history of metastatic pancreatic cancer on chemotherapy, hypertension, diabetes mellitus, schizophrenia and tardive dyskinesia who was admitted to the hospital with septic shock from pyelonephritis, L3 vertebral body fracture and rhabdomyolysis complicated by atrial flutter, ongoing fevers and acute renal failure leading to comfort measures only for goals of care.  Death was pronounced this evening by 2  nurses as ordered. Time of death 1925PM, today, 2022-06-13       [name] NP [facility]

## 2022-06-30 NOTE — Progress Notes (Signed)
PROGRESS NOTE    Pedro Mccoy   EUM:[id number] DOB: 1950/11/01  DOA: 05/11/2022 Date of Service: 06-09-22 PCP: Romualdo Bolk, FNP     Brief Narrative / Hospital Course:  72 year old male with past medical history of metastatic pancreatic cancer on chemotherapy, hypertension, diabetes mellitus, schizophrenia and tardive dyskinesia presented to the emergency room on 11/17 with complaints of weakness, fall x2 earlier that day and abdominal pain.  Work-up with septic shock secondary to pyelonephritis, acute versus subacute fracture of L3 vertebral body and rhabdomyolysis with CPK of 22,000.  Patient admitted to the hospitalist service and started on IV fluids and antibiotics.    Following admission, on the evening of 11/17, patient went to rapid atrial flutter.  Attempted treatment with metoprolol and IV fluids.   11/19: tachycardia persisting and patient started on Cardizem gtt. Urine cultures positive for MSSA. 11/20: HR better, remained on cardizem gtt  11/21: off Cardizem gtt, on oral metoprolol and cardizem. ID saw pt for (+)UCx MSSA and cocnern for CT abd/pelvis w/ paraspinal edema, 1 set BCx (+) likely contaminant, advised consider MRI L spine.  11/23: White count continues to rise. No fevers. MRI L spine: L2L3 discitis noted, epidural abscess. Large psoas abscess. 11/24:  to IR, psoas abscess drained 11/25: White count downtrending. Neurosurgery consult - no surgical intervention planned d/t high risk, consider stabilization but may not be appropriate given metastatic disease  11/26: developed AKI Cr 1.15 --> 5.40 and metabolic encephalopathy.  11/28: Cr continues to worsen to 3.16, consulted nephrology 11/29: remains tachycardic in 120s, Cr 4.05, WBC up. Sister and patient agree for DNR status. Deferring dialysis at this time per nephrology. Holding diuresis. Hypotensive pm and overnight 11/30: remains low BP, night coverage spoke w/ sister re: pressors, no pressors. Remains low  today. Cr worse again 4.93. Nephrology s/o, no dialysis. Spoke in person w/ sister later I the day, see IPAL note. Discussion of options - decision was made for comfort measures only  12/01-12/02: remains on comfort measures, resting and in no distress   Overall goals (see palliative notes for details): transfer to nursing facility, treat the treatable, "would not want to live on a machine or feeding tube," DNR as of 11/29. Confirmed on phone w/ sister.   Consultants:  Infectious Discase  Palliative care Interventional Radiology  Neurosurgery Nephrology  Cardiology for TEE  Procedures: 05/23/22: CT guided drainage L psoas abscess w/ IR       ASSESSMENT & PLAN:   Principal Problem:   Comfort measures only status Active Problems:   Septic shock ([place])   UTI (urinary tract infection)   Acute pyelonephritis   Rhabdomyolysis   Atrial flutter ([facility])   Acute urinary retention   Pancreatic cancer ([place])   HLD (hyperlipidemia)   Lumbar compression fracture, closed, initial encounter ([place])   HTN (hypertension)   Schizophrenia ([place])   Depression with anxiety   Diabetes mellitus without complication ([place])   Abdominal pain   Abnormal LFTs   Fall at home, initial encounter   Lumbar discitis   Vertebral osteomyelitis ([place])   Psoas abscess, left ([facility])   Spinal epidural abscess   AKI (acute kidney injury) ([place])   Acute metabolic encephalopathy   Protein-calorie malnutrition, severe   Comfort measures only  Pain and anxiety control  Frequent assessments for pain/anxiety or other discomfort Call sister if he is deteriorating - she would like to be present if needed / as she is able  Subjective:  Patient has no complaints, no concerns from RN   Family Communication: none at this time    Objective Findings:  Vitals:   05/30/22 0808 05/30/22 1657 05/30/22 1941 11-Jun-2022 0909  BP: 95/61 114/72 119/67 (!) 47/36  Pulse: (!) 123 (!) 127 64 (!)  120  Resp: '16 16 20 18  '$ Temp: 98.4 F (36.9 C) 98.6 F (37 C) 98.1 F (36.7 C) 98.1 F (36.7 C)  TempSrc: Oral   Oral  SpO2: 94% 98% 96% 98%  Weight:      Height:        Intake/Output Summary (Last 24 hours) at Jun 11, 2022 1219 Last data filed at Jun 11, 2022 1023 Gross per 24 hour  Intake 0 ml  Output 700 ml  Net -700 ml   Filed Weights   05/10/2022 1042 05/23/22 0922 05/29/22 2105  Weight: 82.1 kg 82 kg 82.5 kg    Examination:  Constitutional:  General Appearance: NAD Respiratory: Normal respiratory effort       Scheduled Medications:   feeding supplement  1 Container Oral TID BM   FLUoxetine  40 mg Oral Daily   lamoTRIgine  200 mg Oral BID   lidocaine  1 patch Transdermal Q24H   polyethylene glycol  17 g Oral Daily   risperiDONE  1.5 mg Oral QHS    Continuous Infusions:  sodium chloride 20 mL/hr at 05/29/22 1658    PRN Medications:  acetaminophen **OR** acetaminophen, albuterol, antiseptic oral rinse, glycopyrrolate **OR** glycopyrrolate **OR** glycopyrrolate, hydrALAZINE, HYDROmorphone (DILAUDID) injection, LORazepam, LORazepam, ondansetron **OR** ondansetron (ZOFRAN) IV, mouth rinse, oxyCODONE, polyvinyl alcohol       LOS: 15 days       Emeterio Reeve, DO [facility] 06/11/22, 12:19 PM    Dictation software may have been used to generate the above note. Typos may occur and escape review in typed/dictated notes. Please contact Dr Sheppard Coil directly for clarity if needed.  Staff may message me via secure chat in [place]  but this may not receive an immediate response,  please page me for urgent matters!  If 7PM-7AM, please contact night coverage [email or web address]

## 2022-06-30 NOTE — Death Summary Note (Signed)
DEATH SUMMARY   Patient Details  Name: Pedro Mccoy MRN: [id number] DOB: 09-02-1950 QIW:[id number], Wonda Cheng, FNP Admission/Discharge Information   Admit Date:  11-Jun-2022  Date of Death: Date of Death: 06/26/2022  Time of Death: Time of Death: 10/06/1923  Length of Stay: 15   Principle Cause of death: end stage renal failure as result of pyelonephritis / rhabdomyelitis / septic shock, underlying pancreatic cancer   Hospital Diagnoses: Principal Problem:   Comfort measures only status Active Problems:   Septic shock ([place])   UTI (urinary tract infection)   Acute pyelonephritis   Rhabdomyolysis   Atrial flutter ([place])   Acute urinary retention   Pancreatic cancer ([place])   HLD (hyperlipidemia)   Lumbar compression fracture, closed, initial encounter ([place])   HTN (hypertension)   Schizophrenia ([place])   Depression with anxiety   Diabetes mellitus without complication ([place])   Abdominal pain   Abnormal LFTs   Fall at home, initial encounter   Lumbar discitis   Vertebral osteomyelitis ([place])   Psoas abscess, left ([place])   Spinal epidural abscess   AKI (acute kidney injury) ([place])   Acute metabolic encephalopathy   Protein-calorie malnutrition, severe   Hospital Course: 72 year old male with past medical history of metastatic pancreatic cancer on chemotherapy, hypertension, diabetes mellitus, schizophrenia and tardive dyskinesia presented to the emergency room on June 12, 2023 with complaints of weakness, fall x2 earlier that day and abdominal pain.  Work-up with septic shock secondary to pyelonephritis, acute versus subacute fracture of L3 vertebral body and rhabdomyolysis with CPK of 22,000.  Patient admitted to the hospitalist service and started on IV fluids and antibiotics.    Following admission, on the evening of 06/12/2023, patient went to rapid atrial flutter.  Attempted treatment with metoprolol and IV fluids.   11/19: tachycardia persisting and patient started on Cardizem gtt. Urine cultures  positive for MSSA. 11/20: HR better, remained on cardizem gtt  11/21: off Cardizem gtt, on oral metoprolol and cardizem. ID saw pt for (+)UCx MSSA and cocnern for CT abd/pelvis w/ paraspinal edema, 1 set BCx (+) likely contaminant, advised consider MRI L spine.  11/23: White count continues to rise. No fevers. MRI L spine: L2L3 discitis noted, epidural abscess. Large psoas abscess. 11/24:  to IR, psoas abscess drained 11/25: White count downtrending. Neurosurgery consult - no surgical intervention planned d/t high risk, consider stabilization but may not be appropriate given metastatic disease  11/26: developed AKI Cr 1.15 --> 1.19 and metabolic encephalopathy.  11/28: Cr continues to worsen to 3.16, consulted nephrology 11/29: remains tachycardic in 120s, Cr 4.05, WBC up. Sister and patient agree for DNR status. Deferring dialysis at this time per nephrology. Holding diuresis. Hypotensive pm and overnight 11/30: remains low BP, night coverage spoke w/ sister re: pressors, no pressors. Remains low today. Cr worse again 4.93. Nephrology s/o, no dialysis. Spoke in person w/ sister later I the day, see IPAL note. Discussion of options - decision was made for comfort measures only  12/01-12/02: remains on comfort measures, resting and in no distress   Overall goals (see palliative notes for details): transfer to nursing facility, treat the treatable, "would not want to live on a machine or feeding tube," DNR as of 11/29. Confirmed on phone w/ sister.   Consultants:  Infectious Discase  Palliative care Interventional Radiology  Neurosurgery Nephrology  Cardiology for TEE  Procedures: 05/23/22: CT guided drainage L psoas abscess w/ IR       ASSESSMENT & PLAN:   Principal Problem:  Comfort measures only status Active Problems:   Septic shock ([facility])   UTI (urinary tract infection)   Acute pyelonephritis   Rhabdomyolysis   Atrial flutter ([facility])   Acute urinary retention   Pancreatic  cancer ([place])   HLD (hyperlipidemia)   Lumbar compression fracture, closed, initial encounter ([place])   HTN (hypertension)   Schizophrenia ([place])   Depression with anxiety   Diabetes mellitus without complication ([place])   Abdominal pain   Abnormal LFTs   Fall at home, initial encounter   Lumbar discitis   Vertebral osteomyelitis ([place])   Psoas abscess, left ([facility])   Spinal epidural abscess   AKI (acute kidney injury) ([place])   Acute metabolic encephalopathy   Protein-calorie malnutrition, severe   Comfort measures only  Pain and anxiety control  Frequent assessments for pain/anxiety or other discomfort Call sister if he is deteriorating - she would like to be present if needed / as she is able             Assessment and Plan - other : Septic shock ([place]) Hypotension L2-L3 osteomyelitis with epidural abscess Left psoas abscess S/p CT-guided drainage of left psoas abscess on 05/23/2022 Continue IV cefazolin. No growth on blood cultures.  Urine culture with Staph aureus.   MRI spine with osteo/abscess Fluid culture from psoas abscess showed Staph aureus. DO NOT access port Plan for TEE but this has been put on hold because of patient's current condition Fluid resuscitating as able, sister has declined pressors, will call her today as well to update     AKI - worsening  Creatinine has gotten worse, went from 2.31 --> 3.16 --> 4.05 --> 4.9 today 05/29/22.   Renal ultrasound no evidence of obstructive uropathy.   Continue IV fluids  monitor BMP Nephrology following - no dialysis at this time - have signed off as of today 96/04/54    Acute metabolic encephalopathy - improved CT head no acute abnormality.  No acidosis or hypercapnia on ABG.   Procalcitonin and lactic acid were unremarkable.   No growth thus far on repeat blood cultures from 05/26/2022.   UA is still abnormal but he has a Foley catheter in place.   Chest x-ray showed left lower lung opacities which may  represent atelectasis, pneumonia or perhaps a small left pleural effusion.   Continue IV antibiotics   Rhabdomyolysis Improved   Acute pyelonephritis Urine cultures positive for MSSA.   He is on IV Ancef. ID following    Atrial flutter ([place]), sinus tachycardia Weaned off IV Cardizem drip.   Continue oral metoprolol, Cardizem and Eliquis. 11/29 Metoprolol and cardizem doses were lowered, placed on holding parameters d/t low BP      Acute urinary retention Foley catheter placed on 05/18/2022   Lumbar compression fracture, closed, initial encounter ([place]) Unclear whether this is compression fracture or pathologic in the setting of discitis.   patient is not a surgical candidate per Dr. Izora Ribas, neurosurgeon.   LSO brace ordered for patient comfort.   Pancreatic cancer ([place]) Metastatic mets to the liver.   Oncology following with recent chemotherapy.  Sees onc [facility].   Next chemo scheduled for 11/30.  Poor functional status. Outpatient follow-up with oncologist and palliative care     Other comorbidities include schizophrenia, hypertension, type II DM       The results of significant diagnostics from this hospitalization (including imaging, microbiology, ancillary and laboratory) are listed below for reference.   Significant Diagnostic Studies: US RENAL  Result  Date: 05/27/2022 CLINICAL DATA:  Acute kidney injury EXAM: RENAL / URINARY TRACT ULTRASOUND COMPLETE COMPARISON:  05/05/2022 FINDINGS: Right Kidney: Renal measurements: 12.6 x 5.4 x 6.2 cm = volume: 220 mL. Echogenicity within normal limits. Small right renal cysts which do not require follow-up imaging. No mass or hydronephrosis visualized. Left Kidney: Renal measurements: 13.2 x 5.9 x 6.4 cm = volume: 259 mL. Echogenicity within normal limits. No mass or hydronephrosis visualized. Bladder: Decompressed by Foley catheter. Other: None. IMPRESSION: No evidence of obstructive uropathy. Electronically Signed   By:  Davina Poke D.O.   On: 05/27/2022 09:50   CT HEAD WO CONTRAST (5MM)  Result Date: 05/26/2022 CLINICAL DATA:  Mental status change, unknown cause. EXAM: CT HEAD WITHOUT CONTRAST TECHNIQUE: Contiguous axial images were obtained from the base of the skull through the vertex without intravenous contrast. RADIATION DOSE REDUCTION: This exam was performed according to the departmental dose-optimization program which includes automated exposure control, adjustment of the mA and/or kV according to patient size and/or use of iterative reconstruction technique. COMPARISON:  05/25/2022 FINDINGS: Brain: Age related generalized atrophy. No focal abnormality seen affecting the brainstem or cerebellum. There are probably mild chronic small-vessel ischemic changes of the cerebral hemispheric white matter. No evidence of recent focal infarction, mass lesion, hemorrhage, hydrocephalus or extra-axial collection. Chronic ventriculomegaly appears stable, likely due to central volume loss. Vascular: No abnormal vascular finding. Skull: Negative Sinuses/Orbits: Clear/normal Other: None IMPRESSION: No acute CT finding. Generalized atrophy. Mild chronic small-vessel ischemic changes of the cerebral hemispheric white matter. Chronic ventriculomegaly, likely due to central volume loss. Stable examination since 10 days ago. Electronically Signed   By: Nelson Chimes M.D.   On: 05/26/2022 10:25   DG Chest Port 1 View  Result Date: 05/26/2022 CLINICAL DATA:  Altered mental status. EXAM: PORTABLE CHEST 1 VIEW COMPARISON:  AP chest 05/17/2022 and w11/21/2022 FINDINGS: Right chest wall porta catheter tip again overlies the superior vena cava/right atrial junction. Cardiac silhouette and mediastinal contours are within normal limits. Mild-to-moderate calcification within the aortic arch. Left lower lung heterogeneous and linear opacities are new from 05/09/2022. No right pleural effusion. No pneumothorax is seen. Mild dextrocurvature of  the mid to upper thoracic spine with mild multilevel degenerative disc changes. IMPRESSION: Left lower lung heterogeneous and linear opacities are new from 05/24/2022 and may represent atelectasis or pneumonia. It is difficult to exclude a small left pleural effusion. Electronically Signed   By: Yvonne Kendall M.D.   On: 05/26/2022 09:02   CT ASPIRATION N/S  Result Date: 05/23/2022 INDICATION: DISCITIS WITH LEFT PSOAS SCATTERED SMALL ABSCESSES EXAM: ULTRASOUND ASPIRATION SMALL LEFT PSOAS ABSCESS MEDICATIONS: The patient is currently admitted to the hospital and receiving intravenous antibiotics. The antibiotics were administered within an appropriate time frame prior to the initiation of the procedure. ANESTHESIA/SEDATION: Total intra-service moderate Sedation Time: NONE. The patient's level of consciousness and vital signs were monitored continuously by radiology nursing throughout the procedure under my direct supervision. COMPLICATIONS: None immediate. PROCEDURE: Informed written consent was obtained from the patient after a thorough discussion of the procedural risks, benefits and alternatives. All questions were addressed. Maximal Sterile Barrier Technique was utilized including caps, mask, sterile gowns, sterile gloves, sterile drape, hand hygiene and skin antiseptic. A timeout was performed prior to the initiation of the procedure. Previous imaging reviewed. Patient positioned prone. Noncontrast localization CT performed. One of the small left psoas abscess was localized and marked for a posterior approach. Under sterile conditions and local anesthesia, an 18 gauge  15 cm access was advanced into a small left psoas abscess. Needle position confirmed with CT. Syringe aspiration yielded 10 cc blood tinged purulent fluid. Sample sent for culture. Needle removed. Patient tolerated procedure well. No immediate complication. IMPRESSION: Successful CT-guided left psoas abscess aspiration. Electronically Signed    By: Jerilynn Mages.  Shick M.D.   On: 05/23/2022 10:32   MR THORACIC SPINE W WO CONTRAST  Result Date: 05/22/2022 CLINICAL DATA:  Mid and lower back pain. EXAM: MRI THORACIC AND LUMBAR SPINE WITHOUT AND WITH CONTRAST TECHNIQUE: Multiplanar and multiecho pulse sequences of the thoracic and lumbar spine were obtained without and with intravenous contrast. CONTRAST:  89m GADAVIST GADOBUTROL 1 MMOL/ML IV SOLN COMPARISON:  CT scan 05/25/2022 FINDINGS: MRI THORACIC SPINE FINDINGS Alignment:  Normal Vertebrae: Normal marrow signal. No bone lesions or fractures. Benign scattered hemangiomas are noted. Cord: Normal cord signal intensity. No cord lesions cord enhancement. Paraspinal and other soft tissues: Bilateral pleural effusions. Disc levels: No thoracic disc protrusions spinal or foraminal stenosis. No findings to suggest discitis or osteomyelitis. MRI LUMBAR SPINE FINDINGS Segmentation: There are five lumbar type vertebral bodies. The last full intervertebral disc space is labeled L5-S1. Alignment:  Normal Vertebrae: Remote L1 compression fracture. Benign L2 hemangioma. Discitis at L2-3 with osteomyelitis involving L2 and L3. Associated epidural abscess extending from the midbody of L2 down to the bottom of L4. Conus medullaris: Extends to the T12-L1 level and appears normal. Paraspinal and other soft tissues: Large complex left psoas abscess extending from the upper pelvis to the L2 level. Disc levels: T12-L1: Mild retropulsion L1 but no canal compromise. Mild bilateral lateral recess encroachment. L2-3: Mild to moderate facet disease bulging annulus with bilateral lateral recess stenosis. L2-3: Discitis with a fluid-filled disc space and associated osteomyelitis involving the inferior aspect of L2 and most of the L3 vertebral body which demonstrates a pathologic compression fracture. Associated moderate-sized epidural abscess which is creating mass effect on the ventral thecal sac. L3-4: Bulging degenerated annulus epidural  abscess and facet disease contributing to moderate to moderately severe spinal and bilateral lateral recess stenosis. L4-5: Broad-based bulging annulus and central disc protrusion in combination with facet disease and ligamentum flavum thickening contributing to moderate spinal and bilateral lateral recess stenosis. There is also mild bilateral foraminal stenosis. L5-S1: Central disc protrusion and facet disease with mass effect the ventral thecal sac and possible irritation of both S1 nerve roots right greater than left. Mild bilateral foraminal stenosis. IMPRESSION: 1. 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 Mild pathologic compression fracture of L3. Normal normal associated epidural abscess extending from the midbody of L2 down to the bottom of L4. 2. Large complex left psoas abscess extending from the upper pelvis to the L2 level. 3. Remote L1 compression fracture. 4. No findings for discitis or osteomyelitis in the thoracic spine 5. Mild bilateral lateral recess encroachment at T12-L1 and L1-2. 6. 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 These results will be called to the ordering clinician or representative by the Radiologist Assistant, and communication documented in the PACS or [employer] Electronically Signed   By: PMarijo SanesM.D.   On: 05/22/2022 17:08   MR Lumbar Spine W Wo Contrast  Result Date: 05/22/2022 CLINICAL DATA:  Mid and lower back pain. EXAM: MRI THORACIC AND LUMBAR SPINE WITHOUT AND WITH CONTRAST TECHNIQUE: Multiplanar and multiecho pulse sequences of the thoracic and lumbar spine were obtained  without and with intravenous contrast. CONTRAST:  62m GADAVIST GADOBUTROL 1 MMOL/ML IV SOLN COMPARISON:  CT scan 05/12/2022 FINDINGS: MRI THORACIC SPINE FINDINGS Alignment:  Normal Vertebrae: Normal marrow signal. No bone lesions or fractures. Benign scattered  hemangiomas are noted. Cord: Normal cord signal intensity. No cord lesions cord enhancement. Paraspinal and other soft tissues: Bilateral pleural effusions. Disc levels: No thoracic disc protrusions spinal or foraminal stenosis. No findings to suggest discitis or osteomyelitis. MRI LUMBAR SPINE FINDINGS Segmentation: There are five lumbar type vertebral bodies. The last full intervertebral disc space is labeled L5-S1. Alignment:  Normal Vertebrae: Remote L1 compression fracture. Benign L2 hemangioma. Discitis at L2-3 with osteomyelitis involving L2 and L3. Associated epidural abscess extending from the midbody of L2 down to the bottom of L4. Conus medullaris: Extends to the T12-L1 level and appears normal. Paraspinal and other soft tissues: Large complex left psoas abscess extending from the upper pelvis to the L2 level. Disc levels: T12-L1: Mild retropulsion L1 but no canal compromise. Mild bilateral lateral recess encroachment. L2-3: Mild to moderate facet disease bulging annulus with bilateral lateral recess stenosis. L2-3: Discitis with a fluid-filled disc space and associated osteomyelitis involving the inferior aspect of L2 and most of the L3 vertebral body which demonstrates a pathologic compression fracture. Associated moderate-sized epidural abscess which is creating mass effect on the ventral thecal sac. L3-4: Bulging degenerated annulus epidural abscess and facet disease contributing to moderate to moderately severe spinal and bilateral lateral recess stenosis. L4-5: Broad-based bulging annulus and central disc protrusion in combination with facet disease and ligamentum flavum thickening contributing to moderate spinal and bilateral lateral recess stenosis. There is also mild bilateral foraminal stenosis. L5-S1: Central disc protrusion and facet disease with mass effect the ventral thecal sac and possible irritation of both S1 nerve roots right greater than left. Mild bilateral foraminal stenosis.  IMPRESSION: 1. 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 Mild pathologic compression fracture of L3. Normal normal associated epidural abscess extending from the midbody of L2 down to the bottom of L4. 2. Large complex left psoas abscess extending from the upper pelvis to the L2 level. 3. Remote L1 compression fracture. 4. No findings for discitis or osteomyelitis in the thoracic spine 5. Mild bilateral lateral recess encroachment at T12-L1 and L1-2. 6. 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 These results will be called to the ordering clinician or representative by the Radiologist Assistant, and communication documented in the PACS or [employer] Electronically Signed   By: PMarijo SanesM.D.   On: 05/22/2022 17:08   DG Lumbar Spine 2-3 Views  Result Date: 05/14/2022 CLINICAL DATA:  Fall EXAM: LUMBAR SPINE - 2-3 VIEW COMPARISON:  CT 05/29/2022 FINDINGS: There are 5 non-rib-bearing lumbar vertebrae. There is a chronic compression fracture of L1 and and acute-subacute compression fracture of L3. There is multilevel degenerative disc disease, worst at L4-L5 and L5-S1 with moderate and severe disc height loss respectively. There is moderate lower lumbar predominant facet arthropathy. Partially visualized right hip arthroplasty. IMPRESSION: Acute-subacute compression fracture of L3 with up to 50% height loss. Chronic compression deformity of L1 without evidence of progressive height loss. Multilevel degenerative disc disease, moderate at L4-L5 and severe at L5-S1. Moderate lower lumbar predominant facet arthropathy. Electronically Signed   By: JMaurine SimmeringM.D.   On: 05/05/2022 17:09   CT ABDOMEN PELVIS W CONTRAST  Result Date: 05/05/2022 CLINICAL DATA:  Pancreatic cancer. Blunt abdominal trauma. Patient found down on floor incontinent to urine. Recent diagnosis of sepsis. * Tracking  Code: BO * EXAM: CT ABDOMEN AND PELVIS WITH CONTRAST TECHNIQUE: Multidetector CT imaging of the abdomen and pelvis was performed using the standard protocol following bolus administration of intravenous contrast. RADIATION DOSE REDUCTION: This exam was performed according to the departmental dose-optimization program which includes automated exposure control, adjustment of the mA and/or kV according to patient size and/or use of iterative reconstruction technique. CONTRAST:  152m OMNIPAQUE IOHEXOL 300 MG/ML  SOLN COMPARISON:  Radiographs from 05/10/2022 and CT scan 05/20/2021 FINDINGS: Lower chest: Mild dependent atelectasis or scarring. Hepatobiliary: 4 new hypodense lesions in the right hepatic lobe are identified, probably metastatic lesions. The largest measures 1.9 by 1.6 cm in segment 7 of the liver (image 14, series 2). Gallbladder unremarkable. No biliary dilatation. Pancreas: Substantial enlargement of the hypoenhancing infiltrative pancreatic mass, currently 6.6 by 4.2 cm on image 25 of series 2 and previously about 2.7 by 2.0 cm on 05/20/2021. This mass abuts portions of the splenic artery, the junction of the SMV and the portal vein, and nearly occludes the splenic vein as shown for example on image 49 series 5. Spleen: Trace perisplenic ascites, otherwise unremarkable. Adrenals/Urinary Tract: Both adrenal glands appear normal. Stable small renal cysts warrant no further follow up. Subtle parenchymal hypoenhancement posteriorly in the right kidney upper pole on image 32 series 2, correlate with urine analysis in assessing for possible pyelonephritis. Distended urinary bladder. Stomach/Bowel: Unremarkable Vascular/Lymphatic: Vascular involvement of the enlarging pancreatic mass as noted above. Atherosclerosis is present, including aortoiliac atherosclerotic disease. Reproductive: Prostatomegaly. Other: No supplemental non-categorized findings. Musculoskeletal: Right total hip prosthesis noted. Acute or  early subacute fracture of the L3 vertebral body with involvement of the superior endplate and extension to the middle column, with 1-2 mm posterior bony retropulsion. We demonstrate nearly 50% loss of vertebral body height and some mild anterior fragmentation of the vertebral rim along with paraspinal edema/mild paraspinal hematoma. There is also a chronic compression fracture at the L4 vertebral level. Multilevel lumbar spondylosis and degenerative disc disease leading to multilevel impingement. Small umbilical hernia containing adipose tissue. Somewhat prominent piriformis and obturator internus musculature in the pelvis, left greater than right, cannot exclude mild sciatic notch impingement due to the left piriformis hypertrophy. IMPRESSION: 1. Substantial enlargement of the hypoenhancing infiltrative pancreatic mass, currently 6.6 by 4.2 cm, previously about 2.7 by 2.0 cm on 05/20/2021. This mass abuts portions of the splenic artery, the junction of the SMV and portal vein, and nearly occludes the splenic vein. 2. 4 new hypodense lesions in the right hepatic lobe are probably metastatic lesions. 3. Acute or early subacute fracture of the L3 vertebral body with involvement of the superior endplate and extension to the middle column, with nearly 50% loss of vertebral body height and some mild anterior fragmentation of the vertebral rim. 1-2 mm posterior bony retropulsion. Paraspinal edema noted. 4. Subtle parenchymal hypoenhancement posteriorly in the right kidney upper pole, correlate with urine analysis in assessing for possible pyelonephritis. 5. Distended urinary bladder. 6. Prostatomegaly. 7. Somewhat prominent piriformis and obturator internus musculature in the pelvis, left greater than right, cannot exclude left sciatic notch impingement due to the left piriformis hypertrophy. 8. Multilevel lumbar spondylosis and degenerative disc disease leading to multilevel impingement. 9. Small umbilical hernia  containing adipose tissue. 10. Aortic atherosclerosis. Aortic Atherosclerosis (ICD10-I70.0). Electronically Signed   By: WVan ClinesM.D.   On: 05/21/2022 16:41  DG Hip Unilat W or Wo Pelvis 2-3 Views Left  Result Date: 05/27/2022 CLINICAL DATA:  Fall.  Evaluate for fracture. EXAM: DG HIP (WITH OR WITHOUT PELVIS) 2-3V LEFT COMPARISON:  Right hip radiographs 05/21/2021, pelvis and right hip radiographs 05/20/2021 FINDINGS: Status post total right hip arthroplasty. There is diffuse decreased bone mineralization. No perihardware lucency is seen to indicate radiographic evidence of hardware failure. Mild superolateral left acetabular degenerative osteophytosis. The bilateral sacroiliac, left femoroacetabular, and pubic symphysis joint spaces are maintained. Small sclerotic focus within the left ilium is unchanged from 05/20/2021, compatible with a benign bone island. No acute fracture is seen.  No dislocation. Moderate to severe right L4-5 disc space narrowing and right lateral endplate osteophytes. 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 No acute fracture is seen. Electronically Signed   By: Yvonne Kendall M.D.   On: 05/07/2022 11:32   DG Chest Port 1 View  Result Date: 05/27/2022 CLINICAL DATA:  Sepsis EXAM: PORTABLE CHEST 1 VIEW COMPARISON:  05/20/2021 FINDINGS: Right IJ approach chest port in place with distal tip at the level of the distal SVC. The heart size and mediastinal contours are within normal limits. Aortic atherosclerosis. Both lungs are clear. The visualized skeletal structures are unremarkable. IMPRESSION: No active disease. Electronically Signed   By: Davina Poke D.O.   On: 05/19/2022 11:30   DG Shoulder Left  Result Date: 05/11/2022 CLINICAL DATA:  Fall.  Evaluate for fracture. EXAM: LEFT SHOULDER - 2+ VIEW COMPARISON:  None Available. FINDINGS: Severe acromioclavicular joint space narrowing with mild-to-moderate peripheral  degenerative osteophytes. Mild glenohumeral joint space narrowing. Mild peripheral glenoid degenerative osteophytosis. No acute fracture is seen. No dislocation. The visualized portion of the left lung is unremarkable. IMPRESSION: 1. No acute fracture. 2. Moderate acromioclavicular and mild glenohumeral osteoarthritis. Electronically Signed   By: Yvonne Kendall M.D.   On: 04/30/2022 11:29   CT HEAD WO CONTRAST (5MM)  Result Date: 04/30/2022 CLINICAL DATA:  Head trauma, minor (Age >= 65y); Neck trauma (Age >= 65y) EXAM: CT HEAD WITHOUT CONTRAST CT CERVICAL SPINE WITHOUT CONTRAST TECHNIQUE: Multidetector CT imaging of the head and cervical spine was performed following the standard protocol without intravenous contrast. Multiplanar CT image reconstructions of the cervical spine were also generated. RADIATION DOSE REDUCTION: This exam was performed according to the departmental dose-optimization program which includes automated exposure control, adjustment of the mA and/or kV according to patient size and/or use of iterative reconstruction technique. COMPARISON:  None Available. FINDINGS: CT HEAD FINDINGS Brain: No evidence of acute infarction, hemorrhage, hydrocephalus, extra-axial collection or mass lesion/mass effect. Vascular: No hyperdense vessel identified. Skull: No acute fracture.  Left forehead contusion. Sinuses/Orbits: Paranasal sinus mucosal thickening. Other: No mastoid effusions. CT CERVICAL SPINE FINDINGS Alignment: Straightening.  No substantial sagittal subluxation. Skull base and vertebrae: Vertebral heights are maintained. No evidence of acute fracture. Soft tissues and spinal canal: No prevertebral fluid or swelling. No visible canal hematoma. Disc levels: Multilevel facet and uncovertebral hypertrophy there degrees neural foraminal stenosis. Multilevel degenerative disease. Upper chest: Visualized lung apices are clear. IMPRESSION: 1. No evidence of acute intracranial abnormality. 2. No  evidence of acute fracture or traumatic malalignment in the cervical spine. Electronically Signed   By: Margaretha Sheffield M.D.   On: 04/30/2022 11:23   CT Cervical Spine Wo Contrast  Result Date: 05/01/2022 CLINICAL DATA:  Head trauma, minor (Age >= 65y); Neck trauma (Age >= 65y) EXAM: CT HEAD WITHOUT CONTRAST CT CERVICAL SPINE WITHOUT  CONTRAST TECHNIQUE: Multidetector CT imaging of the head and cervical spine was performed following the standard protocol without intravenous contrast. Multiplanar CT image reconstructions of the cervical spine were also generated. RADIATION DOSE REDUCTION: This exam was performed according to the departmental dose-optimization program which includes automated exposure control, adjustment of the mA and/or kV according to patient size and/or use of iterative reconstruction technique. COMPARISON:  None Available. FINDINGS: CT HEAD FINDINGS Brain: No evidence of acute infarction, hemorrhage, hydrocephalus, extra-axial collection or mass lesion/mass effect. Vascular: No hyperdense vessel identified. Skull: No acute fracture.  Left forehead contusion. Sinuses/Orbits: Paranasal sinus mucosal thickening. Other: No mastoid effusions. CT CERVICAL SPINE FINDINGS Alignment: Straightening.  No substantial sagittal subluxation. Skull base and vertebrae: Vertebral heights are maintained. No evidence of acute fracture. Soft tissues and spinal canal: No prevertebral fluid or swelling. No visible canal hematoma. Disc levels: Multilevel facet and uncovertebral hypertrophy there degrees neural foraminal stenosis. Multilevel degenerative disease. Upper chest: Visualized lung apices are clear. IMPRESSION: 1. No evidence of acute intracranial abnormality. 2. No evidence of acute fracture or traumatic malalignment in the cervical spine. Electronically Signed   By: Margaretha Sheffield M.D.   On: 05/21/2022 11:23    Microbiology: Recent Results (from the past 240 hour(s))  Culture, blood (Routine X 2)  w Reflex to ID Panel     Status: None   Collection Time: 05/26/22  9:05 AM   Specimen: BLOOD RIGHT HAND  Result Value Ref Range Status   Specimen Description BLOOD RIGHT HAND  Final   Special Requests   Final    BOTTLES DRAWN AEROBIC AND ANAEROBIC Blood Culture adequate volume   Culture   Final    NO GROWTH 5 DAYS Performed at [facility], [address], [place], [place] [address]    Report Status Jun 18, 2022 FINAL  Final  Culture, blood (Routine X 2) w Reflex to ID Panel     Status: None   Collection Time: 05/26/22  9:12 AM   Specimen: BLOOD RIGHT FOREARM  Result Value Ref Range Status   Specimen Description BLOOD RIGHT FOREARM  Final   Special Requests   Final    BOTTLES DRAWN AEROBIC AND ANAEROBIC Blood Culture adequate volume   Culture   Final    NO GROWTH 5 DAYS Performed at [facility], [address] [place], [place] [address]    Report Status 18-Jun-2022 FINAL  Final    Time spent: 35 minutes  Signed: Emeterio Reeve, DO 06/03/22 1:30 PM

## 2022-06-30 DEATH — deceased
